# Patient Record
Sex: Female | Born: 1945 | Race: White | Hispanic: No | Smoking: Former smoker
Health system: Southern US, Community
[De-identification: ages and names within clinical notes are randomized; demographics above are authoritative.]

## PROBLEM LIST (undated history)

## (undated) DIAGNOSIS — I442 Atrioventricular block, complete: Secondary | ICD-10-CM

## (undated) DIAGNOSIS — K922 Gastrointestinal hemorrhage, unspecified: Secondary | ICD-10-CM

## (undated) DIAGNOSIS — C642 Malignant neoplasm of left kidney, except renal pelvis: Secondary | ICD-10-CM

## (undated) DIAGNOSIS — I213 ST elevation (STEMI) myocardial infarction of unspecified site: Secondary | ICD-10-CM

## (undated) DIAGNOSIS — K921 Melena: Secondary | ICD-10-CM

## (undated) DIAGNOSIS — I255 Ischemic cardiomyopathy: Secondary | ICD-10-CM

## (undated) DIAGNOSIS — M1990 Unspecified osteoarthritis, unspecified site: Secondary | ICD-10-CM

## (undated) DIAGNOSIS — Z950 Presence of cardiac pacemaker: Secondary | ICD-10-CM

## (undated) HISTORY — DX: Melena: K92.1

## (undated) HISTORY — PX: CHOLECYSTECTOMY: SHX55

## (undated) HISTORY — DX: ST elevation (STEMI) myocardial infarction of unspecified site: I21.3

## (undated) HISTORY — DX: Presence of cardiac pacemaker: Z95.0

## (undated) HISTORY — PX: CORONARY ARTERY BYPASS GRAFT: SHX141

## (undated) HISTORY — PX: PACEMAKER INSERTION: SHX728

## (undated) HISTORY — DX: Malignant neoplasm of left kidney, except renal pelvis: C64.2

## (undated) HISTORY — DX: Atrioventricular block, complete: I44.2

## (undated) HISTORY — DX: Unspecified osteoarthritis, unspecified site: M19.90

## (undated) HISTORY — DX: Gastrointestinal hemorrhage, unspecified: K92.2

## (undated) HISTORY — DX: Ischemic cardiomyopathy: I25.5

---

## 2014-02-25 DIAGNOSIS — N2889 Other specified disorders of kidney and ureter: Secondary | ICD-10-CM | POA: Insufficient documentation

## 2014-02-25 DIAGNOSIS — I10 Essential (primary) hypertension: Secondary | ICD-10-CM | POA: Insufficient documentation

## 2015-03-26 HISTORY — PX: TONSILLECTOMY AND ADENOIDECTOMY: SHX28

## 2018-02-11 DIAGNOSIS — E1169 Type 2 diabetes mellitus with other specified complication: Secondary | ICD-10-CM | POA: Insufficient documentation

## 2019-02-15 DIAGNOSIS — I824Y3 Acute embolism and thrombosis of unspecified deep veins of proximal lower extremity, bilateral: Secondary | ICD-10-CM

## 2019-02-15 DIAGNOSIS — I2510 Atherosclerotic heart disease of native coronary artery without angina pectoris: Secondary | ICD-10-CM | POA: Insufficient documentation

## 2019-02-15 DIAGNOSIS — E782 Mixed hyperlipidemia: Secondary | ICD-10-CM | POA: Insufficient documentation

## 2019-02-15 DIAGNOSIS — I82409 Acute embolism and thrombosis of unspecified deep veins of unspecified lower extremity: Secondary | ICD-10-CM

## 2019-02-15 DIAGNOSIS — I10 Essential (primary) hypertension: Secondary | ICD-10-CM

## 2019-02-15 DIAGNOSIS — E785 Hyperlipidemia, unspecified: Secondary | ICD-10-CM

## 2019-02-15 DIAGNOSIS — I2699 Other pulmonary embolism without acute cor pulmonale: Secondary | ICD-10-CM | POA: Insufficient documentation

## 2019-02-15 DIAGNOSIS — Z8673 Personal history of transient ischemic attack (TIA), and cerebral infarction without residual deficits: Secondary | ICD-10-CM

## 2019-02-15 HISTORY — DX: Acute embolism and thrombosis of unspecified deep veins of unspecified lower extremity: I82.409

## 2019-02-15 HISTORY — DX: Hyperlipidemia, unspecified: E78.5

## 2019-02-15 HISTORY — DX: Personal history of transient ischemic attack (TIA), and cerebral infarction without residual deficits: Z86.73

## 2019-02-15 HISTORY — DX: Essential (primary) hypertension: I10

## 2019-02-15 HISTORY — DX: Atherosclerotic heart disease of native coronary artery without angina pectoris: I25.10

## 2019-02-15 HISTORY — DX: Other pulmonary embolism without acute cor pulmonale: I26.99

## 2019-02-16 ENCOUNTER — Encounter: Payer: Self-pay | Admitting: Internal Medicine

## 2019-02-16 ENCOUNTER — Encounter (INDEPENDENT_AMBULATORY_CARE_PROVIDER_SITE_OTHER): Payer: Self-pay

## 2019-02-16 ENCOUNTER — Other Ambulatory Visit: Payer: Self-pay

## 2019-02-16 ENCOUNTER — Other Ambulatory Visit: Payer: Self-pay | Admitting: Internal Medicine

## 2019-02-16 ENCOUNTER — Ambulatory Visit (INDEPENDENT_AMBULATORY_CARE_PROVIDER_SITE_OTHER): Payer: Medicare Other | Admitting: Internal Medicine

## 2019-02-16 DIAGNOSIS — Z950 Presence of cardiac pacemaker: Secondary | ICD-10-CM | POA: Diagnosis not present

## 2019-02-16 DIAGNOSIS — I442 Atrioventricular block, complete: Secondary | ICD-10-CM | POA: Insufficient documentation

## 2019-02-16 MED ORDER — METOPROLOL SUCCINATE ER 50 MG PO TB24: 50.0000 mg | ORAL_TABLET | Freq: Every day | ORAL | 11 refills | Status: DC

## 2019-02-16 MED ORDER — ROSUVASTATIN CALCIUM 40 MG PO TABS: 40.0000 mg | ORAL_TABLET | Freq: Every day | ORAL | 11 refills | Status: DC

## 2019-02-16 MED ORDER — CLOPIDOGREL BISULFATE 75 MG PO TABS: 75.0000 mg | ORAL_TABLET | Freq: Every day | ORAL | 11 refills | Status: DC

## 2019-02-16 MED ORDER — EZETIMIBE 10 MG PO TABS: 10.0000 mg | ORAL_TABLET | Freq: Every day | ORAL | 11 refills | Status: DC

## 2019-02-16 NOTE — Telephone Encounter (Signed)
Pt's medications were sent to pt's pharmacy as requested. Confirmation received.  

## 2019-02-16 NOTE — Patient Instructions (Signed)
Medication Instructions:  Your physician recommends that you continue on your current medications as directed. Please refer to the Current Medication list given to you today.  Labwork: None ordered.  Testing/Procedures: None ordered.  Follow-Up: Your physician wants you to follow-up in: one year with Dr. Lovena Le.   You will receive a reminder letter in the mail two months in advance. If you don't receive a letter, please call our office to schedule the follow-up appointment.  Remote monitoring is used to monitor your Pacemaker from home. This monitoring reduces the number of office visits required to check your device to one time per year. It allows [place] to keep an eye on the functioning of your device to ensure it is working properly. You are scheduled for a device check from home on 05/18/2019. You may send your transmission at any time that day. If you have a wireless device, the transmission will be sent automatically. After your physician reviews your transmission, you will receive a postcard with your next transmission date.  Any Other Special Instructions Will Be Listed Below (If Applicable).  If you need a refill on your cardiac medications before your next appointment, please call your pharmacy.

## 2019-02-16 NOTE — Progress Notes (Signed)
HPI April Lowery is referred today for ongoing PM followup. She has CAD, s/p MI, with LV dysfunction. She has moved from [place] to [place] to "get out of [place]." Her husband died in Jun 01, 2022 and her son in June. She lives with her son and daughter in law in [place]. She denies chest pain or sob. No edema. No syncope.  Allergies  Allergen Reactions  . Penicillins      Current Outpatient Medications  Medication Sig Dispense Refill  . acetaminophen (TYLENOL) 650 MG CR tablet Take 650 mg by mouth every 8 (eight) hours as needed for pain.    . Ascorbic Acid (VITAMIN C PO) Take 1 Dose by mouth daily.    . cholecalciferol (VITAMIN D3) 25 MCG (1000 UT) tablet Take 1,000 Units by mouth daily.    . clopidogrel (PLAVIX) 75 MG tablet Take 75 mg by mouth daily.    . Cyanocobalamin (B-12) 2500 MCG TABS Take 1 tablet by mouth daily.    Marland Kitchen ezetimibe (ZETIA) 10 MG tablet Take 10 mg by mouth daily.    Marland Kitchen lisinopril (ZESTRIL) 5 MG tablet Take 5 mg by mouth daily.    . metoprolol succinate (TOPROL-XL) 50 MG 24 hr tablet Take 50 mg by mouth daily. Take with or immediately following a meal.    . omeprazole (PRILOSEC) 20 MG capsule Take 20 mg by mouth daily.    . rosuvastatin (CRESTOR) 40 MG tablet Take 40 mg by mouth daily.    . sertraline (ZOLOFT) 50 MG tablet Take 50 mg by mouth daily.     No current facility-administered medications for this visit.      Past Medical History:  Diagnosis Date  . Acute ST segment elevation myocardial infarction ([place])   . CAD (coronary artery disease) 02/15/2019  . Cancer of left kidney ([place])   . Complete atrioventricular block (H[facility])   . DVT (deep venous thrombosis) ([place]) 02/15/2019  . GI bleeding   . 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 HLD (hyperlipidemia) 02/15/2019  . HTN (hypertension) 02/15/2019  . Ischemic cardiomyopathy   . Melena   . Pacemaker   . Pulmonary thromboembolism ([facility] [facility]) 02/15/2019    ROS:   All systems reviewed and negative  except as noted in the HPI.   Past Surgical History:  Procedure Laterality Date  . CHOLECYSTECTOMY    . CORONARY ARTERY BYPASS GRAFT     STENTS  . PACEMAKER INSERTION       Family History  Problem Relation Age of Onset  . Heart disease Mother   . Heart attack Mother   . Heart disease Father   . Heart attack Father   . Heart disease Brother      Social History   Socioeconomic History  . Marital status: Widowed    Spouse name: Not on file  . Number of children: Not on file  . Years of education: Not on file  . Highest education level: Not on file  Occupational History  . Not on file  Social Needs  . Financial resource strain: Not on file  . Food insecurity    Worry: Not on file    Inability: Not on file  . Transportation needs    Medical: Not on file    Non-medical: Not on file  Tobacco Use  . Smoking status: Never Smoker  . Smokeless tobacco: Never Used  Substance and Sexual Activity  . Alcohol use: Never    Frequency: Never  . Drug  use: Never  . Sexual activity: Not on file    Comment: MARRIED  Lifestyle  . Physical activity    Days per week: Not on file    Minutes per session: Not on file  . Stress: Not on file  Relationships  . Social Herbalist on phone: Not on file    Gets together: Not on file    Attends religious service: Not on file    Active member of club or organization: Not on file    Attends meetings of clubs or organizations: Not on file    Relationship status: Not on file  . Intimate partner violence    Fear of current or ex partner: Not on file    Emotionally abused: Not on file    Physically abused: Not on file    Forced sexual activity: Not on file  Other Topics Concern  . Not on file  Social History Narrative  . Not on file     BP 136/64   Pulse 67   Ht 5\' 5"  (1.651 m)   Wt 170 lb (77.1 kg)   SpO2 92%   BMI 28.29 kg/m   Physical Exam:  Well appearing NAD HEENT: Unremarkable Neck:  No JVD, no thyromegally  Lymphatics:  No adenopathy Back:  No CVA tenderness Lungs:  Clear HEART:  Regular rate rhythm, no murmurs, no rubs, no clicks Abd:  soft, positive bowel sounds, no organomegally, no rebound, no guarding Ext:  2 plus pulses, no edema, no cyanosis, no clubbing Skin:  No rashes no nodules Neuro:  CN II through XII intact, motor grossly intact  EKG - nsr with inferior MI  DEVICE  Normal device function.  See PaceArt for details.   Assess/Plan: 1. Sinus node dysfunction/CHB - she is conducting normally and pacing minimally. She is asymptomatic. 2. 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 We will recheck in several months. 3. CAD - she is s/p MI. She has moderate LV dysfunction. She has no CHF symptoms. She is encouraged to remain active. She will call if she has more symptoms. 4. HTN - Her SBPis reasonably well controlled. We will follow. I discussed the importance of a low sodium diet.   Mikle Bosworth.D.

## 2019-02-23 ENCOUNTER — Other Ambulatory Visit: Payer: Self-pay

## 2019-02-24 ENCOUNTER — Encounter: Payer: Self-pay | Admitting: Family Medicine

## 2019-02-24 ENCOUNTER — Ambulatory Visit (INDEPENDENT_AMBULATORY_CARE_PROVIDER_SITE_OTHER): Payer: Medicare Other | Admitting: Family Medicine

## 2019-02-24 VITALS — BP 110/60 | HR 60 | Temp 97.8°F | Resp 16 | Ht 65.0 in | Wt 174.1 lb

## 2019-02-24 DIAGNOSIS — M25512 Pain in left shoulder: Secondary | ICD-10-CM

## 2019-02-24 DIAGNOSIS — I2510 Atherosclerotic heart disease of native coronary artery without angina pectoris: Secondary | ICD-10-CM

## 2019-02-24 DIAGNOSIS — E785 Hyperlipidemia, unspecified: Secondary | ICD-10-CM

## 2019-02-24 DIAGNOSIS — F419 Anxiety disorder, unspecified: Secondary | ICD-10-CM

## 2019-02-24 DIAGNOSIS — I10 Essential (primary) hypertension: Secondary | ICD-10-CM | POA: Diagnosis not present

## 2019-02-24 NOTE — Progress Notes (Signed)
HPI:   [name] is a 73 y.o. female, who is here today to establish care.  Former PCP: Recently moved from [place]. Last preventive routine visit: Around 6 months ago.  Chronic medical problems: Complete heart block s/p pacemaker placement.  He follows with cardiology, Dr. Lovena Le. Vit D and B12 def, left renal cancer, s/p nephrectomy. PE x 1 in 2018 after surgery.  Upper GI bleed, she is on Omeprazole 20 mg daily.  Anxiety: She has been on Sertraline 50 mg daily for about 2 years. Denies depressed mood or suicidal thoughts. Husband died 2 years ago and one of ehr sons in 04/2018. She lives with her son.  B12 deficiency,she is on B12 supplementations 2500 mcg daily. Vit D deficiency on Vit D 1000 U daily.   HTN: Dx'ed 30+ years ago. Denies severe/frequent headache, visual changes, chest pain, dyspnea, palpitation, claudication, focal weakness, or edema. + CAD, she is on Plavix 75 mg daily.  She is on Lisinopril 5 mg and Metoprolol Succinate 50 mg daily.  HLD: Last FLP about 2 months ago. She is on Zetia 10 mg daily and Crestor 40 mg daily. Tolerating medication well.  For about 2-3 months she has had soreness of left sterno clavicular joint. No hx of tx.  Pain is intermittent. Sometimes exacerbated by palpation. No edema or erythema. Pain is mild. She doe snot take OTC medications.  Review of Systems  Constitutional: Negative for activity change, appetite change and fatigue.  HENT: Negative for mouth sores, nosebleeds and sore throat.   Eyes: Negative for redness and visual disturbance.  Respiratory: Negative for cough and wheezing.   Gastrointestinal: Negative for abdominal pain, nausea and vomiting.       Negative for changes in bowel habits.  Genitourinary: Negative for decreased urine volume and hematuria.  Musculoskeletal: Negative for gait problem and myalgias.  Skin: Negative for rash and wound.  Neurological: Negative for syncope and facial  asymmetry.  Psychiatric/Behavioral: Negative for confusion and hallucinations.  Rest see pertinent positives and negatives per HPI.   Current Outpatient Medications on File Prior to Visit  Medication Sig Dispense Refill  . acetaminophen (TYLENOL) 650 MG CR tablet Take 650 mg by mouth every 8 (eight) hours as needed for pain.    . Ascorbic Acid (VITAMIN C PO) Take 1 Dose by mouth daily.    . cholecalciferol (VITAMIN D3) 25 MCG (1000 UT) tablet Take 1,000 Units by mouth daily.    . clopidogrel (PLAVIX) 75 MG tablet Take 1 tablet (75 mg total) by mouth daily. 30 tablet 11  . Cyanocobalamin (B-12) 2500 MCG TABS Take 1 tablet by mouth daily.    Marland Kitchen ezetimibe (ZETIA) 10 MG tablet Take 1 tablet (10 mg total) by mouth daily. 30 tablet 11  . lisinopril (ZESTRIL) 5 MG tablet Take 5 mg by mouth daily.    . metoprolol succinate (TOPROL-XL) 50 MG 24 hr tablet Take 1 tablet (50 mg total) by mouth daily. Take with or immediately following a meal. 30 tablet 11  . omeprazole (PRILOSEC) 20 MG capsule Take 20 mg by mouth daily.    . rosuvastatin (CRESTOR) 40 MG tablet Take 1 tablet (40 mg total) by mouth daily. 30 tablet 11  . sertraline (ZOLOFT) 50 MG tablet Take 50 mg by mouth daily.     No current facility-administered medications on file prior to visit.    Past Medical History:  Diagnosis Date  . Acute ST segment elevation myocardial infarction ([place] [facility])   .  Arthritis   . CAD (coronary artery disease) 02/15/2019  . Cancer of left kidney ([facility])   . Complete atrioventricular block (H[facility])   . DVT (deep venous thrombosis) ([facility]) 02/15/2019  . GI bleeding   . 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 HLD (hyperlipidemia) 02/15/2019  . HTN (hypertension) 02/15/2019  . Ischemic cardiomyopathy   . Melena   . Pacemaker   . Pulmonary thromboembolism ([facility]) 02/15/2019   Allergies  Allergen Reactions  . Penicillins     Family History  Problem Relation Age of Onset  . Heart disease Mother   . Heart attack  Mother   . Heart disease Father   . Heart attack Father   . Heart disease Brother   . Heart attack Brother   . Heart attack Maternal Grandmother   . Heart attack Maternal Grandfather   . Heart attack Paternal Grandmother   . Heart attack Paternal Grandfather     Social History   Socioeconomic History  . Marital status: Widowed    Spouse name: Not on file  . Number of children: 4  . Years of education: Not on file  . Highest education level: Not on file  Occupational History  . Not on file  Social Needs  . Financial resource strain: Not on file  . Food insecurity    Worry: Not on file    Inability: Not on file  . Transportation needs    Medical: Not on file    Non-medical: Not on file  Tobacco Use  . Smoking status: Former Research scientist (life sciences)  . Smokeless tobacco: Never Used  Substance and Sexual Activity  . Alcohol use: Never    Frequency: Never  . Drug use: Never  . Sexual activity: Not on file    Comment: MARRIED  Lifestyle  . Physical activity    Days per week: Not on file    Minutes per session: Not on file  . Stress: Not on file  Relationships  . Social Herbalist on phone: Not on file    Gets together: Not on file    Attends religious service: Not on file    Active member of club or organization: Not on file    Attends meetings of clubs or organizations: Not on file    Relationship status: Not on file  Other Topics Concern  . Not on file  Social History Narrative  . Not on file    Vitals:   02/24/19 0929  BP: 110/60  Pulse: 60  Resp: 16  Temp: 97.8 F (36.6 C)  SpO2: 97%    Body mass index is 28.98 kg/m.  Physical Exam  Nursing note and vitals reviewed. Constitutional: She is oriented to person, place, and time. She appears well-developed. No distress.  HENT:  Head: Normocephalic and atraumatic.  Mouth/Throat: Oropharynx is clear and moist and mucous membranes are normal.  Eyes: Pupils are equal, round, and reactive to light. Conjunctivae  are normal.  Cardiovascular: Normal rate and regular rhythm.  No murmur heard. Pulses:      Dorsalis pedis pulses are 2+ on the right side and 2+ on the left side.  Respiratory: Effort normal and breath sounds normal. No respiratory distress.  GI: Soft. She exhibits no mass. There is no hepatomegaly. There is no abdominal tenderness.  Musculoskeletal:        General: No edema.  Lymphadenopathy:    She has no cervical adenopathy.  Neurological: She is alert and oriented to person, place, and  time. She has normal strength. No cranial nerve deficit. Gait normal.  Skin: Skin is warm. No rash noted. No erythema.  Psychiatric: She has a normal mood and affect.  Well groomed, good eye contact.    ASSESSMENT AND PLAN:  Ms.Serria was seen today for establish care.  Diagnoses and all orders for this visit:  Sternoclavicular joint pain, left Hx and examination today do not suggest a serious process. ?  OA. She prefers to hold on imaging for now. Continue monitoring for changes.  Essential hypertension BP adequately controlled. No changes in current management. Continue low sodium diet. Monitor BP regularly.  Hyperlipidemia, unspecified hyperlipidemia type LDL goal < 70. Continue Crestor 40 mg daily and low fat diet.  Coronary artery disease involving native coronary artery of native heart, angina presence unspecified Asymptomatic. Continue Plavix 75 mg daily ,Crestor 40 mg,and Metoprolol Succinate 50 mg daily. Instructed about warning signs.  Continue following with Dr Malachi Carl appt 04/2019.  Anxiety disorder, unspecified type Problem is well controlled. Continue Zoloft 50 mg daily.   Return in about 6 months (around 08/25/2019) for HTN,HLD. Relise form to be signed..     Linsie Lupo G. Martinique, MD  [place]. [place] office.

## 2019-02-24 NOTE — Patient Instructions (Signed)
A few things to remember from today's visit:   Essential hypertension  Hyperlipidemia, unspecified hyperlipidemia type  Coronary artery disease involving native coronary artery of native heart, angina presence unspecified  Anxiety disorder, unspecified type  No changes in your medications today. Please sign a release form before you leave so we can get your records. I will see her back in 6 months. It was nice to meet you!   Please be sure medication list is accurate. If a new problem present, please set up appointment sooner than planned today.

## 2019-03-09 ENCOUNTER — Ambulatory Visit (INDEPENDENT_AMBULATORY_CARE_PROVIDER_SITE_OTHER): Payer: Medicare Other | Admitting: *Deleted

## 2019-03-09 DIAGNOSIS — Z950 Presence of cardiac pacemaker: Secondary | ICD-10-CM

## 2019-03-09 LAB — CUP PACEART REMOTE DEVICE CHECK
Battery Remaining Longevity: 166 mo
Battery Voltage: 3.07 V
Brady Statistic AP VP Percent: 5.12 %
Brady Statistic AP VS Percent: 20.88 %
Brady Statistic AS VP Percent: 0.04 %
Brady Statistic AS VS Percent: 73.96 %
Brady Statistic RA Percent Paced: 26.06 %
Brady Statistic RV Percent Paced: 5.16 %
Date Time Interrogation Session: 20201214223401
Implantable Lead Implant Date: 20191121
Implantable Lead Implant Date: 20191121
Implantable Lead Location: 753859
Implantable Lead Location: 753860
Implantable Lead Model: 4076
Implantable Lead Model: 4076
Implantable Pulse Generator Implant Date: 20191121
Lead Channel Impedance Value: 304 Ohm
Lead Channel Impedance Value: 399 Ohm
Lead Channel Impedance Value: 456 Ohm
Lead Channel Impedance Value: 513 Ohm
Lead Channel Pacing Threshold Amplitude: 0.75 V
Lead Channel Pacing Threshold Amplitude: 0.75 V
Lead Channel Pacing Threshold Pulse Width: 0.4 ms
Lead Channel Pacing Threshold Pulse Width: 0.4 ms
Lead Channel Sensing Intrinsic Amplitude: 2 mV
Lead Channel Sensing Intrinsic Amplitude: 2 mV
Lead Channel Sensing Intrinsic Amplitude: 20.875 mV
Lead Channel Sensing Intrinsic Amplitude: 20.875 mV
Lead Channel Setting Pacing Amplitude: 1.5 V
Lead Channel Setting Pacing Amplitude: 2 V
Lead Channel Setting Pacing Pulse Width: 0.4 ms
Lead Channel Setting Sensing Sensitivity: 1.2 mV

## 2019-03-17 ENCOUNTER — Encounter: Payer: Self-pay | Admitting: Family Medicine

## 2019-03-17 DIAGNOSIS — Z85528 Personal history of other malignant neoplasm of kidney: Secondary | ICD-10-CM | POA: Insufficient documentation

## 2019-04-01 LAB — CUP PACEART INCLINIC DEVICE CHECK
Date Time Interrogation Session: 20201124143214
Implantable Lead Implant Date: 20191121
Implantable Lead Implant Date: 20191121
Implantable Lead Location: 753859
Implantable Lead Location: 753860
Implantable Lead Model: 4076
Implantable Lead Model: 4076
Implantable Pulse Generator Implant Date: 20191121

## 2019-04-10 NOTE — Progress Notes (Signed)
PPM remote 

## 2019-06-08 ENCOUNTER — Ambulatory Visit (INDEPENDENT_AMBULATORY_CARE_PROVIDER_SITE_OTHER): Payer: Medicare Other | Admitting: *Deleted

## 2019-06-08 DIAGNOSIS — Z950 Presence of cardiac pacemaker: Secondary | ICD-10-CM | POA: Diagnosis not present

## 2019-06-08 LAB — CUP PACEART REMOTE DEVICE CHECK
Battery Remaining Longevity: 160 mo
Battery Voltage: 3.05 V
Brady Statistic AP VP Percent: 4.24 %
Brady Statistic AP VS Percent: 16.04 %
Brady Statistic AS VP Percent: 0.16 %
Brady Statistic AS VS Percent: 79.56 %
Brady Statistic RA Percent Paced: 20.32 %
Brady Statistic RV Percent Paced: 4.39 %
Date Time Interrogation Session: 20210316064119
Implantable Lead Implant Date: 20191121
Implantable Lead Implant Date: 20191121
Implantable Lead Location: 753859
Implantable Lead Location: 753860
Implantable Lead Model: 4076
Implantable Lead Model: 4076
Implantable Pulse Generator Implant Date: 20191121
Lead Channel Impedance Value: 323 Ohm
Lead Channel Impedance Value: 399 Ohm
Lead Channel Impedance Value: 418 Ohm
Lead Channel Impedance Value: 456 Ohm
Lead Channel Pacing Threshold Amplitude: 0.625 V
Lead Channel Pacing Threshold Amplitude: 0.75 V
Lead Channel Pacing Threshold Pulse Width: 0.4 ms
Lead Channel Pacing Threshold Pulse Width: 0.4 ms
Lead Channel Sensing Intrinsic Amplitude: 1.875 mV
Lead Channel Sensing Intrinsic Amplitude: 1.875 mV
Lead Channel Sensing Intrinsic Amplitude: 17.125 mV
Lead Channel Sensing Intrinsic Amplitude: 17.125 mV
Lead Channel Setting Pacing Amplitude: 2 V
Lead Channel Setting Pacing Amplitude: 2 V
Lead Channel Setting Pacing Pulse Width: 0.4 ms
Lead Channel Setting Sensing Sensitivity: 1.2 mV

## 2019-06-09 NOTE — Progress Notes (Signed)
PPM Remote  

## 2019-06-10 ENCOUNTER — Encounter: Payer: Self-pay | Admitting: Family Medicine

## 2019-07-07 ENCOUNTER — Encounter: Payer: Self-pay | Admitting: Family Medicine

## 2019-09-07 ENCOUNTER — Ambulatory Visit (INDEPENDENT_AMBULATORY_CARE_PROVIDER_SITE_OTHER): Payer: Medicare Other | Admitting: *Deleted

## 2019-09-07 DIAGNOSIS — I442 Atrioventricular block, complete: Secondary | ICD-10-CM | POA: Diagnosis not present

## 2019-09-08 LAB — CUP PACEART REMOTE DEVICE CHECK
Battery Remaining Longevity: 158 mo
Battery Voltage: 3.04 V
Brady Statistic AP VP Percent: 7.39 %
Brady Statistic AP VS Percent: 18.07 %
Brady Statistic AS VP Percent: 4.41 %
Brady Statistic AS VS Percent: 70.13 %
Brady Statistic RA Percent Paced: 25.48 %
Brady Statistic RV Percent Paced: 11.8 %
Date Time Interrogation Session: 20210615041901
Implantable Lead Implant Date: 20191121
Implantable Lead Implant Date: 20191121
Implantable Lead Location: 753859
Implantable Lead Location: 753860
Implantable Lead Model: 4076
Implantable Lead Model: 4076
Implantable Pulse Generator Implant Date: 20191121
Lead Channel Impedance Value: 304 Ohm
Lead Channel Impedance Value: 380 Ohm
Lead Channel Impedance Value: 456 Ohm
Lead Channel Impedance Value: 551 Ohm
Lead Channel Pacing Threshold Amplitude: 0.75 V
Lead Channel Pacing Threshold Amplitude: 1 V
Lead Channel Pacing Threshold Pulse Width: 0.4 ms
Lead Channel Pacing Threshold Pulse Width: 0.4 ms
Lead Channel Sensing Intrinsic Amplitude: 18.25 mV
Lead Channel Sensing Intrinsic Amplitude: 18.25 mV
Lead Channel Sensing Intrinsic Amplitude: 2 mV
Lead Channel Sensing Intrinsic Amplitude: 2 mV
Lead Channel Setting Pacing Amplitude: 2 V
Lead Channel Setting Pacing Amplitude: 2 V
Lead Channel Setting Pacing Pulse Width: 0.4 ms
Lead Channel Setting Sensing Sensitivity: 1.2 mV

## 2019-09-08 NOTE — Progress Notes (Signed)
Remote pacemaker transmission.   

## 2019-09-09 ENCOUNTER — Other Ambulatory Visit: Payer: Self-pay | Admitting: Family Medicine

## 2019-09-10 NOTE — Telephone Encounter (Signed)
Pt needs to keep the appt. 09/29/2019 for further refills.

## 2019-09-27 NOTE — Progress Notes (Signed)
Cardiology Office Note Date:  09/28/2019  Patient ID:  April Lowery, April Lowery [birth year], MRN [id number] PCP:  Martinique, Betty G, MD  Electrophysiologist:  Dr. Lovena Le    Chief Complaint:  wants check in prior to travel to [place]  History of Present Illness: April Lowery is a 74 y.o. female with history of CAD (multiple prior stents > CABG at 74y/o> last PCI/MI was Nov 2019), ICM, CHB w/PPM, renal cancer (treated surgically with resection of tumor only), DVT/PE.  She comes in today to be seen for Dr. Lovena Le, last seen by him (and her 1st visit) after [place] from [place] to live with her son after her husband died.  She was doing well, noted to have mod LV dysfunction, no symptoms, no changes were made.  She feels great.  She has some degree of DOE at her baseline though walks regularly for exercise, mostly on trails 3-38miles at least once/week.  She just returned from [place], did a lot of outdoor activities, including trails and had no exertional intolerances.  She has no difficulties with her ADLs No CP, palpitations or cardiac awareness.  She will occassionally feel a fleeting pulling at her pacemaker with some movements.  No near syncope or syncope. No rest SOB   Device information MDT dual chamber PPM implanted11/21/2019   Past Medical History:  Diagnosis Date  . Acute ST segment elevation myocardial infarction ([facility])   . Arthritis   . CAD (coronary artery disease) 02/15/2019  . Cancer of left kidney ([facility])   . Complete atrioventricular block ([facility])   . DVT (deep venous thrombosis) ([facility]) 02/15/2019  . GI bleeding   . 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 HLD (hyperlipidemia) 02/15/2019  . HTN (hypertension) 02/15/2019  . Ischemic cardiomyopathy   . Melena   . Pacemaker   . Pulmonary thromboembolism ([place]) 02/15/2019    Past Surgical History:  Procedure Laterality Date  . CHOLECYSTECTOMY    . CORONARY ARTERY BYPASS GRAFT     STENTS  . PACEMAKER INSERTION    .  TONSILLECTOMY AND ADENOIDECTOMY  2017    Current Outpatient Medications  Medication Sig Dispense Refill  . acetaminophen (TYLENOL) 650 MG CR tablet Take 650 mg by mouth every 8 (eight) hours as needed for pain.    . Ascorbic Acid (VITAMIN C PO) Take 1 Dose by mouth daily.    . cholecalciferol (VITAMIN D3) 25 MCG (1000 UT) tablet Take 1,000 Units by mouth daily.    . clopidogrel (PLAVIX) 75 MG tablet Take 1 tablet (75 mg total) by mouth daily. 30 tablet 11  . Cyanocobalamin (B-12) 2500 MCG TABS Take 1 tablet by mouth daily.    Marland Kitchen ezetimibe (ZETIA) 10 MG tablet Take 1 tablet (10 mg total) by mouth daily. 30 tablet 11  . lisinopril (ZESTRIL) 5 MG tablet Take 5 mg by mouth daily.    . metoprolol succinate (TOPROL-XL) 50 MG 24 hr tablet Take 1 tablet (50 mg total) by mouth daily. Take with or immediately following a meal. 30 tablet 11  . omeprazole (PRILOSEC) 20 MG capsule Take 20 mg by mouth daily.    . rosuvastatin (CRESTOR) 40 MG tablet Take 1 tablet (40 mg total) by mouth daily. 30 tablet 11  . sertraline (ZOLOFT) 50 MG tablet TAKE 1 TABLET BY MOUTH EVERY DAY 30 tablet 0   No current facility-administered medications for this visit.    Allergies:   Penicillins   Social History:  The patient  reports that  she has quit smoking. She has never used smokeless tobacco. She reports that she does not drink alcohol and does not use drugs.   Family History:  The patient's family history includes Heart attack in her brother, father, maternal grandfather, maternal grandmother, mother, paternal grandfather, and paternal grandmother; Heart disease in her brother, father, and mother.  ROS:  Please see the history of present illness.  All other systems are reviewed and otherwise negative.   PHYSICAL EXAM:  VS:  BP (!) 128/56   Pulse 60   Ht 5\' 5"  (1.651 m)   Wt 181 lb (82.1 kg)   SpO2 96%   BMI 30.12 kg/m  BMI: Body mass index is 30.12 kg/m. Well nourished, well developed, in no acute distress    HEENT: normocephalic, atraumatic  Neck: no JVD, carotid bruits or masses Cardiac: RRR; no significant murmurs, no rubs, or gallops Lungs:  CTA b/l, no wheezing, rhonchi or rales  Abd: soft, nontender MS: no deformity or atrophy Ext: no edema  Skin: warm and dry, no rash Neuro:  No gross deficits appreciated Psych: euthymic mood, full affect  PPM site is stable, no tethering or discomfort   EKG:  Not done today  PPM interrogation done today and reviewed by myself:  Battery and lead measurements are good No arrhythmias AP 23.6% VP 8.2% No programming changes were made   02/10/2018: TTE LVEF 25-30%, + WMA trace MR   Recent Labs: No results found for requested labs within last 8760 hours.  No results found for requested labs within last 8760 hours.   CrCl cannot be calculated (No successful lab value found.).   Wt Readings from Last 3 Encounters:  09/28/19 181 lb (82.1 kg)  02/24/19 174 lb 2 oz (79 kg)  02/16/19 170 lb (77.1 kg)     Other studies reviewed: Additional studies/records reviewed today include: summarized above  ASSESSMENT AND PLAN:  1. PPM     Intact function, no programming changes made  2. CAD     No symptoms of her angina (she mentions never has had CP, always has been an  oprresive SOB associated with her MI's)      She sees her PMD tomorrow planned for labs     On plavix, BB, statin  3. ICM     No symptoms or exam findings to suggest volume OL     On BB/ACE     Her echo in 2019 was at the time of an MI     Update echo  4. HTN     Looks good, no changes  5. HLD     Labs tomorrow with PMD     pn statin and zetia as well   Discussed cardiac health, exercise, healthy eating, regular labs/cholesterol checks.    Disposition: F/u with remotes Q 28mo, and in clinic in 1 year, sooner if needed  Current medicines are reviewed at length with the patient today.  The patient did not have any concerns regarding medicines.  Venetia Night, PA-C 09/28/2019 10:49 AM     [facility] [address] [place] [address] [phone] (office)  [phone] (fax)

## 2019-09-28 ENCOUNTER — Ambulatory Visit (INDEPENDENT_AMBULATORY_CARE_PROVIDER_SITE_OTHER): Payer: Medicare Other | Admitting: Physician Assistant

## 2019-09-28 ENCOUNTER — Other Ambulatory Visit: Payer: Self-pay

## 2019-09-28 VITALS — BP 128/56 | HR 60 | Ht 65.0 in | Wt 181.0 lb

## 2019-09-28 DIAGNOSIS — E785 Hyperlipidemia, unspecified: Secondary | ICD-10-CM

## 2019-09-28 DIAGNOSIS — I2510 Atherosclerotic heart disease of native coronary artery without angina pectoris: Secondary | ICD-10-CM

## 2019-09-28 DIAGNOSIS — I10 Essential (primary) hypertension: Secondary | ICD-10-CM | POA: Diagnosis not present

## 2019-09-28 DIAGNOSIS — I442 Atrioventricular block, complete: Secondary | ICD-10-CM

## 2019-09-28 DIAGNOSIS — I255 Ischemic cardiomyopathy: Secondary | ICD-10-CM | POA: Diagnosis not present

## 2019-09-28 DIAGNOSIS — Z950 Presence of cardiac pacemaker: Secondary | ICD-10-CM | POA: Diagnosis not present

## 2019-09-28 NOTE — Patient Instructions (Signed)
Medication Instructions:   Your physician recommends that you continue on your current medications as directed. Please refer to the Current Medication list given to you today.   *If you need a refill on your cardiac medications before your next appointment, please call your pharmacy*   Lab Work: [place]    If you have labs (blood work) drawn today and your tests are completely normal, you will receive your results only by: Marland Kitchen MyChart Message (if you have MyChart) OR . A paper copy in the mail If you have any lab test that is abnormal or we need to change your treatment, we will call you to review the results.   Testing/Procedures: Your physician has requested that you have an echocardiogram. Echocardiography is a painless test that uses sound waves to create images of your heart. It provides your doctor with information about the size and shape of your heart and how well your heart's chambers and valves are working. This procedure takes approximately one hour. There are no restrictions for this procedure.   Follow-Up: At [facility], you and your health needs are our priority.  As part of our continuing mission to provide you with exceptional heart care, we have created designated Provider Care Teams.  These Care Teams include your primary Cardiologist (physician) and Advanced Practice Providers (APPs -  Physician Assistants and Nurse Practitioners) who all work together to provide you with the care you need, when you need it.  We recommend signing up for the patient portal called "MyChart".  Sign up information is provided on this After Visit Summary.  MyChart is used to connect with patients for Virtual Visits (Telemedicine).  Patients are able to view lab/test results, encounter notes, upcoming appointments, etc.  Non-urgent messages can be sent to your provider as well.   To learn more about what you can do with MyChart, go to [email or web address].    Your next  appointment:   1 year(s)  The format for your next appointment:   In Person  Provider:   You may see Dr. Lovena Le  or one of the following Advanced Practice Providers on your designated Care Team:    Chanetta Marshall, NP  Tommye Standard, PA-C  [name] "Oda [place]ts, [place]    Other Instructions

## 2019-09-29 ENCOUNTER — Ambulatory Visit (INDEPENDENT_AMBULATORY_CARE_PROVIDER_SITE_OTHER): Payer: Medicare Other | Admitting: Family Medicine

## 2019-09-29 ENCOUNTER — Encounter: Payer: Self-pay | Admitting: Family Medicine

## 2019-09-29 VITALS — BP 120/62 | HR 74 | Temp 97.4°F | Resp 12 | Ht 65.0 in | Wt 181.0 lb

## 2019-09-29 DIAGNOSIS — I255 Ischemic cardiomyopathy: Secondary | ICD-10-CM | POA: Diagnosis not present

## 2019-09-29 DIAGNOSIS — E1169 Type 2 diabetes mellitus with other specified complication: Secondary | ICD-10-CM

## 2019-09-29 DIAGNOSIS — I442 Atrioventricular block, complete: Secondary | ICD-10-CM

## 2019-09-29 DIAGNOSIS — E538 Deficiency of other specified B group vitamins: Secondary | ICD-10-CM

## 2019-09-29 DIAGNOSIS — Z0000 Encounter for general adult medical examination without abnormal findings: Secondary | ICD-10-CM

## 2019-09-29 DIAGNOSIS — Z1239 Encounter for other screening for malignant neoplasm of breast: Secondary | ICD-10-CM

## 2019-09-29 DIAGNOSIS — R251 Tremor, unspecified: Secondary | ICD-10-CM

## 2019-09-29 DIAGNOSIS — F419 Anxiety disorder, unspecified: Secondary | ICD-10-CM

## 2019-09-29 DIAGNOSIS — E785 Hyperlipidemia, unspecified: Secondary | ICD-10-CM

## 2019-09-29 DIAGNOSIS — I10 Essential (primary) hypertension: Secondary | ICD-10-CM

## 2019-09-29 DIAGNOSIS — R739 Hyperglycemia, unspecified: Secondary | ICD-10-CM

## 2019-09-29 NOTE — Progress Notes (Signed)
HPI: April Lowery is a 74 y.o. female, who is here today for AWV and follow up.   She lives with her younger son. Independent ADL's and IADL's. No falls in the past year and denies depression symptoms.  Functional Status Survey: Is the patient deaf or have difficulty hearing?: No Does the patient have difficulty seeing, even when wearing glasses/contacts?: No Does the patient have difficulty concentrating, remembering, or making decisions?: No Does the patient have difficulty walking or climbing stairs?: No Does the patient have difficulty dressing or bathing?: No Does the patient have difficulty doing errands alone such as visiting a doctor's office or shopping?: No  Fall Risk  09/29/2019 02/24/2019  Falls in the past year? 0 0  Number falls in past yr: 0 1  Injury with Fall? 0 0  Follow up Education provided Education provided   Providers she sees regularly: Cardiologist: Dr Lovena Le Eye care provider: She does not have one.  Depression screen [facility] 2/9 09/29/2019  Decreased Interest 0  Down, Depressed, Hopeless 0  PHQ - 2 Score 0    Mini-Cog - 09/30/19 2007    Normal clock drawing test? yes    How many words correct? 3           Hearing Screening   125Hz  250Hz  500Hz  1000Hz  2000Hz  3000Hz  4000Hz  6000Hz  8000Hz   Right ear:           Left ear:             Visual Acuity Screening   Right eye Left eye Both eyes  Without correction:     With correction: 20/25 20/25 20/25     She was last seen on 02/24/19.  HLD: She is on Crestor 40 mg and Zetia 10 mg daily. + CAD s/p PCI/stent placements. She is on Plavix 75 mg daily + CVA. Intermittent complete heart block, s/p pacemaker placement. HTN: She is on Lisinopril 5 mg daily and Metoprolol succinate 50 mg daily. Negative for  severe/frequent headache, visual changes, chest pain, dyspnea, palpitation, claudication, focal weakness, or edema. Negative for orthopnea or PND.  Elevated glucose at 160 in 02/2019. Reviewing  records there is a HgA1C 7.8 in 01/2018.  Negative for polydipsia,polyuria, or polyphagia.  Anxiety: She is on Sertraline 50 mg daily. She has taken medication for 2-3 years.  Negative for depressed mood or suicidal thoughts.  Planning on traveling to [place] in 12/2019 with her sisters.  B12 deficiency: She is on B12 2500 mcg daily.  She is concerned about hands tremor. It is exacerbated by hand activities, mainly writing.  Review of Systems  Constitutional: Negative for activity change, appetite change, fatigue and fever.  HENT: Negative for mouth sores, nosebleeds and trouble swallowing.   Eyes: Negative for redness and visual disturbance.  Respiratory: Negative for cough and wheezing.   Gastrointestinal: Negative for abdominal pain, nausea and vomiting.       Negative for changes in bowel habits.  Genitourinary: Negative for decreased urine volume, dysuria and hematuria.  Neurological: Negative for syncope, weakness and headaches.  Rest of ROS, see pertinent positives sand negatives in HPI  Current Outpatient Medications on File Prior to Visit  Medication Sig Dispense Refill  . acetaminophen (TYLENOL) 650 MG CR tablet Take 650 mg by mouth every 8 (eight) hours as needed for pain.    . Ascorbic Acid (VITAMIN C PO) Take 1 Dose by mouth daily.    . cholecalciferol (VITAMIN D3) 25 MCG (1000 UT) tablet Take 1,000  Units by mouth daily.    . clopidogrel (PLAVIX) 75 MG tablet Take 1 tablet (75 mg total) by mouth daily. 30 tablet 11  . Cyanocobalamin (B-12) 2500 MCG TABS Take 1 tablet by mouth daily.    Marland Kitchen ezetimibe (ZETIA) 10 MG tablet Take 1 tablet (10 mg total) by mouth daily. 30 tablet 11  . lisinopril (ZESTRIL) 5 MG tablet Take 5 mg by mouth daily.    . metoprolol succinate (TOPROL-XL) 50 MG 24 hr tablet Take 1 tablet (50 mg total) by mouth daily. Take with or immediately following a meal. 30 tablet 11  . omeprazole (PRILOSEC) 20 MG capsule Take 20 mg by mouth daily.    .  rosuvastatin (CRESTOR) 40 MG tablet Take 1 tablet (40 mg total) by mouth daily. 30 tablet 11  . sertraline (ZOLOFT) 50 MG tablet TAKE 1 TABLET BY MOUTH EVERY DAY 30 tablet 0   No current facility-administered medications on file prior to visit.    Past Medical History:  Diagnosis Date  . Acute ST segment elevation myocardial infarction ([place])   . Arthritis   . CAD (coronary artery disease) 02/15/2019  . Cancer of left kidney ([place])   . Complete atrioventricular block (HCC)   . DVT (deep venous thrombosis) ([place]) 02/15/2019  . GI bleeding   . 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 HLD (hyperlipidemia) 02/15/2019  . HTN (hypertension) 02/15/2019  . Ischemic cardiomyopathy   . Melena   . Pacemaker   . Pulmonary thromboembolism ([place]) 02/15/2019   Allergies  Allergen Reactions  . Penicillins     Social History   Socioeconomic History  . Marital status: Widowed    Spouse name: Not on file  . Number of children: 4  . Years of education: Not on file  . Highest education level: Not on file  Occupational History  . Not on file  Tobacco Use  . Smoking status: Former Research scientist (life sciences)  . Smokeless tobacco: Never Used  Vaping Use  . Vaping Use: Never used  Substance and Sexual Activity  . Alcohol use: Never  . Drug use: Never  . Sexual activity: Not on file    Comment: MARRIED  Other Topics Concern  . Not on file  Social History Narrative  . Not on file   Social Determinants of Health   Financial Resource Strain:   . Difficulty of Paying Living Expenses:   Food Insecurity:   . Worried About Charity fundraiser in the Last Year:   . Arboriculturist in the Last Year:   Transportation Needs:   . Film/video editor (Medical):   Marland Kitchen Lack of Transportation (Non-Medical):   Physical Activity:   . Days of Exercise per Week:   . Minutes of Exercise per Session:   Stress:   . Feeling of Stress :   Social Connections:   . Frequency of Communication with Friends and Family:   .  Frequency of Social Gatherings with Friends and Family:   . Attends Religious Services:   . Active Member of Clubs or Organizations:   . Attends Archivist Meetings:   Marland Kitchen Marital Status:     Vitals:   09/29/19 1011  BP: 120/62  Pulse: 74  Resp: 12  Temp: (!) 97.4 F (36.3 C)  SpO2: 93%   Body mass index is 30.12 kg/m.  Physical Exam Vitals and nursing note reviewed.  Constitutional:      General: She is not in acute distress.  Appearance: She is well-developed.  HENT:     Head: Normocephalic and atraumatic.     Mouth/Throat:     Mouth: Mucous membranes are moist.     Pharynx: Oropharynx is clear.  Eyes:     Conjunctiva/sclera: Conjunctivae normal.     Pupils: Pupils are equal, round, and reactive to light.  Cardiovascular:     Rate and Rhythm: Normal rate and regular rhythm.     Heart sounds: No murmur heard.      Comments: DP pulses present. Pulmonary:     Effort: Pulmonary effort is normal. No respiratory distress.     Breath sounds: Normal breath sounds.  Abdominal:     Palpations: Abdomen is soft. There is no hepatomegaly or mass.     Tenderness: There is no abdominal tenderness.  Lymphadenopathy:     Cervical: No cervical adenopathy.  Skin:    General: Skin is warm.     Findings: No erythema or rash.  Neurological:     General: No focal deficit present.     Mental Status: She is alert and oriented to person, place, and time.     Cranial Nerves: Cranial nerves are intact. No cranial nerve deficit.     Motor: No tremor.     Gait: Gait normal.  Psychiatric:     Comments: Well groomed, good eye contact.    ASSESSMENT AND PLAN:  April Lowery was seen today for AWV and follow-up.  Orders Placed This Encounter  Procedures  . MM 3D SCREEN BREAST BILATERAL  . Comprehensive metabolic panel  . Lipid panel  . TSH  . Vitamin B12  . Hemoglobin A1c    Medicare annual wellness visit, subsequent We discussed the importance of staying  active, physically and mentally, as well as the benefits of a healthy/balance diet. Low impact exercise that involve stretching and strengthing are ideal. Vaccines up to date. We discussed preventive screening for the next 5-10 years, summery of recommendations given in AVS. Glaucoma screening every 1-2 years.. Fall prevention. DEX Mammogram 2015. Colonoscopy on 09/25/14, she will sign a release form,so we can obtain copy of report. A 2 years ago, reported as normal.She prefers to hold on DEXA for now. Advance directives and end of life discussed, she has POA but needs to update forms because husband and son have died. Advance healthcare directives package given.   Tremor of both hands We discussed possible etiologies. On examination today there is no tremor present. ? Essential tremor. Monitor for new symptoms.  Essential hypertension BP adequately controlled. Continue Lisinopril 5 mg daily and Metoprolol succinate 25 mg daily.  Hyperlipidemia, unspecified hyperlipidemia type Continue Crestor 40 mg daily and Zetia 10 mg daily. Fasting lab appt will be arranged. LDL goal < 70.  Anxiety disorder, unspecified type Stable. For now she refers to continue Sertraline 50 mg.  B12 deficiency Continue B12 supplementation,dose will be adjusted if needed according to B12 result.  Encounter for screening for malignant neoplasm of breast, unspecified screening modality -     MM 3D SCREEN BREAST BILATERAL; Future  Hyperglycemia DM II based on HgA1C in 01/2018. I will add Dx to problem list. Further recommendations according to HgA1C.  Intermittent complete heart block (HCC) S/P pacemaker placement. Follows with cardiologist.  Return in about 6 months (around 03/31/2020) for HTN.   Nethra Mehlberg G. [place], MD  [facility]. [place] office.   Ms. Styles , Thank you for taking time to come for your Medicare Wellness Visit. I  appreciate your ongoing commitment to your health goals.  Please review the following plan we discussed and let me know if I can assist you in the future.   These are the goals we discussed: Goals    . Exercise 3x per week (30 min per time)     Low impact exercise 3-4 times per week. Ideal for you aquatic exercises.  Continue a low fat and low salt diet.       This is a list of the screening recommended for you and due dates:  Health Maintenance  Topic Date Due  . Tetanus Vaccine  Never done  . Mammogram  Never done  . DEXA scan (bone density measurement)  Never done  .  Hepatitis C: One time screening is recommended by Center for Disease Control  (CDC) for  adults born from [birth year] through 1963.   03/16/2020*  . Flu Shot  10/24/2019  . Colon Cancer Screening  09/24/2024  . COVID-19 Vaccine  Completed  . Pneumonia vaccines  Discontinued  *Topic was postponed. The date shown is not the original due date.   Mammogram will be arranged. Bone density next year. Tdap and shingrix vaccine at your pharmacy.  A few tips:  -As we age balance is not as good as it was, so there is a higher risks for falls. Please remove small rugs and furniture that is "in your way" and could increase the risk of falls. Stretching exercises may help with fall prevention: Yoga and Tai Chi are some examples. Low impact exercise is better, so you are not very achy the next day.  -Sun screen and avoidance of direct sun light recommended. Caution with dehydration, if working outdoors be sure to drink enough fluids.  - Some medications are not safe as we age, increases the risk of side effects and can potentially interact with other medication you are also taken;  including some of over the counter medications. Be sure to let me know when you start a new medication even if it is a dietary/vitamin supplement.   -Healthy diet low in red meet/animal fat and sugar + regular physical activity is recommended.

## 2019-09-29 NOTE — Patient Instructions (Signed)
  Ms. Donaghey , Thank you for taking time to come for your Medicare Wellness Visit. I appreciate your ongoing commitment to your health goals. Please review the following plan we discussed and let me know if I can assist you in the future.   These are the goals we discussed: Goals    . Exercise 3x per week (30 min per time)     Low impact exercise 3-4 times per week. Ideal for you aquatic exercises.  Continue a low fat and low salt diet.       This is a list of the screening recommended for you and due dates:  Health Maintenance  Topic Date Due  . Tetanus Vaccine  Never done  . Mammogram  Never done  . DEXA scan (bone density measurement)  Never done  .  Hepatitis C: One time screening is recommended by Center for Disease Control  (CDC) for  adults born from [birth year] through 1963.   03/16/2020*  . Flu Shot  10/24/2019  . Colon Cancer Screening  09/24/2024  . COVID-19 Vaccine  Completed  . Pneumonia vaccines  Discontinued  *Topic was postponed. The date shown is not the original due date.   Mammogram will be arranged. Bone density next year. Tdap and shingrix vaccine at your pharmacy.  A few tips:  -As we age balance is not as good as it was, so there is a higher risks for falls. Please remove small rugs and furniture that is "in your way" and could increase the risk of falls. Stretching exercises may help with fall prevention: Yoga and Tai Chi are some examples. Low impact exercise is better, so you are not very achy the next day.  -Sun screen and avoidance of direct sun light recommended. Caution with dehydration, if working outdoors be sure to drink enough fluids.  - Some medications are not safe as we age, increases the risk of side effects and can potentially interact with other medication you are also taken;  including some of over the counter medications. Be sure to let me know when you start a new medication even if it is a dietary/vitamin supplement.   -Healthy diet low in  red meet/animal fat and sugar + regular physical activity is recommended.

## 2019-10-02 ENCOUNTER — Encounter: Payer: Self-pay | Admitting: Family Medicine

## 2019-10-04 ENCOUNTER — Emergency Department (HOSPITAL_COMMUNITY)
Admission: EM | Admit: 2019-10-04 | Discharge: 2019-10-04 | Disposition: A | Discharge status: Home / Self Care | Payer: Medicare Other | Source: Home / Self Care | Attending: Emergency Medicine | Admitting: Emergency Medicine

## 2019-10-04 ENCOUNTER — Other Ambulatory Visit: Payer: Self-pay

## 2019-10-04 ENCOUNTER — Emergency Department (HOSPITAL_COMMUNITY): Payer: Medicare Other

## 2019-10-04 ENCOUNTER — Encounter (HOSPITAL_COMMUNITY): Payer: Self-pay | Admitting: Emergency Medicine

## 2019-10-04 ENCOUNTER — Encounter: Payer: Self-pay | Admitting: Family Medicine

## 2019-10-04 DIAGNOSIS — Z79899 Other long term (current) drug therapy: Secondary | ICD-10-CM | POA: Insufficient documentation

## 2019-10-04 DIAGNOSIS — Z87891 Personal history of nicotine dependence: Secondary | ICD-10-CM | POA: Insufficient documentation

## 2019-10-04 DIAGNOSIS — Z950 Presence of cardiac pacemaker: Secondary | ICD-10-CM

## 2019-10-04 DIAGNOSIS — Z951 Presence of aortocoronary bypass graft: Secondary | ICD-10-CM | POA: Insufficient documentation

## 2019-10-04 DIAGNOSIS — R200 Anesthesia of skin: Secondary | ICD-10-CM | POA: Diagnosis not present

## 2019-10-04 DIAGNOSIS — R42 Dizziness and giddiness: Secondary | ICD-10-CM

## 2019-10-04 DIAGNOSIS — I639 Cerebral infarction, unspecified: Secondary | ICD-10-CM | POA: Diagnosis not present

## 2019-10-04 DIAGNOSIS — I10 Essential (primary) hypertension: Secondary | ICD-10-CM | POA: Insufficient documentation

## 2019-10-04 DIAGNOSIS — I2510 Atherosclerotic heart disease of native coronary artery without angina pectoris: Secondary | ICD-10-CM | POA: Insufficient documentation

## 2019-10-04 DIAGNOSIS — R531 Weakness: Secondary | ICD-10-CM | POA: Insufficient documentation

## 2019-10-04 DIAGNOSIS — H538 Other visual disturbances: Secondary | ICD-10-CM | POA: Insufficient documentation

## 2019-10-04 LAB — COMPREHENSIVE METABOLIC PANEL
ALT: 28 U/L (ref 0–44)
AST: 27 U/L (ref 15–41)
Albumin: 3.7 g/dL (ref 3.5–5.0)
Alkaline Phosphatase: 80 U/L (ref 38–126)
Anion gap: 8 (ref 5–15)
BUN: 8 mg/dL (ref 8–23)
CO2: 24 mmol/L (ref 22–32)
Calcium: 9 mg/dL (ref 8.9–10.3)
Chloride: 107 mmol/L (ref 98–111)
Creatinine, Ser: 0.82 mg/dL (ref 0.44–1.00)
GFR calc Af Amer: >60 mL/min (ref >60)
GFR calc non Af Amer: >60 mL/min (ref >60)
Glucose, Bld: 142 mg/dL — ABNORMAL HIGH (ref 70–99)
Potassium: 4 mmol/L (ref 3.5–5.1)
Sodium: 139 mmol/L (ref 135–145)
Total Bilirubin: 0.9 mg/dL (ref 0.3–1.2)
Total Protein: 7 g/dL (ref 6.5–8.1)

## 2019-10-04 LAB — CBC
HCT: 41.3 % (ref 36.0–46.0)
Hemoglobin: 13.3 g/dL (ref 12.0–15.0)
MCH: 28.5 pg (ref 26.0–34.0)
MCHC: 32.2 g/dL (ref 30.0–36.0)
MCV: 88.6 fL (ref 80.0–100.0)
Platelets: 185 10*3/uL (ref 150–400)
RBC: 4.66 MIL/uL (ref 3.87–5.11)
RDW: 13.2 % (ref 11.5–15.5)
WBC: 6.1 10*3/uL (ref 4.0–10.5)
nRBC: 0 % (ref 0.0–0.2)

## 2019-10-04 LAB — I-STAT BETA HCG BLOOD, ED (MC, WL, AP ONLY): I-stat hCG, quantitative: 5.3 m[IU]/mL — ABNORMAL HIGH (ref <5)

## 2019-10-04 LAB — ETHANOL: Alcohol, Ethyl (B): <10 mg/dL (ref <10)

## 2019-10-04 LAB — PROTIME-INR
INR: 1 (ref 0.8–1.2)
Prothrombin Time: 13.1 seconds (ref 11.4–15.2)

## 2019-10-04 LAB — DIFFERENTIAL
Abs Immature Granulocytes: 0.02 10*3/uL (ref 0.00–0.07)
Basophils Absolute: 0.1 10*3/uL (ref 0.0–0.1)
Basophils Relative: 1 %
Eosinophils Absolute: 0.3 10*3/uL (ref 0.0–0.5)
Eosinophils Relative: 4 %
Immature Granulocytes: 0 %
Lymphocytes Relative: 20 %
Lymphs Abs: 1.2 10*3/uL (ref 0.7–4.0)
Monocytes Absolute: 0.5 10*3/uL (ref 0.1–1.0)
Monocytes Relative: 7 %
Neutro Abs: 4.1 10*3/uL (ref 1.7–7.7)
Neutrophils Relative %: 68 %

## 2019-10-04 LAB — APTT: aPTT: 24 seconds (ref 24–36)

## 2019-10-04 IMAGING — MR MR HEAD W/O CM
9 of 10 series · 39 of 48 positions shown · non-contrast
Comparison: None.

CLINICAL DATA: Dizziness

EXAM:
MRI HEAD WITHOUT CONTRAST
TECHNIQUE: Multiplanar, multiecho pulse sequences of the brain and surrounding
structures were obtained without intravenous contrast.

[Series 3: DWI · axial · 3.0mm · 1.09mm/px · z∈[-54,+99]mm · 11 of 104 slices shown (1 of 4)]
[im 1/104]
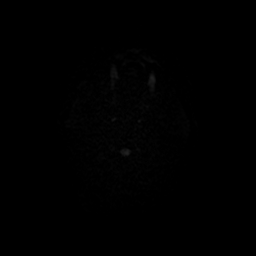
[im 11/104]
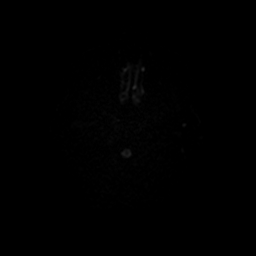
[im 21/104]
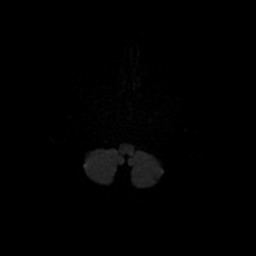
[im 31/104]
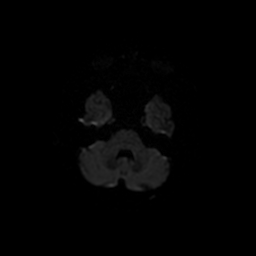
[im 42/104]
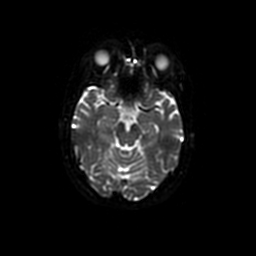
[im 52/104]
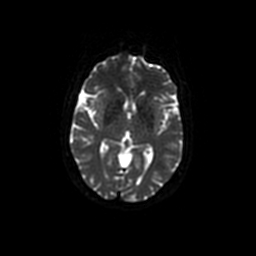
[im 62/104]
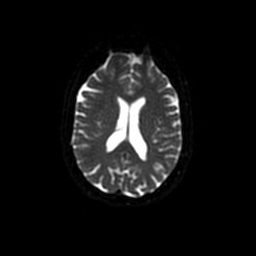
[im 73/104]
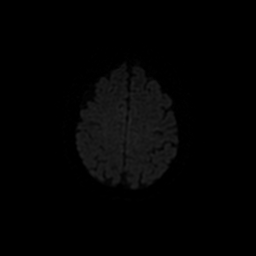
[im 83/104]
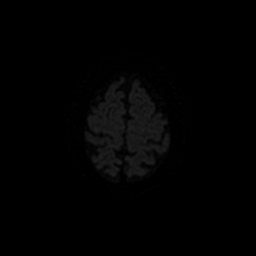
[im 93/104]
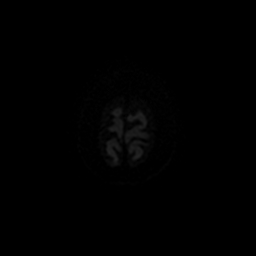
[im 104/104]
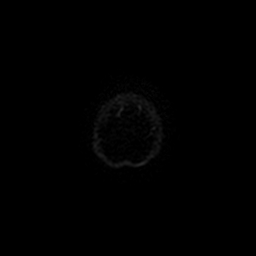

[Series 4: DWI · coronal · 5.0mm · 1.09mm/px · 8 of 72 slices shown (2 of 4)]
[im 1/72]
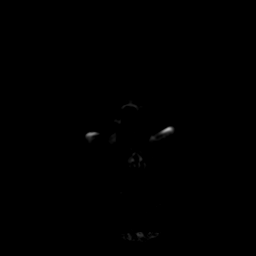
[im 11/72]
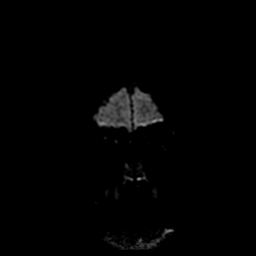
[im 21/72]
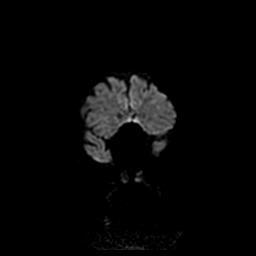
[im 31/72]
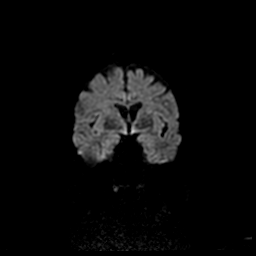
[im 41/72]
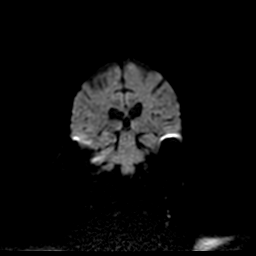
[im 51/72]
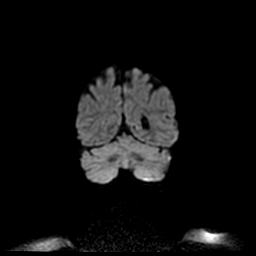
[im 61/72]
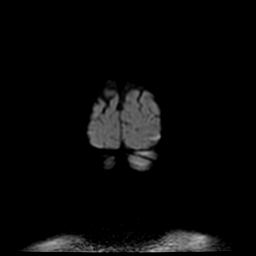
[im 72/72]
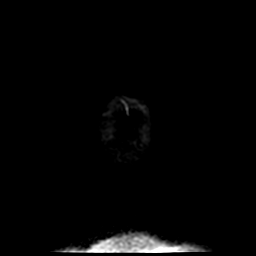

[Series 5: T1 · sagittal · 5.0mm · 0.47mm/px · 2 of 20 slices shown (1 of 2)]
[im 1/20]
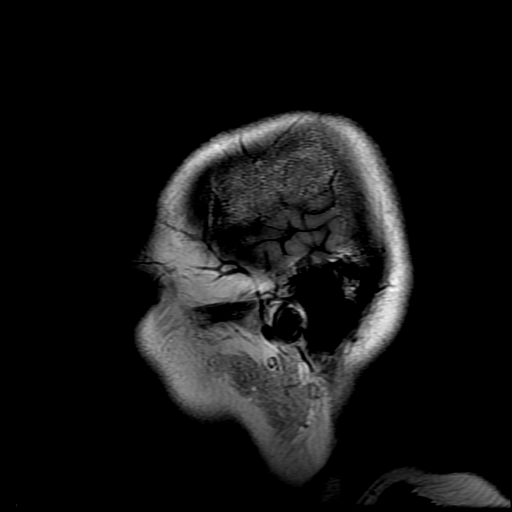
[im 20/20]
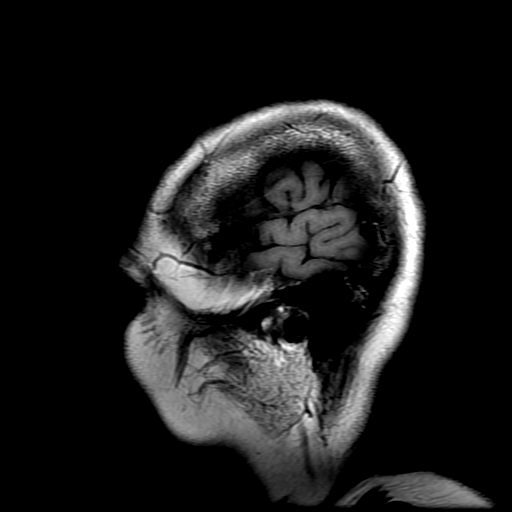

[Series 6: T2 · axial · 5.0mm · 0.43mm/px · z∈[-51,+93]mm · 2 of 25 slices shown (1 of 2)]
[im 1/25]
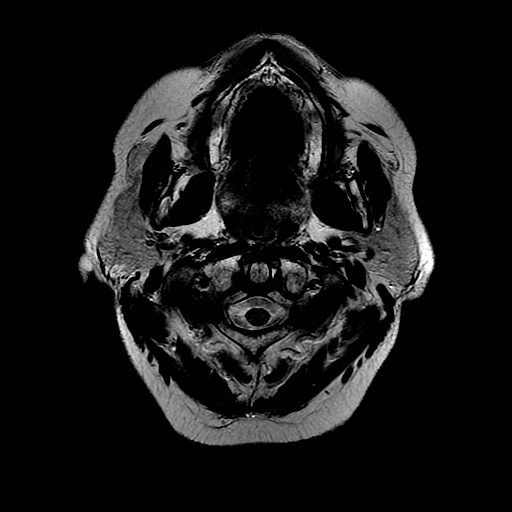
[im 25/25]
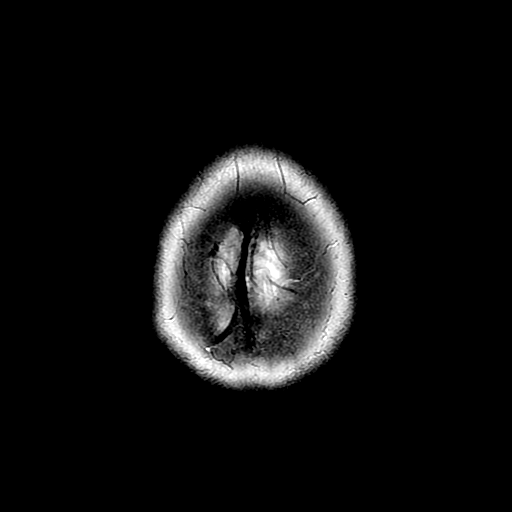

[Series 7: FLAIR · axial · 3.0mm · 0.43mm/px · z∈[-51,+93]mm · 2 of 25 slices shown]
[im 1/25]
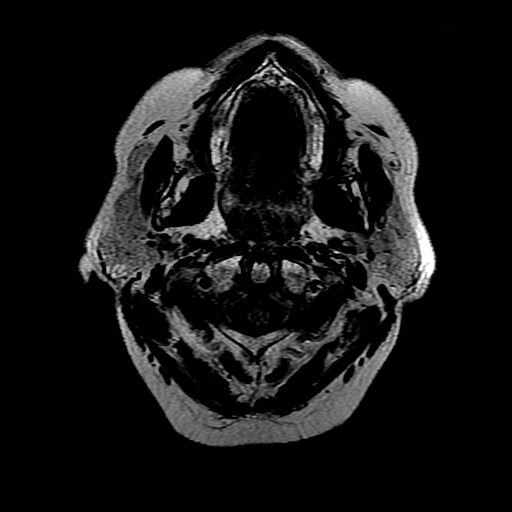
[im 25/25]
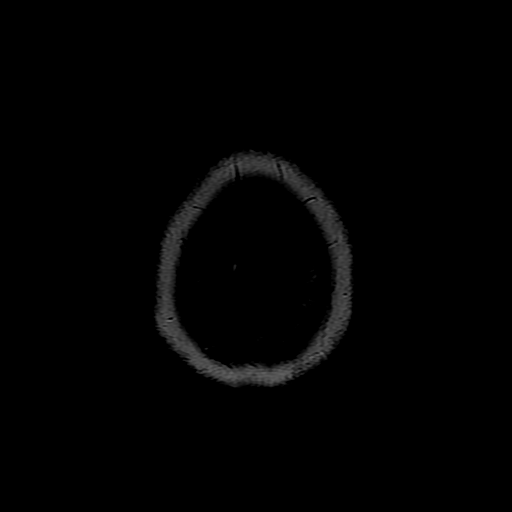

[Series 9: T1 · axial · 3.0mm · 0.43mm/px · z∈[-44,+6]mm · 3 of 100 slices shown (2 of 2)]
[im 1/100]
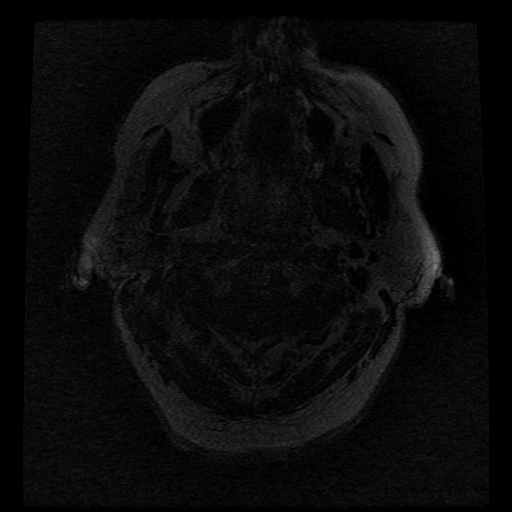
[im 12/100]
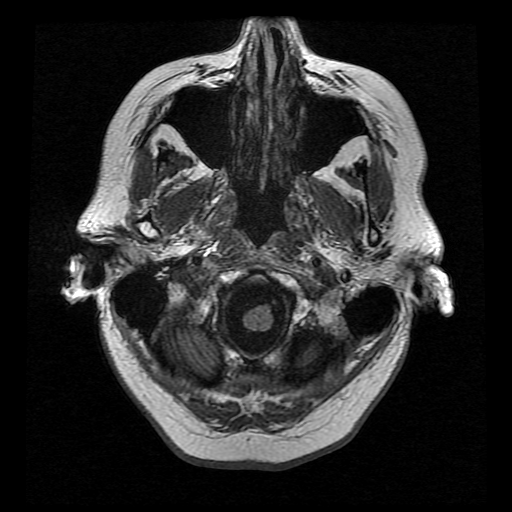
[im 34/100]
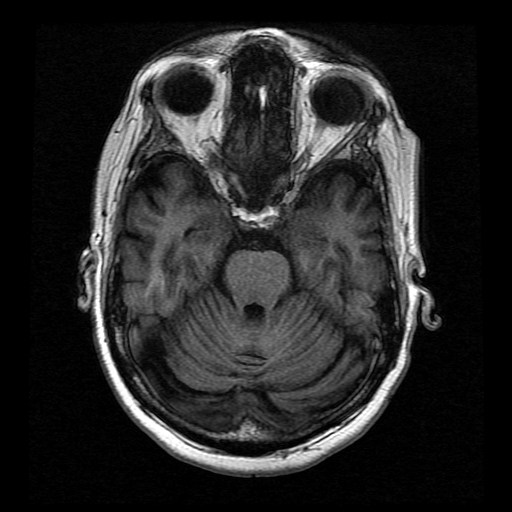

[Series 10: T2 · coronal · 5.0mm · 0.39mm/px · 2 of 25 slices shown (2 of 2)]
[im 1/25]
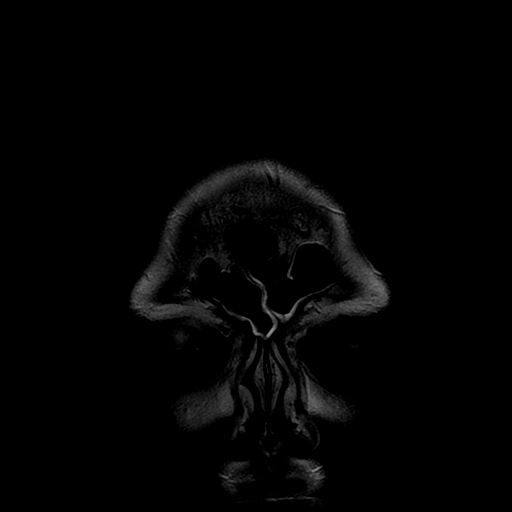
[im 25/25]
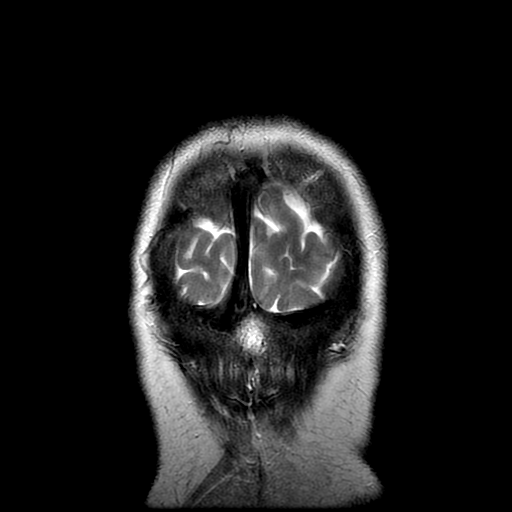

[Series 300: DWI · axial · 3.0mm · 1.09mm/px · z∈[-54,+99]mm · 5 of 52 slices shown (3 of 4)]
[im 1/52]
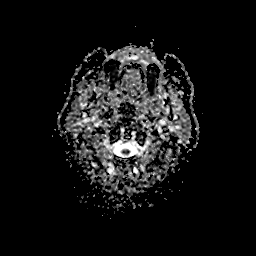
[im 13/52]
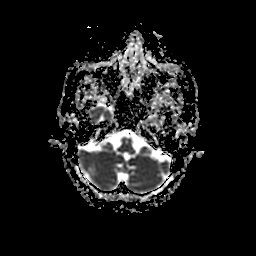
[im 26/52]
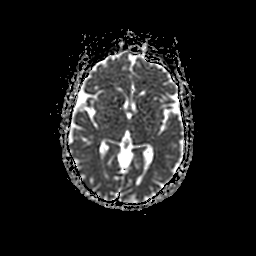
[im 39/52]
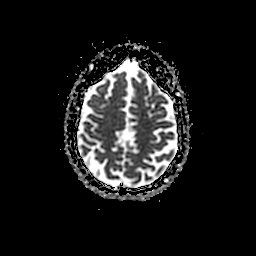
[im 52/52]
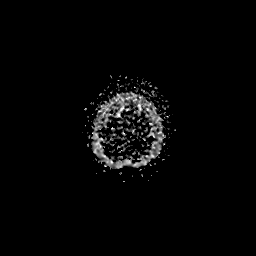

[Series 400: DWI · coronal · 5.0mm · 1.09mm/px · 4 of 36 slices shown (4 of 4)]
[im 1/36]
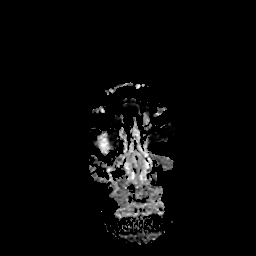
[im 12/36]
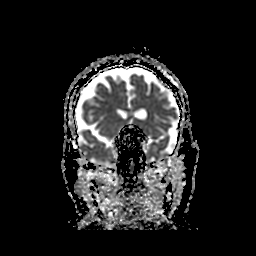
[im 24/36]
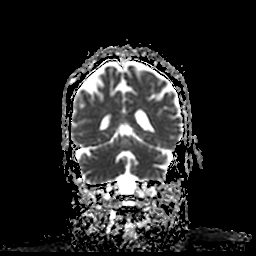
[im 36/36]
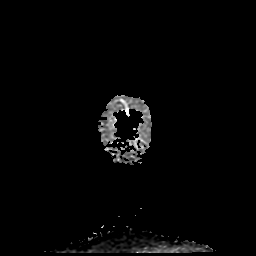

[39 of 48 positions shown; findings below may reference images not displayed]

FINDINGS: Brain: There is no acute infarction or intracranial hemorrhage.
There is no intracranial mass, mass effect, or edema. There is no
hydrocephalus or extra-axial fluid collection. Ventricles and sulci
are normal in size and configuration. Three small chronic left
cerebellar infarcts.

Vascular: Major vessel flow voids at the skull base are preserved.

Skull and upper cervical spine: Normal marrow signal is preserved.

Sinuses/Orbits: Mild mucosal thickening.  Orbits are unremarkable.

Other: Sella is unremarkable.  Mastoid air cells are clear.
IMPRESSION: No evidence of recent infarction, hemorrhage, or mass. Small chronic
left cerebellar infarcts.

## 2019-10-04 IMAGING — DX DG CHEST 1V PORT
1 series · 1 of 1 positions shown · non-contrast
Comparison: None.

CLINICAL DATA: Pacemaker

EXAM:
PORTABLE CHEST 1 VIEW

[chest]
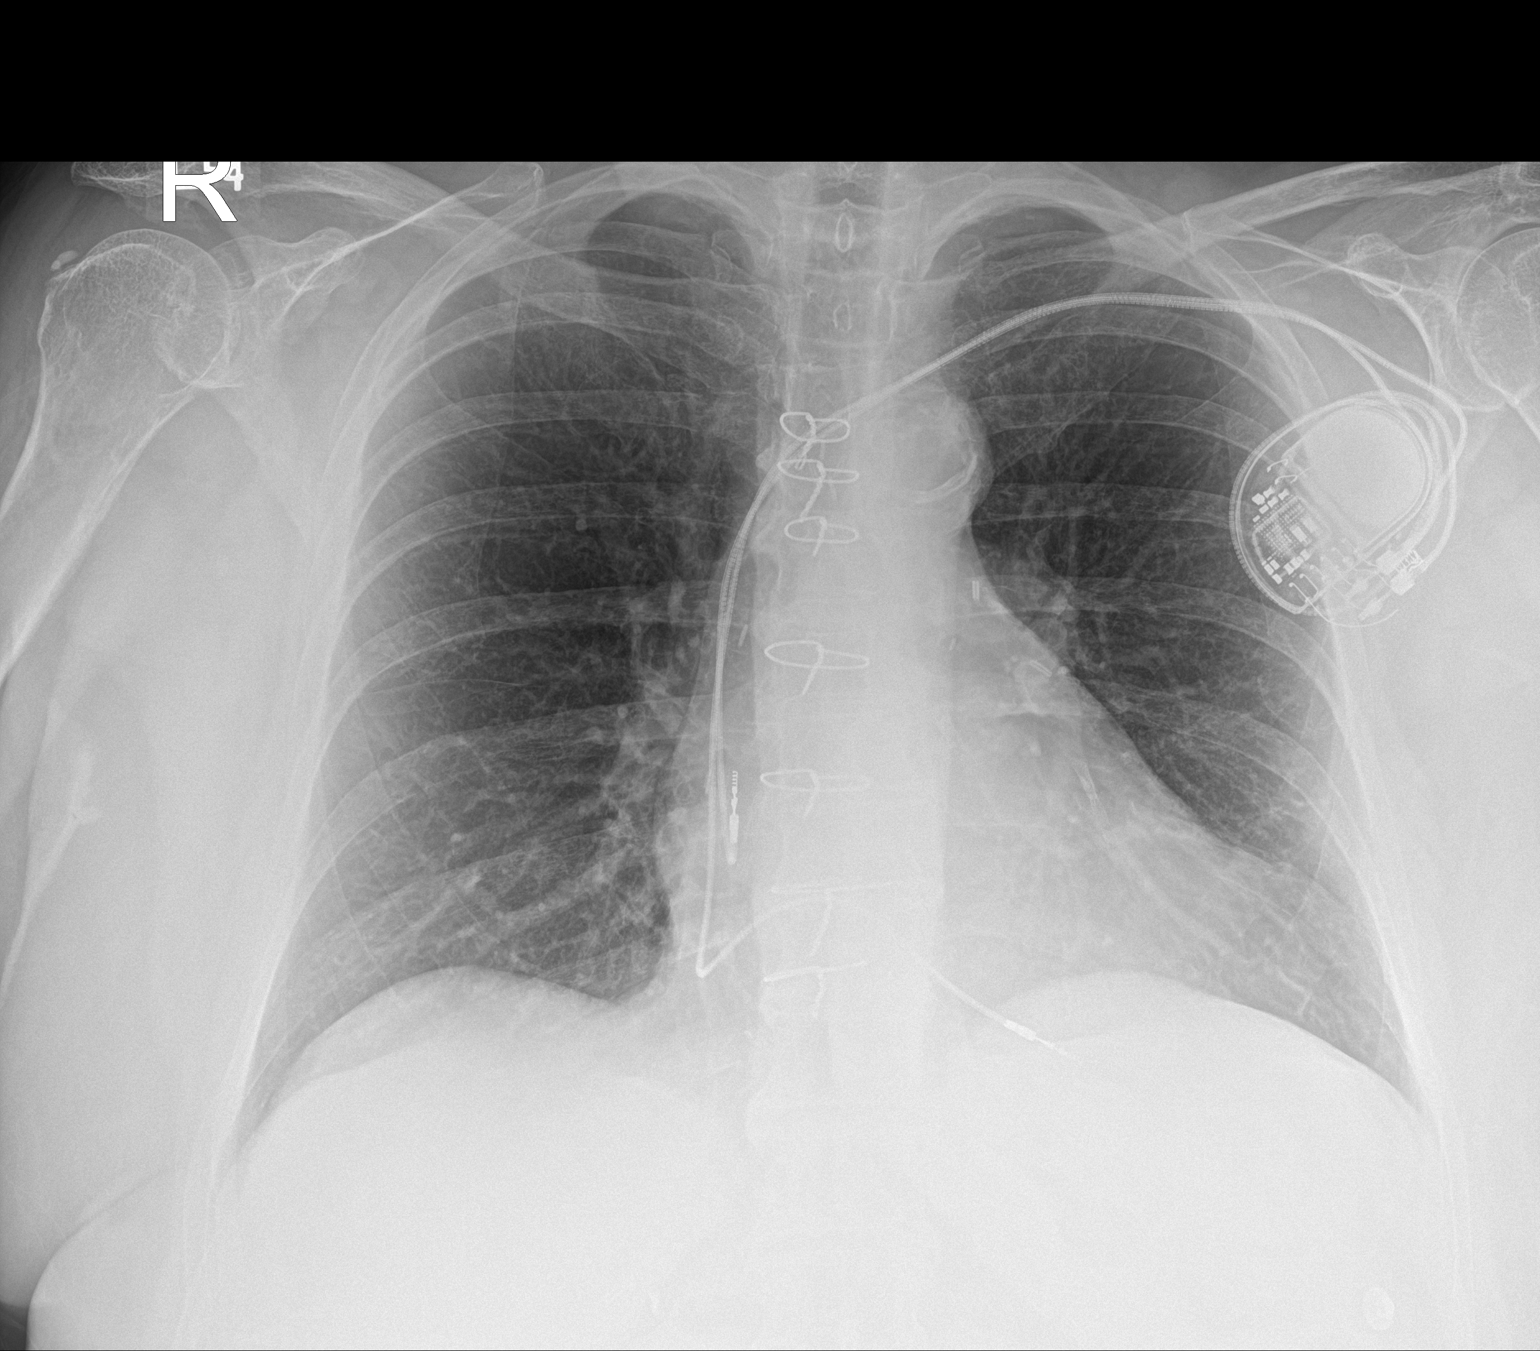

[1 of 1 positions shown; findings below may reference images not displayed]

FINDINGS: Left subclavian dual lead pacemaker in good position with leads in
the right atrium and right ventricle. Postop CABG.

Lungs are well aerated and clear. Negative for infiltrate effusion
or edema. Negative for heart failure. Atherosclerotic aortic arch.

Soft tissue calcification in the rotator cuff on the right
compatible with calcific tendinitis.
IMPRESSION: No active disease.

## 2019-10-04 MED ORDER — MECLIZINE HCL 12.5 MG PO TABS
12.5000 mg | ORAL_TABLET | Freq: Three times a day (TID) | ORAL | 0 refills | Status: DC | PRN
Start: 2019-10-04 — End: 2019-10-18

## 2019-10-04 MED ORDER — SERTRALINE HCL 50 MG PO TABS: 50.0000 mg | ORAL_TABLET | Freq: Every day | ORAL | 1 refills | Status: DC

## 2019-10-04 NOTE — ED Provider Notes (Signed)
[facility] EMERGENCY DEPARTMENT Provider Note   CSN: [id number] Arrival date & time: 10/04/19  0855     History Chief Complaint  Patient presents with  . Dizziness  . Weakness  . Blurred Vision    April Lowery is a 74 y.o. female with a past medical history of CAD, DVT/PE, hypertension, pacemaker in place, currently on Plavix, presenting to the ED with a chief complaint of dizziness.  States that she went to sleep last night around midnight and felt dizzy.  However when she woke up this morning her dizziness was worse.  She denies blurry vision but states that "I cannot get my eyes to focus."  She denies any loss of vision.  States that she is dizzy to the point where she is having trouble ambulating.  Also reports "weird feeling in my head, like it's gonna explode. But it doesn't hurt."  No history of similar symptoms in the past.  Reports nausea associated with her dizziness.  Denies any vomiting, injuries or falls, neck stiffness, chest pain, shortness of breath, fever.  She was told in the past that she had several "mini strokes but I never had any symptoms."  Reports compliance with her home medications.  No new changes to medications.  HPI     Past Medical History:  Diagnosis Date  . Acute ST segment elevation myocardial infarction ([place])   . Arthritis   . CAD (coronary artery disease) 02/15/2019  . Cancer of left kidney ([place])   . Complete atrioventricular block (HCC)   . DVT (deep venous thrombosis) ([place]) 02/15/2019  . GI bleeding   . 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 HLD (hyperlipidemia) 02/15/2019  . HTN (hypertension) 02/15/2019  . Ischemic cardiomyopathy   . Melena   . Pacemaker   . Pulmonary thromboembolism ([place]) 02/15/2019    Patient Active Problem List   Diagnosis Date Noted  . B12 deficiency 09/29/2019  . Morbid obesity ([place]) 09/29/2019  . H/O renal cell carcinoma 03/17/2019  . Anxiety disorder, unspecified 02/24/2019  .  Intermittent complete heart block ([place]) 02/16/2019  . Pacemaker 02/16/2019  . Mixed hyperlipidemia 02/15/2019  . CAD (coronary artery disease) 02/15/2019  . HTN (hypertension) 02/15/2019  . 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 Type 2 diabetes mellitus with other specified complication ([place]) 19/50/9326    Past Surgical History:  Procedure Laterality Date  . CHOLECYSTECTOMY    . CORONARY ARTERY BYPASS GRAFT     STENTS  . PACEMAKER INSERTION    . TONSILLECTOMY AND ADENOIDECTOMY  2017     OB History   No obstetric history on file.     Family History  Problem Relation Age of Onset  . Heart disease Mother   . Heart attack Mother   . Heart disease Father   . Heart attack Father   . Heart disease Brother   . Heart attack Brother   . Heart attack Maternal Grandmother   . Heart attack Maternal Grandfather   . Heart attack Paternal Grandmother   . Heart attack Paternal Grandfather     Social History   Tobacco Use  . Smoking status: Former Research scientist (life sciences)  . Smokeless tobacco: Never Used  Vaping Use  . Vaping Use: Never used  Substance Use Topics  . Alcohol use: Never  . Drug use: Never    Home Medications Prior to Admission medications   Medication Sig Start Date End Date Taking? Authorizing Provider  acetaminophen (TYLENOL) 650 MG CR tablet Take  650 mg by mouth every 8 (eight) hours as needed for pain.   Yes [provider]  cholecalciferol (VITAMIN D3) 25 MCG (1000 UT) tablet Take 1,000 Units by mouth daily.   Yes [provider]  clopidogrel (PLAVIX) 75 MG tablet Take 1 tablet (75 mg total) by mouth daily. 02/16/19  Yes Evans Lance, MD  Cyanocobalamin (B-12) 2500 MCG TABS Take 1 tablet by mouth daily.   Yes [provider]  ezetimibe (ZETIA) 10 MG tablet Take 1 tablet (10 mg total) by mouth daily. 02/16/19  Yes Evans Lance, MD  lisinopril (ZESTRIL) 5 MG tablet Take 5 mg by mouth daily.   Yes [provider]  metoprolol  succinate (TOPROL-XL) 50 MG 24 hr tablet Take 1 tablet (50 mg total) by mouth daily. Take with or immediately following a meal. 02/16/19  Yes Evans Lance, MD  omeprazole (PRILOSEC) 20 MG capsule Take 20 mg by mouth as needed (acid reflux).    Yes [provider]  rosuvastatin (CRESTOR) 40 MG tablet Take 1 tablet (40 mg total) by mouth daily. Patient taking differently: Take 40 mg by mouth at bedtime.  02/16/19  Yes Evans Lance, MD  sertraline (ZOLOFT) 50 MG tablet TAKE 1 TABLET BY MOUTH EVERY DAY Patient taking differently: Take 50 mg by mouth daily.  09/10/19  Yes [place], Betty G, MD  meclizine (ANTIVERT) 12.5 MG tablet Take 1 tablet (12.5 mg total) by mouth 3 (three) times daily as needed for dizziness. 10/04/19   Shawniece Oyola, PA-C    Allergies    Penicillins  Review of Systems   Review of Systems  Constitutional: Negative for appetite change, chills and fever.  HENT: Negative for ear pain, rhinorrhea, sneezing and sore throat.   Eyes: Positive for visual disturbance. Negative for photophobia.  Respiratory: Negative for cough, chest tightness, shortness of breath and wheezing.   Cardiovascular: Negative for chest pain and palpitations.  Gastrointestinal: Positive for nausea. Negative for abdominal pain, blood in stool, constipation, diarrhea and vomiting.  Genitourinary: Negative for dysuria, hematuria and urgency.  Musculoskeletal: Negative for myalgias.  Skin: Negative for rash.  Neurological: Positive for dizziness. Negative for weakness and light-headedness.    Physical Exam Updated Vital Signs BP (!) 125/57   Pulse 60   Temp 98.3 F (36.8 C) (Oral)   Resp 12   SpO2 94%   Physical Exam Vitals and nursing note reviewed.  Constitutional:      General: She is not in acute distress.    Appearance: She is well-developed.  HENT:     Head: Normocephalic and atraumatic.     Nose: Nose normal.  Eyes:     General: No scleral icterus.       Right eye: No  discharge.        Left eye: No discharge.     Conjunctiva/sclera: Conjunctivae normal.     Pupils: Pupils are equal, round, and reactive to light.  Cardiovascular:     Rate and Rhythm: Normal rate and regular rhythm.     Heart sounds: Normal heart sounds. No murmur heard.  No friction rub. No gallop.   Pulmonary:     Effort: Pulmonary effort is normal. No respiratory distress.     Breath sounds: Normal breath sounds.  Abdominal:     General: Bowel sounds are normal. There is no distension.     Palpations: Abdomen is soft.     Tenderness: There is no abdominal tenderness. There is no guarding.  Musculoskeletal:        General: Normal range of motion.     Cervical back: Normal range of motion and neck supple.  Skin:    General: Skin is warm and dry.     Findings: No rash.  Neurological:     General: No focal deficit present.     Mental Status: She is alert and oriented to person, place, and time.     Cranial Nerves: No cranial nerve deficit.     Sensory: No sensory deficit.     Motor: No weakness or abnormal muscle tone.     Coordination: Coordination normal.     Comments: Pupils reactive. No facial asymmetry noted. Cranial nerves appear grossly intact. Sensation intact to light touch on face, BUE and BLE. Strength 5/5 in BUE and BLE. Normal finger to nose coordination bilaterally (although appears to struggle with L hand).  Normal heel-to-shin bilaterally.     ED Results / Procedures / Treatments   Labs (all labs ordered are listed, but only abnormal results are displayed) Labs Reviewed  COMPREHENSIVE METABOLIC PANEL - Abnormal; Notable for the following components:      Result Value   Glucose, Bld 142 (*)    All other components within normal limits  I-STAT BETA HCG BLOOD, ED ([facility], [facility], [facility] ONLY) - Abnormal; Notable for the following components:   I-stat hCG, quantitative 5.3 (*)    All other components within normal limits  ETHANOL  PROTIME-INR  APTT  CBC  DIFFERENTIAL    RAPID URINE DRUG SCREEN, HOSP PERFORMED  URINALYSIS, ROUTINE W REFLEX MICROSCOPIC  I-STAT CHEM 8, ED    EKG EKG Interpretation  Date/Time:  Monday October 04 2019 08:56:41 EDT Ventricular Rate:  62 PR Interval:    QRS Duration: 129 QT Interval:  416 QTC Calculation: 423 R Axis:   49 Text Interpretation: Sinus rhythm Borderline prolonged PR interval Nonspecific intraventricular conduction delay Inferior infarct, old No prior ECG for comparison. No STEMI Confirmed by Antony Blackbird [phone]) on 10/04/2019 9:11:35 AM Also confirmed by Antony Blackbird [phone]), editor Hattie Perch ([id number])  on 10/04/2019 1:15:02 PM   Radiology MR BRAIN WO CONTRAST  Result Date: 10/04/2019 CLINICAL DATA:  Dizziness EXAM: MRI HEAD WITHOUT CONTRAST TECHNIQUE: Multiplanar, multiecho pulse sequences of the brain and surrounding structures were obtained without intravenous contrast. COMPARISON:  None. FINDINGS: Brain: There is no acute infarction or intracranial hemorrhage. There is no intracranial mass, mass effect, or edema. There is no hydrocephalus or extra-axial fluid collection. Ventricles and sulci are normal in size and configuration. Three small chronic left cerebellar infarcts. Vascular: Major vessel flow voids at the skull base are preserved. Skull and upper cervical spine: Normal marrow signal is preserved. Sinuses/Orbits: Mild mucosal thickening.  Orbits are unremarkable. Other: Sella is unremarkable.  Mastoid air cells are clear. IMPRESSION: No evidence of recent infarction, hemorrhage, or mass. Small chronic left cerebellar infarcts. Electronically Signed   By: Macy Mis M.D.   On: 10/04/2019 14:47   DG CHEST PORT 1 VIEW  Result Date: 10/04/2019 CLINICAL DATA:  Pacemaker EXAM: PORTABLE CHEST 1 VIEW COMPARISON:  None. FINDINGS: Left subclavian dual lead pacemaker in good position with leads in the right atrium and right ventricle. Postop CABG. Lungs are well aerated and clear. Negative for infiltrate  effusion or edema. Negative for heart failure. Atherosclerotic aortic arch. Soft tissue calcification in the rotator cuff on the right compatible with calcific tendinitis. IMPRESSION: No active disease. Electronically Signed   By: Franchot Gallo  M.D.   On: 10/04/2019 12:46    Procedures Procedures (including critical care time)  Medications Ordered in ED Medications - No data to display  ED Course  I have reviewed the triage vital signs and the nursing notes.  Pertinent labs & imaging results that were available during my care of the patient were reviewed by me and considered in my medical decision making (see chart for details).    MDM Rules/Calculators/A&P                          74 year old female with a past medical history of CAD, DVT/PE, hypertension, pacemaker in place currently on Plavix presenting to the ED with a chief complaint of dizziness.  Had dizziness when she went to bed last night around midnight but symptoms worsened when she woke up this morning.  She now reports associated difficulty getting her eyes to focus but denies any loss of vision.  Reports nausea as well.  Denies headache but reports a "weird feeling in my head."  Reports history of what appears to be TIAs in the past.  Reports compliance with her home medications.  Denies any injuries or falls.  On exam patient without any weakness or objective signs of numbness present.  No facial asymmetry noted.  Normal sensation of face, upper and lower extremities bilaterally.  Good distal pulses bilaterally.  Normal coordination.  EKG here shows sinus rhythm, no evidence of A. fib.  Chest x-ray is unremarkable.  Lab work including CBC, [place], [place] labs unremarkable.  MRI of the brain shows no evidence of acute infarct, hemorrhage or mass.  Patient remains in no acute distress at this time.  Question whether the symptoms could be caused by vertigo.  I will have her take meclizine as needed.  Informed that her workup has been  reassuring without evidence of acute life-threatening neurological or other cause.  Patient is comfortable with discharge home. Patient discussed with and seen by the attending, Dr. Sherry Ruffing.   Patient is hemodynamically stable, in NAD. Evaluation does not show pathology that would require ongoing emergent intervention or inpatient treatment. I explained the diagnosis to the patient. Pain has been managed and has no complaints prior to discharge. Patient is comfortable with above plan and is stable for discharge at this time. All questions were answered prior to disposition. Strict return precautions for returning to the ED were discussed. Encouraged follow up with PCP.   An After Visit Summary was printed and given to the patient.   Portions of this note were generated with Lobbyist. Dictation errors may occur despite best attempts at proofreading.   Final Clinical Impression(s) / ED Diagnoses Final diagnoses:  Dizziness  Vertigo    Rx / DC Orders ED Discharge Orders         Ordered    meclizine (ANTIVERT) 12.5 MG tablet  3 times daily PRN     Discontinue  Reprint     10/04/19 [place], Maurita Havener, PA-C 10/04/19 1504    Tegeler, Gwenyth Allegra, MD 10/05/19 [phone]

## 2019-10-04 NOTE — ED Notes (Signed)
Patient verbalizes understanding of discharge instructions. Opportunity for questioning and answers were provided. Armband removed by staff, pt discharged from ED.  

## 2019-10-04 NOTE — ED Notes (Signed)
PT in MRI.

## 2019-10-04 NOTE — ED Notes (Signed)
Patient is still in MRI 

## 2019-10-04 NOTE — ED Triage Notes (Signed)
Pt in via [facility] w/dizziness and blurred vision since waking this am. States she went to bed normal at midnight. Hx of triple bypass, stents, and pacemaker. Denies any cp or sob. BP did go from 170/80 to 140/70 sitting to standing. CBG 120, has not eaten or taken BP meds this am

## 2019-10-04 NOTE — ED Notes (Signed)
Got patient undress on the monitor did ekg shown to Dr Gustavus Messing patient is resting with call bell in reach

## 2019-10-04 NOTE — Progress Notes (Signed)
Changed device settings for MRI to  DOO at 70bpm  Will program device back to pre-MRI settings after completion of exam per order.

## 2019-10-04 NOTE — Discharge Instructions (Addendum)
Continue taking your home medications as previously prescribed. You can take the meclizine as needed to help with your symptoms. Follow up with your primary care provider. Return to the ED if you start to experience worsening dizziness, numbness, chest pain, shortness of breath, injuries or falls, leg swelling.

## 2019-10-05 ENCOUNTER — Encounter: Payer: Self-pay | Admitting: Family Medicine

## 2019-10-06 ENCOUNTER — Ambulatory Visit (INDEPENDENT_AMBULATORY_CARE_PROVIDER_SITE_OTHER): Payer: Medicare Other

## 2019-10-06 ENCOUNTER — Inpatient Hospital Stay (HOSPITAL_COMMUNITY)
Admission: EM | Admit: 2019-10-06 | Discharge: 2019-10-08 | DRG: 065 | Disposition: A | Discharge status: Home / Self Care | Payer: Medicare Other | Attending: Internal Medicine | Admitting: Internal Medicine

## 2019-10-06 ENCOUNTER — Emergency Department (HOSPITAL_COMMUNITY): Payer: Medicare Other

## 2019-10-06 DIAGNOSIS — R29703 NIHSS score 3: Secondary | ICD-10-CM | POA: Diagnosis present

## 2019-10-06 DIAGNOSIS — Z85528 Personal history of other malignant neoplasm of kidney: Secondary | ICD-10-CM

## 2019-10-06 DIAGNOSIS — Z880 Allergy status to penicillin: Secondary | ICD-10-CM

## 2019-10-06 DIAGNOSIS — I255 Ischemic cardiomyopathy: Secondary | ICD-10-CM | POA: Diagnosis present

## 2019-10-06 DIAGNOSIS — Z0000 Encounter for general adult medical examination without abnormal findings: Secondary | ICD-10-CM

## 2019-10-06 DIAGNOSIS — E119 Type 2 diabetes mellitus without complications: Secondary | ICD-10-CM | POA: Diagnosis present

## 2019-10-06 DIAGNOSIS — I252 Old myocardial infarction: Secondary | ICD-10-CM

## 2019-10-06 DIAGNOSIS — Z8249 Family history of ischemic heart disease and other diseases of the circulatory system: Secondary | ICD-10-CM

## 2019-10-06 DIAGNOSIS — R270 Ataxia, unspecified: Secondary | ICD-10-CM | POA: Diagnosis present

## 2019-10-06 DIAGNOSIS — I6381 Other cerebral infarction due to occlusion or stenosis of small artery: Secondary | ICD-10-CM

## 2019-10-06 DIAGNOSIS — Z86711 Personal history of pulmonary embolism: Secondary | ICD-10-CM

## 2019-10-06 DIAGNOSIS — Z20822 Contact with and (suspected) exposure to covid-19: Secondary | ICD-10-CM | POA: Diagnosis present

## 2019-10-06 DIAGNOSIS — I10 Essential (primary) hypertension: Secondary | ICD-10-CM | POA: Diagnosis present

## 2019-10-06 DIAGNOSIS — Z951 Presence of aortocoronary bypass graft: Secondary | ICD-10-CM

## 2019-10-06 DIAGNOSIS — Z955 Presence of coronary angioplasty implant and graft: Secondary | ICD-10-CM

## 2019-10-06 DIAGNOSIS — Z79899 Other long term (current) drug therapy: Secondary | ICD-10-CM

## 2019-10-06 DIAGNOSIS — E782 Mixed hyperlipidemia: Secondary | ICD-10-CM | POA: Diagnosis present

## 2019-10-06 DIAGNOSIS — Z86718 Personal history of other venous thrombosis and embolism: Secondary | ICD-10-CM

## 2019-10-06 DIAGNOSIS — M1990 Unspecified osteoarthritis, unspecified site: Secondary | ICD-10-CM | POA: Diagnosis present

## 2019-10-06 DIAGNOSIS — E785 Hyperlipidemia, unspecified: Secondary | ICD-10-CM | POA: Diagnosis present

## 2019-10-06 DIAGNOSIS — I639 Cerebral infarction, unspecified: Secondary | ICD-10-CM | POA: Diagnosis present

## 2019-10-06 DIAGNOSIS — I2510 Atherosclerotic heart disease of native coronary artery without angina pectoris: Secondary | ICD-10-CM | POA: Diagnosis present

## 2019-10-06 DIAGNOSIS — Z7902 Long term (current) use of antithrombotics/antiplatelets: Secondary | ICD-10-CM

## 2019-10-06 DIAGNOSIS — I442 Atrioventricular block, complete: Secondary | ICD-10-CM | POA: Diagnosis present

## 2019-10-06 DIAGNOSIS — Z8673 Personal history of transient ischemic attack (TIA), and cerebral infarction without residual deficits: Secondary | ICD-10-CM

## 2019-10-06 DIAGNOSIS — R200 Anesthesia of skin: Secondary | ICD-10-CM

## 2019-10-06 DIAGNOSIS — Z950 Presence of cardiac pacemaker: Secondary | ICD-10-CM

## 2019-10-06 DIAGNOSIS — Z87891 Personal history of nicotine dependence: Secondary | ICD-10-CM

## 2019-10-06 LAB — PROTIME-INR
INR: 1 (ref 0.8–1.2)
Prothrombin Time: 13 seconds (ref 11.4–15.2)

## 2019-10-06 LAB — COMPREHENSIVE METABOLIC PANEL
ALT: 26 U/L (ref 0–44)
AST: 26 U/L (ref 15–41)
Albumin: 4 g/dL (ref 3.5–5.0)
Alkaline Phosphatase: 88 U/L (ref 38–126)
Anion gap: 11 (ref 5–15)
BUN: 11 mg/dL (ref 8–23)
CO2: 23 mmol/L (ref 22–32)
Calcium: 9.2 mg/dL (ref 8.9–10.3)
Chloride: 104 mmol/L (ref 98–111)
Creatinine, Ser: 0.74 mg/dL (ref 0.44–1.00)
GFR calc Af Amer: >60 mL/min (ref >60)
GFR calc non Af Amer: >60 mL/min (ref >60)
Glucose, Bld: 132 mg/dL — ABNORMAL HIGH (ref 70–99)
Potassium: 3.9 mmol/L (ref 3.5–5.1)
Sodium: 138 mmol/L (ref 135–145)
Total Bilirubin: 0.4 mg/dL (ref 0.3–1.2)
Total Protein: 7.3 g/dL (ref 6.5–8.1)

## 2019-10-06 LAB — CBC
HCT: 42.4 % (ref 36.0–46.0)
Hemoglobin: 13.5 g/dL (ref 12.0–15.0)
MCH: 28.5 pg (ref 26.0–34.0)
MCHC: 31.8 g/dL (ref 30.0–36.0)
MCV: 89.6 fL (ref 80.0–100.0)
Platelets: 219 10*3/uL (ref 150–400)
RBC: 4.73 MIL/uL (ref 3.87–5.11)
RDW: 12.9 % (ref 11.5–15.5)
WBC: 7.4 10*3/uL (ref 4.0–10.5)
nRBC: 0 % (ref 0.0–0.2)

## 2019-10-06 LAB — RAPID URINE DRUG SCREEN, HOSP PERFORMED
Amphetamines: NOT DETECTED
Barbiturates: NOT DETECTED
Benzodiazepines: NOT DETECTED
Cocaine: NOT DETECTED
Opiates: NOT DETECTED
Tetrahydrocannabinol: NOT DETECTED

## 2019-10-06 LAB — DIFFERENTIAL
Abs Immature Granulocytes: 0.04 10*3/uL (ref 0.00–0.07)
Basophils Absolute: 0.1 10*3/uL (ref 0.0–0.1)
Basophils Relative: 2 %
Eosinophils Absolute: 0.3 10*3/uL (ref 0.0–0.5)
Eosinophils Relative: 4 %
Immature Granulocytes: 1 %
Lymphocytes Relative: 28 %
Lymphs Abs: 2.1 10*3/uL (ref 0.7–4.0)
Monocytes Absolute: 0.6 10*3/uL (ref 0.1–1.0)
Monocytes Relative: 8 %
Neutro Abs: 4.2 10*3/uL (ref 1.7–7.7)
Neutrophils Relative %: 57 %

## 2019-10-06 LAB — URINALYSIS, ROUTINE W REFLEX MICROSCOPIC
Bacteria, UA: NONE SEEN
Bilirubin Urine: NEGATIVE
Glucose, UA: NEGATIVE mg/dL
Hgb urine dipstick: NEGATIVE
Ketones, ur: NEGATIVE mg/dL
Nitrite: NEGATIVE
Protein, ur: NEGATIVE mg/dL
Specific Gravity, Urine: 1.009 (ref 1.005–1.030)
pH: 5 (ref 5.0–8.0)

## 2019-10-06 LAB — APTT: aPTT: 32 seconds (ref 24–36)

## 2019-10-06 LAB — ETHANOL: Alcohol, Ethyl (B): <10 mg/dL (ref <10)

## 2019-10-06 IMAGING — CT CT HEAD W/O CM
4 series · 17 of 47 positions shown, 19 images · non-contrast
Comparison: Brain MRI dated [DATE].

CLINICAL DATA: 74-year-old female with focal neurologic deficit.

EXAM:
CT HEAD WITHOUT CONTRAST
TECHNIQUE: Contiguous axial images were obtained from the base of the skull
through the vertex without intravenous contrast.

[Series 3: head wo · axial · 0.43mm/px · z∈[+966,+1086]mm · 7 of 33 slices shown, 9 images]
[im 5/33  brain]
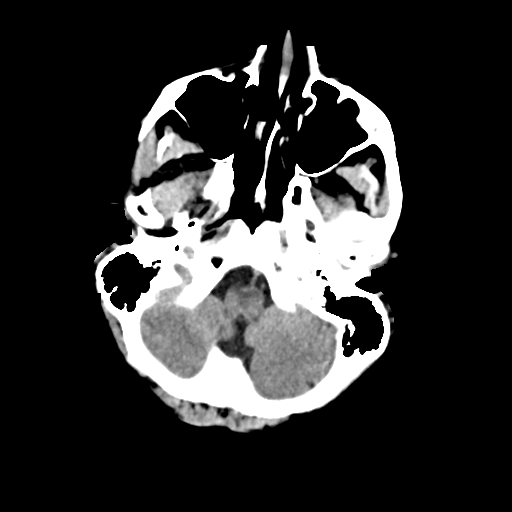
[im 5/33  bone]
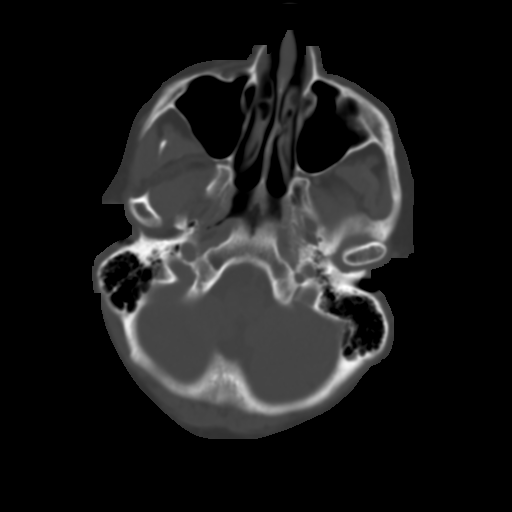
[im 9/33  brain]
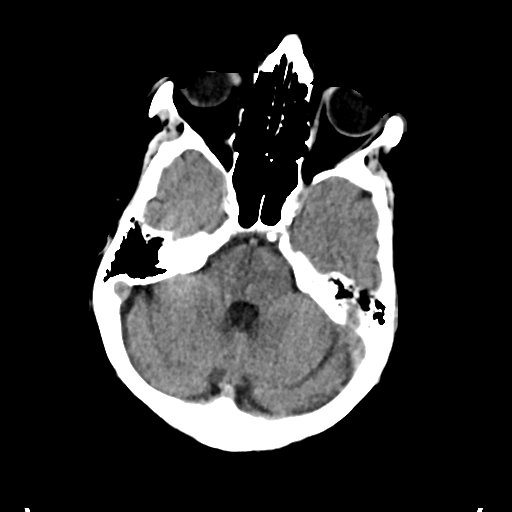
[im 13/33  brain]
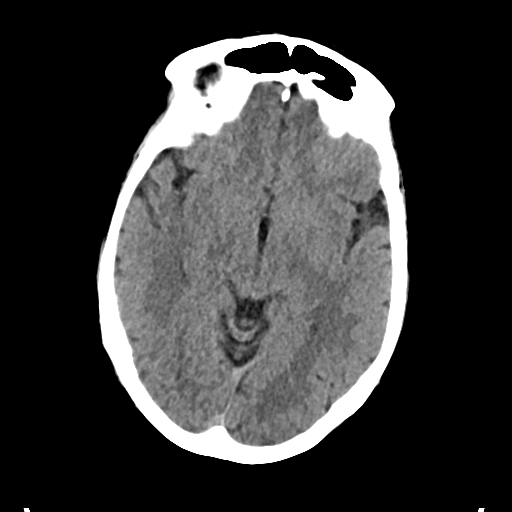
[im 17/33  brain]
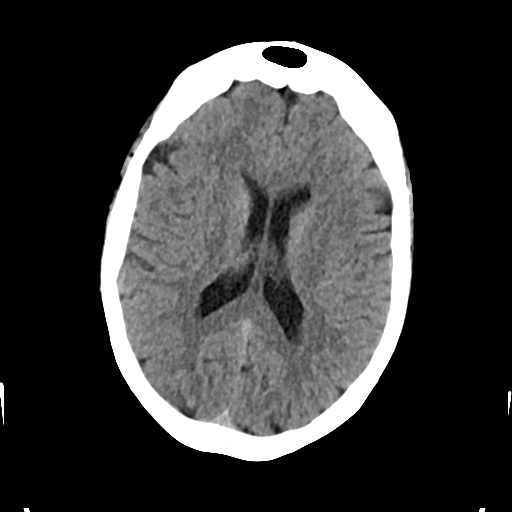
[im 21/33  brain]
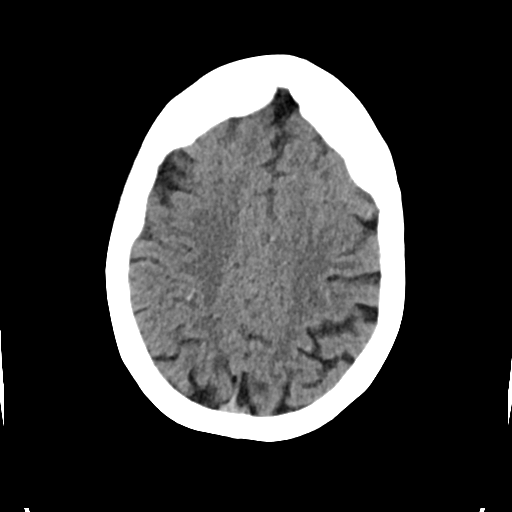
[im 21/33  bone]
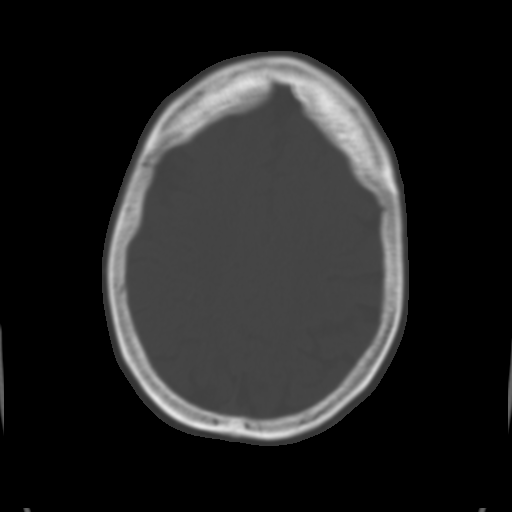
[im 25/33  brain]
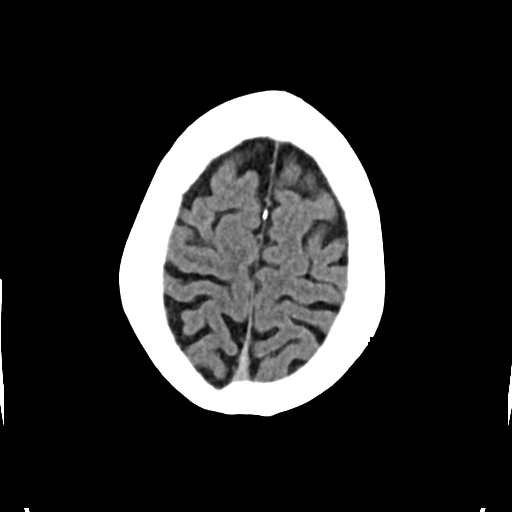
[im 29/33  brain]
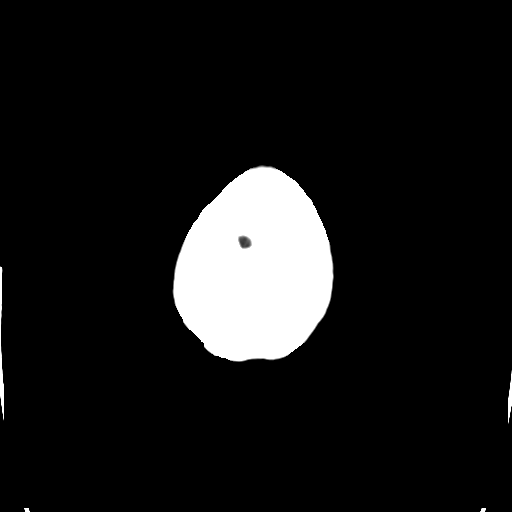

[Series 4: head bone · axial · 0.43mm/px · z∈[+962,+1018]mm · 4 of 82 slices shown]
[im 9/82  bone]
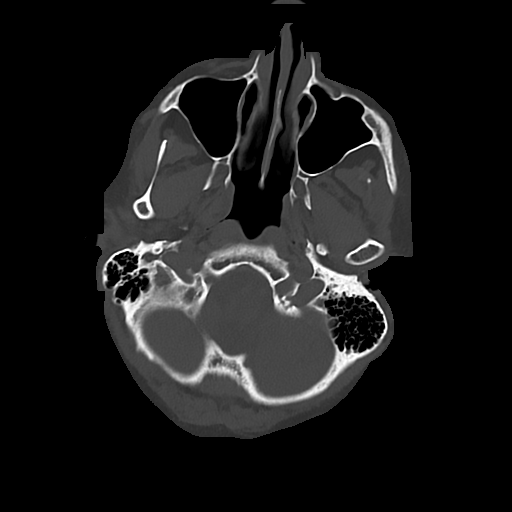
[im 17/82  bone]
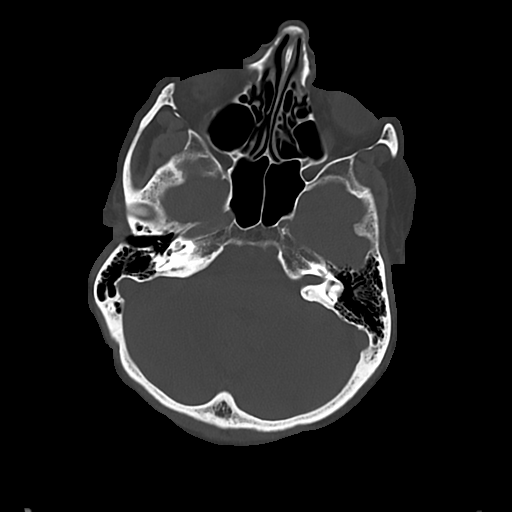
[im 25/82  bone]
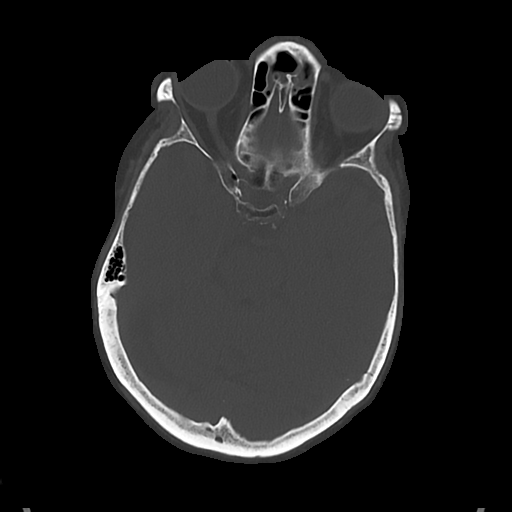
[im 37/82  bone]
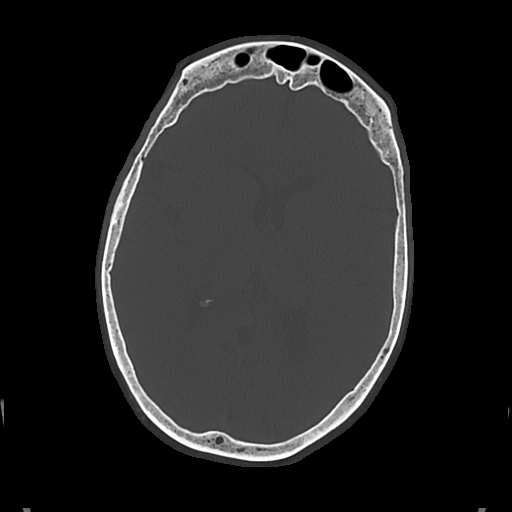

[Series 5: cor soft · coronal · 0.33mm/px · 3 of 69 slices shown]
[im 23/69  brain]
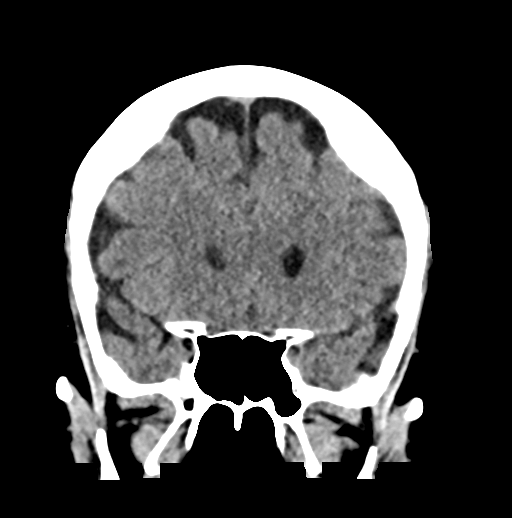
[im 31/69  brain]
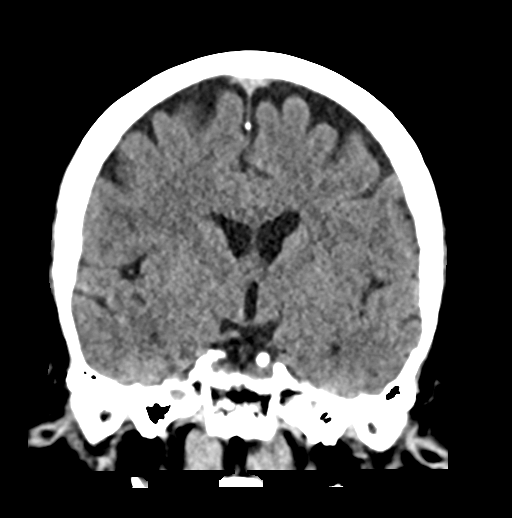
[im 38/69  brain]
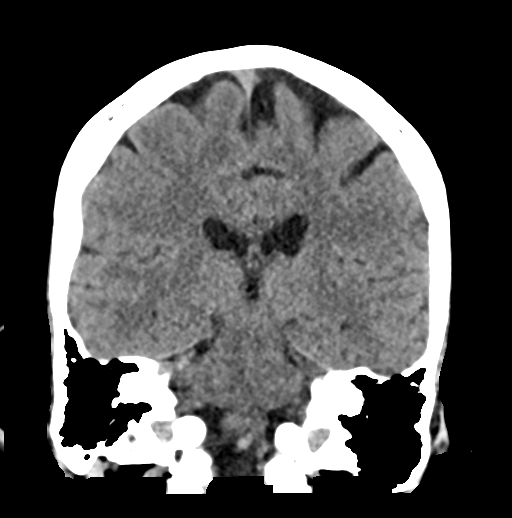

[Series 6: sag soft · sagittal · 0.33mm/px · 3 of 57 slices shown]
[im 19/57  brain]
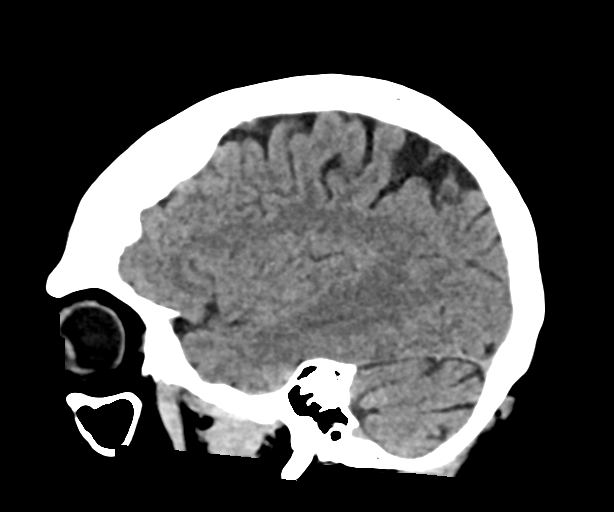
[im 29/57  brain]
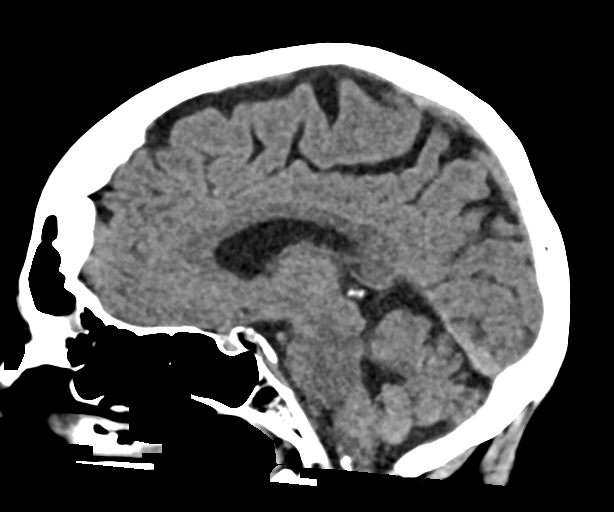
[im 38/57  brain]
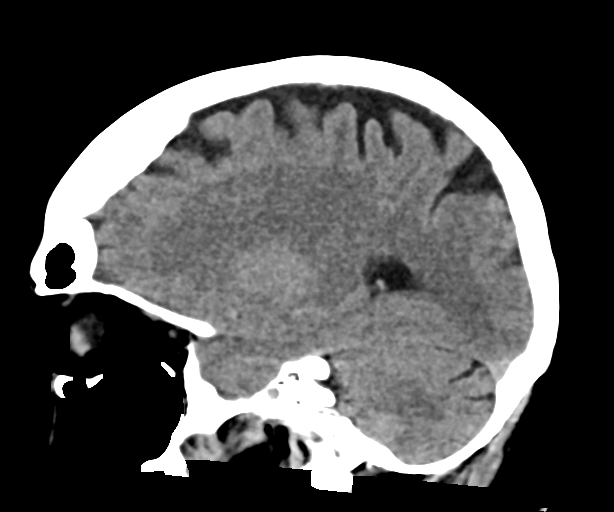

[17 of 47 positions shown; findings below may reference images not displayed]

FINDINGS: Brain: The ventricles and sulci appropriate size for patient's age.
The gray-white matter discrimination is preserved. There is no acute
intracranial hemorrhage. No mass effect or midline shift. No
extra-axial fluid collection.

Vascular: No hyperdense vessel or unexpected calcification.

Skull: Normal. Negative for fracture or focal lesion.

Sinuses/Orbits: No acute finding.

Other: None
IMPRESSION: No acute intracranial pathology.

## 2019-10-06 NOTE — Progress Notes (Signed)
Virtual Visit via Telephone Note  I connected with  April Lowery on 10/06/19 at  2:45 PM EDT by telephone and verified that I am speaking with the correct person using two identifiers.  Medicare Annual Wellness visit completed telephonically due to Covid-19 pandemic.   Persons participating in this call: This Health Coach and this patient.   Location: Patient: Home Provider: Office   I discussed the limitations, risks, security and privacy concerns of performing an evaluation and management service by telephone and the availability of in person appointments. The patient expressed understanding and agreed to proceed.  Unable to perform video visit due to video visit attempted and failed and/or patient does not have video capability.   Some vital signs may be absent or patient reported.   Willette Brace, LPN    Subjective:   April Lowery is a 74 y.o. female who presents for an Initial Medicare Annual Wellness Visit.  Review of Systems     Cardiac Risk Factors include: advanced age (>54men, >19 women);dyslipidemia     Objective:    There were no vitals filed for this visit. There is no height or weight on file to calculate BMI.  Advanced Directives 10/06/2019  Does Patient Have a Medical Advance Directive? Yes  Type of Paramedic of [place];Living will  Copy of [place] in Chart? No - copy requested    Current Medications (verified) Outpatient Encounter Medications as of 10/06/2019  Medication Sig  . acetaminophen (TYLENOL) 650 MG CR tablet Take 650 mg by mouth every 8 (eight) hours as needed for pain.  . cholecalciferol (VITAMIN D3) 25 MCG (1000 UT) tablet Take 1,000 Units by mouth daily.  . clopidogrel (PLAVIX) 75 MG tablet Take 1 tablet (75 mg total) by mouth daily.  . Cyanocobalamin (B-12) 2500 MCG TABS Take 1 tablet by mouth daily.  Marland Kitchen ezetimibe (ZETIA) 10 MG tablet Take 1 tablet (10 mg total) by mouth daily.  Marland Kitchen  lisinopril (ZESTRIL) 5 MG tablet Take 5 mg by mouth daily.  . meclizine (ANTIVERT) 12.5 MG tablet Take 1 tablet (12.5 mg total) by mouth 3 (three) times daily as needed for dizziness.  . metoprolol succinate (TOPROL-XL) 50 MG 24 hr tablet Take 1 tablet (50 mg total) by mouth daily. Take with or immediately following a meal.  . omeprazole (PRILOSEC) 20 MG capsule Take 20 mg by mouth as needed (acid reflux).   . rosuvastatin (CRESTOR) 40 MG tablet Take 1 tablet (40 mg total) by mouth daily. (Patient taking differently: Take 40 mg by mouth at bedtime. )  . sertraline (ZOLOFT) 50 MG tablet Take 1 tablet (50 mg total) by mouth daily.   No facility-administered encounter medications on file as of 10/06/2019.    Allergies (verified) Penicillins   History: Past Medical History:  Diagnosis Date  . Acute ST segment elevation myocardial infarction ([place])   . Arthritis   . CAD (coronary artery disease) 02/15/2019  . Cancer of left kidney ([place])   . Complete atrioventricular block ([facility])   . DVT (deep venous thrombosis) ([place]) 02/15/2019  . GI bleeding   . 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 HLD (hyperlipidemia) 02/15/2019  . HTN (hypertension) 02/15/2019  . Ischemic cardiomyopathy   . Melena   . Pacemaker   . Pulmonary thromboembolism ([place]) 02/15/2019   Past Surgical History:  Procedure Laterality Date  . CHOLECYSTECTOMY    . CORONARY ARTERY BYPASS GRAFT     STENTS  . PACEMAKER  INSERTION    . TONSILLECTOMY AND ADENOIDECTOMY  2017   Family History  Problem Relation Age of Onset  . Heart disease Mother   . Heart attack Mother   . Heart disease Father   . Heart attack Father   . Heart disease Brother   . Heart attack Brother   . Heart attack Maternal Grandmother   . Heart attack Maternal Grandfather   . Heart attack Paternal Grandmother   . Heart attack Paternal Grandfather    Social History   Socioeconomic History  . Marital status: Widowed    Spouse name: Not on file  .  Number of children: 4  . Years of education: Not on file  . Highest education level: Not on file  Occupational History  . Occupation: retired  Tobacco Use  . Smoking status: Former Research scientist (life sciences)  . Smokeless tobacco: Never Used  Vaping Use  . Vaping Use: Never used  Substance and Sexual Activity  . Alcohol use: Never  . Drug use: Never  . Sexual activity: Not on file    Comment: MARRIED  Other Topics Concern  . Not on file  Social History Narrative  . Not on file   Social Determinants of Health   Financial Resource Strain: Low Risk   . Difficulty of Paying Living Expenses: Not hard at all  Food Insecurity: No Food Insecurity  . Worried About Charity fundraiser in the Last Year: Never true  . Ran Out of Food in the Last Year: Never true  Transportation Needs: No Transportation Needs  . Lack of Transportation (Medical): No  . Lack of Transportation (Non-Medical): No  Physical Activity: Sufficiently Active  . Days of Exercise per Week: 5 days  . Minutes of Exercise per Session: 30 min  Stress: No Stress Concern Present  . Feeling of Stress : Not at all  Social Connections: Socially Isolated  . Frequency of Communication with Friends and Family: More than three times a week  . Frequency of Social Gatherings with Friends and Family: More than three times a week  . Attends Religious Services: Never  . Active Member of Clubs or Organizations: No  . Attends Archivist Meetings: Never  . Marital Status: Widowed    Tobacco Counseling Counseling given: Not Answered   Clinical Intake:  Pre-visit preparation completed: Yes  Pain : No/denies pain     BMI - recorded: 28.98 Nutritional Risks: Nausea/ vomitting/ diarrhea, Other (Comment) (numbness in right leg) Diabetes: No  How often do you need to have someone help you when you read instructions, pamphlets, or other written materials from your doctor or pharmacy?: 1 - Never  Diabetic?No  Interpreter Needed?:  No  Information entered by :: Dayton Martes   Activities of Daily Living In your present state of health, do you have any difficulty performing the following activities: 10/06/2019 09/29/2019  Hearing? N N  Vision? N N  Difficulty concentrating or making decisions? Y N  Walking or climbing stairs? N N  Dressing or bathing? N N  Doing errands, shopping? N N  Preparing Food and eating ? N -  Using the Toilet? N -  In the past six months, have you accidently leaked urine? N -  Do you have problems with loss of bowel control? N -  Managing your Medications? N -  Managing your Finances? N -  Housekeeping or managing your Housekeeping? N -  Some recent data might be hidden    Patient Care Team: [place],  Malka So, MD as PCP - General (Family Medicine) Evans Lance, MD as PCP - Cardiology (Cardiology)  Indicate any recent Medical Services you may have received from other than [facility] providers in the past year (date may be approximate).     Assessment:   This is a routine wellness examination for April Lowery.  Hearing/Vision screen  Hearing Screening   125Hz  250Hz  500Hz  1000Hz  2000Hz  3000Hz  4000Hz  6000Hz  8000Hz   Right ear:           Left ear:           Comments: Pt denies any difficulty hearing at this time  Vision Screening Comments: Pt wears eyeglasses and in need of eye Dr  Dietary issues and exercise activities discussed: Current Exercise Habits: Home exercise routine, Type of exercise: walking, Time (Minutes): 30, Frequency (Times/Week): 5, Weekly Exercise (Minutes/Week): 150, Exercise limited by: Other - see comments (pt states dizzyness and nausea)  Goals    . Exercise 3x per week (30 min per time)     Low impact exercise 3-4 times per week. Ideal for you aquatic exercises.  Continue a low fat and low salt diet.      Depression Screen PHQ 2/9 Scores 10/06/2019 09/30/2019 02/24/2019  PHQ - 2 Score 0 0 0    Fall Risk Fall Risk  10/06/2019 09/30/2019 02/24/2019  Falls in  the past year? 0 0 0  Number falls in past yr: 0 0 1  Injury with Fall? 0 0 0  Risk for fall due to : Impaired balance/gait;Impaired vision;Impaired mobility - -  Follow up Falls prevention discussed Education provided Education provided    Any stairs in or around the home? Yes  If so, are there any without handrails? No  Home free of loose throw rugs in walkways, pet beds, electrical cords, etc? Yes  Adequate lighting in your home to reduce risk of falls? Yes   ASSISTIVE DEVICES UTILIZED TO PREVENT FALLS:  Life alert? No  Use of a cane, walker or w/c? Yes  Grab bars in the bathroom? No Shower chair or bench in shower? No  Elevated toilet seat or a handicapped toilet? Yes   TIMED UP AND GO:  Was the test performed? No .   Cognitive Function:     6CIT Screen 10/06/2019  What Year? 0 points  What month? 0 points  What time? 0 points  Count back from 20 0 points  Months in reverse 0 points  Repeat phrase 0 points  Total Score 0    Immunizations Immunization History  Administered Date(s) Administered  . PFIZER SARS-COV-2 Vaccination 07/05/2019, 07/27/2019    TDAP status: Due, Education has been provided regarding the importance of this vaccine. Advised may receive this vaccine at local pharmacy or Health Dept. Aware to provide a copy of the vaccination record if obtained from local pharmacy or Health Dept. Verbalized acceptance and understanding. Flu Vaccine status: Declined, Education has been provided regarding the importance of this vaccine but patient still declined. Advised may receive this vaccine at local pharmacy or Health Dept. Aware to provide a copy of the vaccination record if obtained from local pharmacy or Health Dept. Verbalized acceptance and understanding. Pneumococcal vaccine status: Declined,  Education has been provided regarding the importance of this vaccine but patient still declined. Advised may receive this vaccine at local pharmacy or Health Dept.  Aware to provide a copy of the vaccination record if obtained from local pharmacy or Health Dept. Verbalized acceptance and understanding.  Covid-19  vaccine status: Completed vaccines  Qualifies for Shingles Vaccine? Yes   Zostavax completed No   Shingrix Completed?: No.    Education has been provided regarding the importance of this vaccine. Patient has been advised to call insurance company to determine out of pocket expense if they have not yet received this vaccine. Advised may also receive vaccine at local pharmacy or Health Dept. Verbalized acceptance and understanding.  Screening Tests Health Maintenance  Topic Date Due  . HEMOGLOBIN A1C  Never done  . FOOT EXAM  Never done  . OPHTHALMOLOGY EXAM  Never done  . MAMMOGRAM  Never done  . Hepatitis C Screening  03/16/2020 (Originally [birth year])  . DEXA SCAN  09/29/2020 (Originally 07/30/2012)  . INFLUENZA VACCINE  10/24/2019  . COLONOSCOPY  09/24/2024  . COVID-19 Vaccine  Completed  . TETANUS/TDAP  Discontinued  . PNA vac Low Risk Adult  Discontinued    Health Maintenance  Health Maintenance Due  Topic Date Due  . HEMOGLOBIN A1C  Never done  . FOOT EXAM  Never done  . OPHTHALMOLOGY EXAM  Never done  . MAMMOGRAM  Never done    Colorectal cancer screening: Completed 09/25/14. Repeat every 10 years Mammogram: Due Bone density status: Due  Additional Screening:  Hepatitis C Screening: does qualify  Vision Screening: Recommended annual ophthalmology exams for early detection of glaucoma and other disorders of the eye. Is the patient up to date with their annual eye exam?  No If pt is not established with a provider, would they like to be referred to a provider to establish care? Yes .   Dental Screening: Recommended annual dental exams for proper oral hygiene  Community Resource Referral / Chronic Care Management: CRR required this visit?  No   CCM required this visit?  No      Plan:     I have personally reviewed  and noted the following in the patient's chart:   . Medical and social history . Use of alcohol, tobacco or illicit drugs  . Current medications and supplements . Functional ability and status . Nutritional status . Physical activity . Advanced directives . List of other physicians . Hospitalizations, surgeries, and ER visits in previous 12 months . Vitals . Screenings to include cognitive, depression, and falls . Referrals and appointments  In addition, I have reviewed and discussed with patient certain preventive protocols, quality metrics, and best practice recommendations. A written personalized care plan for preventive services as well as general preventive health recommendations were provided to patient.     Willette Brace, LPN   [id number]   Nurse Notes: Pt declines any vaccinations or test ordered until she follows up with Dr [place] on Friday's office visit.

## 2019-10-06 NOTE — ED Triage Notes (Signed)
Patient in POV, LKW 10PM. Reports she woke up with R leg numbness. No other neuro symptoms. Shawn PA at bedside

## 2019-10-06 NOTE — Patient Instructions (Addendum)
April Lowery , Thank you for taking time to come for your Medicare Wellness Visit. I appreciate your ongoing commitment to your health goals. Please review the following plan we discussed and let me know if I can assist you in the future.   Screening recommendations/referrals: Colonoscopy: Done 09/25/14 Mammogram: Due Bone Density: Due Recommended yearly ophthalmology/optometry visit for glaucoma screening and checkup Recommended yearly dental visit for hygiene and checkup  Vaccinations: Influenza vaccine: Due Pneumococcal vaccine: Due Tdap vaccine: Due Shingles vaccine: Shingrix discussed. Please contact your pharmacy for coverage information.    Covid-19:Completed   Advanced directives: Please bring a copy of your health care power of attorney and living will to the office at your convenience.  Conditions/risks identified: none  Next appointment: Follow up in one year for your annual wellness visit    Preventive Care 65 Years and Older, Female Preventive care refers to lifestyle choices and visits with your health care provider that can promote health and wellness. What does preventive care include?  A yearly physical exam. This is also called an annual well check.  Dental exams once or twice a year.  Routine eye exams. Ask your health care provider how often you should have your eyes checked.  Personal lifestyle choices, including:  Daily care of your teeth and gums.  Regular physical activity.  Eating a healthy diet.  Avoiding tobacco and drug use.  Limiting alcohol use.  Practicing safe sex.  Taking low-dose aspirin every day.  Taking vitamin and mineral supplements as recommended by your health care provider. What happens during an annual well check? The services and screenings done by your health care provider during your annual well check will depend on your age, overall health, lifestyle risk factors, and family history of disease. Counseling  Your health care  provider may ask you questions about your:  Alcohol use.  Tobacco use.  Drug use.  Emotional well-being.  Home and relationship well-being.  Sexual activity.  Eating habits.  History of falls.  Memory and ability to understand (cognition).  Work and work Statistician.  Reproductive health. Screening  You may have the following tests or measurements:  Height, weight, and BMI.  Blood pressure.  Lipid and cholesterol levels. These may be checked every 5 years, or more frequently if you are over 65 years old.  Skin check.  Lung cancer screening. You may have this screening every year starting at age 5 if you have a 30-pack-year history of smoking and currently smoke or have quit within the past 15 years.  Fecal occult blood test (FOBT) of the stool. You may have this test every year starting at age 12.  Flexible sigmoidoscopy or colonoscopy. You may have a sigmoidoscopy every 5 years or a colonoscopy every 10 years starting at age 69.  Hepatitis C blood test.  Hepatitis B blood test.  Sexually transmitted disease (STD) testing.  Diabetes screening. This is done by checking your blood sugar (glucose) after you have not eaten for a while (fasting). You may have this done every 1-3 years.  Bone density scan. This is done to screen for osteoporosis. You may have this done starting at age 62.  Mammogram. This may be done every 1-2 years. Talk to your health care provider about how often you should have regular mammograms. Talk with your health care provider about your test results, treatment options, and if necessary, the need for more tests. Vaccines  Your health care provider may recommend certain vaccines, such as:  Influenza  vaccine. This is recommended every year.  Tetanus, diphtheria, and acellular pertussis (Tdap, Td) vaccine. You may need a Td booster every 10 years.  Zoster vaccine. You may need this after age 57.  Pneumococcal 13-valent conjugate (PCV13)  vaccine. One dose is recommended after age 29.  Pneumococcal polysaccharide (PPSV23) vaccine. One dose is recommended after age 40. Talk to your health care provider about which screenings and vaccines you need and how often you need them. This information is not intended to replace advice given to you by your health care provider. Make sure you discuss any questions you have with your health care provider. Document Released: 04/07/2015 Document Revised: 11/29/2015 Document Reviewed: 01/10/2015 Elsevier Interactive Patient Education  2017 [place] Prevention in the Home Falls can cause injuries. They can happen to people of all ages. There are many things you can do to make your home safe and to help prevent falls. What can I do on the outside of my home?  Regularly fix the edges of walkways and driveways and fix any cracks.  Remove anything that might make you trip as you walk through a door, such as a raised step or threshold.  Trim any bushes or trees on the path to your home.  Use bright outdoor lighting.  Clear any walking paths of anything that might make someone trip, such as rocks or tools.  Regularly check to see if handrails are loose or broken. Make sure that both sides of any steps have handrails.  Any raised decks and porches should have guardrails on the edges.  Have any leaves, snow, or ice cleared regularly.  Use sand or salt on walking paths during winter.  Clean up any spills in your garage right away. This includes oil or grease spills. What can I do in the bathroom?  Use night lights.  Install grab bars by the toilet and in the tub and shower. Do not use towel bars as grab bars.  Use non-skid mats or decals in the tub or shower.  If you need to sit down in the shower, use a plastic, non-slip stool.  Keep the floor dry. Clean up any water that spills on the floor as soon as it happens.  Remove soap buildup in the tub or shower  regularly.  Attach bath mats securely with double-sided non-slip rug tape.  Do not have throw rugs and other things on the floor that can make you trip. What can I do in the bedroom?  Use night lights.  Make sure that you have a light by your bed that is easy to reach.  Do not use any sheets or blankets that are too big for your bed. They should not hang down onto the floor.  Have a firm chair that has side arms. You can use this for support while you get dressed.  Do not have throw rugs and other things on the floor that can make you trip. What can I do in the kitchen?  Clean up any spills right away.  Avoid walking on wet floors.  Keep items that you use a lot in easy-to-reach places.  If you need to reach something above you, use a strong step stool that has a grab bar.  Keep electrical cords out of the way.  Do not use floor polish or wax that makes floors slippery. If you must use wax, use non-skid floor wax.  Do not have throw rugs and other things on the floor that can  make you trip. What can I do with my stairs?  Do not leave any items on the stairs.  Make sure that there are handrails on both sides of the stairs and use them. Fix handrails that are broken or loose. Make sure that handrails are as long as the stairways.  Check any carpeting to make sure that it is firmly attached to the stairs. Fix any carpet that is loose or worn.  Avoid having throw rugs at the top or bottom of the stairs. If you do have throw rugs, attach them to the floor with carpet tape.  Make sure that you have a light switch at the top of the stairs and the bottom of the stairs. If you do not have them, ask someone to add them for you. What else can I do to help prevent falls?  Wear shoes that:  Do not have high heels.  Have rubber bottoms.  Are comfortable and fit you well.  Are closed at the toe. Do not wear sandals.  If you use a stepladder:  Make sure that it is fully  opened. Do not climb a closed stepladder.  Make sure that both sides of the stepladder are locked into place.  Ask someone to hold it for you, if possible.  Clearly mark and make sure that you can see:  Any grab bars or handrails.  First and last steps.  Where the edge of each step is.  Use tools that help you move around (mobility aids) if they are needed. These include:  Canes.  Walkers.  Scooters.  Crutches.  Turn on the lights when you go into a dark area. Replace any light bulbs as soon as they burn out.  Set up your furniture so you have a clear path. Avoid moving your furniture around.  If any of your floors are uneven, fix them.  If there are any pets around you, be aware of where they are.  Review your medicines with your doctor. Some medicines can make you feel dizzy. This can increase your chance of falling. Ask your doctor what other things that you can do to help prevent falls. This information is not intended to replace advice given to you by your health care provider. Make sure you discuss any questions you have with your health care provider. Document Released: 01/05/2009 Document Revised: 08/17/2015 Document Reviewed: 04/15/2014 Elsevier Interactive Patient Education  2017 [employer].

## 2019-10-06 NOTE — ED Provider Notes (Signed)
Patient placed in Quick Look pathway, seen and evaluated   Chief Complaint: Right leg numbness  HPI:   Woke up with right leg numbness around 7 AM this morning. Went to bed without extremity symptoms at 10 PM last night. Evaluated for dizziness and worked up for stroke July 12. Denies falls/trauma, back pain, saddle anesthesias, changes in bowel or bladder function, changes in speaking or swallowing.  ROS: Right leg numbness (one)  Physical Exam:   Gen: No distress  Neuro: Awake and Alert  Skin: Warm    Focused Exam:   Grip strength equal bilaterally. Strength 5/5 in each of the extremities and equal bilaterally. No tenderness to the lower back. Endorses decreased sensation to light touch through the right leg. Sensation light touch grossly intact in the upper extremities and the left leg. No abdominal tenderness.   Initiation of care has begun. The patient has been counseled on the process, plan, and necessity for staying for the completion/evaluation, and the remainder of the medical screening examination   Layla Maw 10/06/19 [place], [place], DO 10/06/19 [id number]

## 2019-10-07 ENCOUNTER — Emergency Department (HOSPITAL_COMMUNITY): Payer: Medicare Other

## 2019-10-07 ENCOUNTER — Inpatient Hospital Stay (HOSPITAL_COMMUNITY): Payer: Medicare Other

## 2019-10-07 ENCOUNTER — Other Ambulatory Visit: Payer: Medicare Other

## 2019-10-07 ENCOUNTER — Encounter: Payer: Self-pay | Admitting: Family Medicine

## 2019-10-07 DIAGNOSIS — Q998 Other specified chromosome abnormalities: Secondary | ICD-10-CM | POA: Diagnosis not present

## 2019-10-07 DIAGNOSIS — E785 Hyperlipidemia, unspecified: Secondary | ICD-10-CM | POA: Diagnosis present

## 2019-10-07 DIAGNOSIS — I2510 Atherosclerotic heart disease of native coronary artery without angina pectoris: Secondary | ICD-10-CM | POA: Diagnosis present

## 2019-10-07 DIAGNOSIS — Z87891 Personal history of nicotine dependence: Secondary | ICD-10-CM | POA: Diagnosis not present

## 2019-10-07 DIAGNOSIS — R270 Ataxia, unspecified: Secondary | ICD-10-CM | POA: Diagnosis present

## 2019-10-07 DIAGNOSIS — I442 Atrioventricular block, complete: Secondary | ICD-10-CM | POA: Diagnosis present

## 2019-10-07 DIAGNOSIS — I639 Cerebral infarction, unspecified: Secondary | ICD-10-CM | POA: Diagnosis present

## 2019-10-07 DIAGNOSIS — I6302 Cerebral infarction due to thrombosis of basilar artery: Secondary | ICD-10-CM | POA: Diagnosis not present

## 2019-10-07 DIAGNOSIS — Z86711 Personal history of pulmonary embolism: Secondary | ICD-10-CM | POA: Diagnosis not present

## 2019-10-07 DIAGNOSIS — E782 Mixed hyperlipidemia: Secondary | ICD-10-CM | POA: Diagnosis present

## 2019-10-07 DIAGNOSIS — I6389 Other cerebral infarction: Secondary | ICD-10-CM | POA: Diagnosis not present

## 2019-10-07 DIAGNOSIS — Z20822 Contact with and (suspected) exposure to covid-19: Secondary | ICD-10-CM | POA: Diagnosis present

## 2019-10-07 DIAGNOSIS — I10 Essential (primary) hypertension: Secondary | ICD-10-CM | POA: Diagnosis present

## 2019-10-07 DIAGNOSIS — I6381 Other cerebral infarction due to occlusion or stenosis of small artery: Secondary | ICD-10-CM

## 2019-10-07 DIAGNOSIS — M1990 Unspecified osteoarthritis, unspecified site: Secondary | ICD-10-CM | POA: Diagnosis present

## 2019-10-07 DIAGNOSIS — I252 Old myocardial infarction: Secondary | ICD-10-CM | POA: Diagnosis not present

## 2019-10-07 DIAGNOSIS — Z86718 Personal history of other venous thrombosis and embolism: Secondary | ICD-10-CM | POA: Diagnosis not present

## 2019-10-07 DIAGNOSIS — Z955 Presence of coronary angioplasty implant and graft: Secondary | ICD-10-CM | POA: Diagnosis not present

## 2019-10-07 DIAGNOSIS — Z951 Presence of aortocoronary bypass graft: Secondary | ICD-10-CM | POA: Diagnosis not present

## 2019-10-07 DIAGNOSIS — I5020 Unspecified systolic (congestive) heart failure: Secondary | ICD-10-CM | POA: Insufficient documentation

## 2019-10-07 DIAGNOSIS — Z8249 Family history of ischemic heart disease and other diseases of the circulatory system: Secondary | ICD-10-CM | POA: Diagnosis not present

## 2019-10-07 DIAGNOSIS — Z880 Allergy status to penicillin: Secondary | ICD-10-CM | POA: Diagnosis not present

## 2019-10-07 DIAGNOSIS — Z79899 Other long term (current) drug therapy: Secondary | ICD-10-CM | POA: Diagnosis not present

## 2019-10-07 DIAGNOSIS — E119 Type 2 diabetes mellitus without complications: Secondary | ICD-10-CM | POA: Diagnosis present

## 2019-10-07 DIAGNOSIS — Z85528 Personal history of other malignant neoplasm of kidney: Secondary | ICD-10-CM | POA: Diagnosis not present

## 2019-10-07 DIAGNOSIS — R200 Anesthesia of skin: Secondary | ICD-10-CM | POA: Diagnosis present

## 2019-10-07 DIAGNOSIS — R29703 NIHSS score 3: Secondary | ICD-10-CM | POA: Diagnosis present

## 2019-10-07 DIAGNOSIS — Z7902 Long term (current) use of antithrombotics/antiplatelets: Secondary | ICD-10-CM | POA: Diagnosis not present

## 2019-10-07 DIAGNOSIS — I255 Ischemic cardiomyopathy: Secondary | ICD-10-CM | POA: Diagnosis present

## 2019-10-07 DIAGNOSIS — Z950 Presence of cardiac pacemaker: Secondary | ICD-10-CM | POA: Diagnosis not present

## 2019-10-07 LAB — ECHOCARDIOGRAM COMPLETE
Area-P 1/2: 2.95 cm2
S' Lateral: 3.5 cm

## 2019-10-07 LAB — SARS CORONAVIRUS 2 BY RT PCR (HOSPITAL ORDER, PERFORMED IN ~~LOC~~ HOSPITAL LAB): SARS Coronavirus 2: NEGATIVE

## 2019-10-07 IMAGING — MR MR HEAD W/O CM
9 of 10 series · 38 of 48 positions shown · non-contrast
Comparison: [DATE]

CLINICAL DATA: Numbness and tingling right upper and lower
extremities

EXAM:
MRI HEAD WITHOUT CONTRAST
TECHNIQUE: Multiplanar, multiecho pulse sequences of the brain and surrounding
structures were obtained without intravenous contrast.

[Series 3: DWI · axial · 3.0mm · 1.09mm/px · z∈[-85,+53]mm · 11 of 94 slices shown (1 of 4)]
[im 1/94]
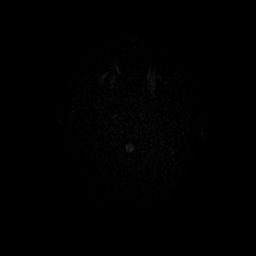
[im 10/94]
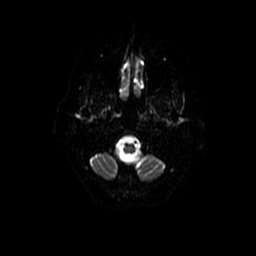
[im 19/94]
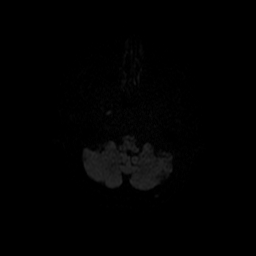
[im 28/94]
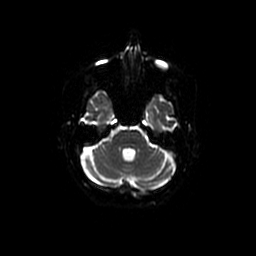
[im 38/94]
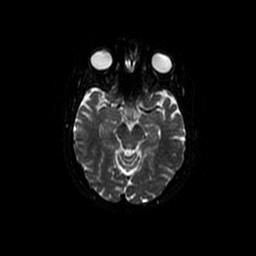
[im 47/94]
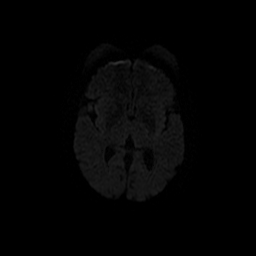
[im 56/94]
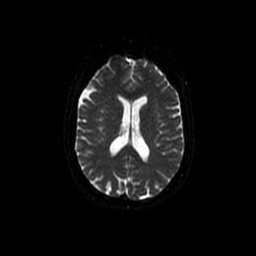
[im 66/94]
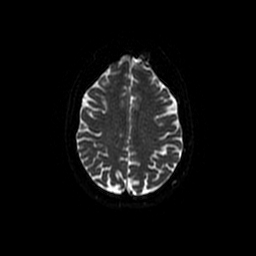
[im 75/94]
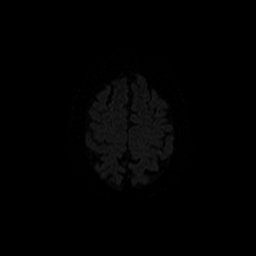
[im 84/94]
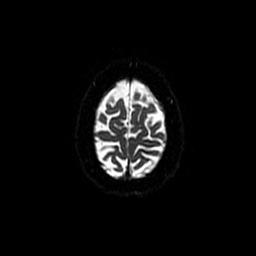
[im 94/94]
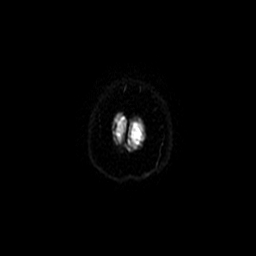

[Series 4: DWI · coronal · 5.0mm · 1.09mm/px · 7 of 68 slices shown (2 of 4)]
[im 1/68]
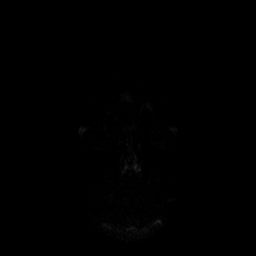
[im 12/68]
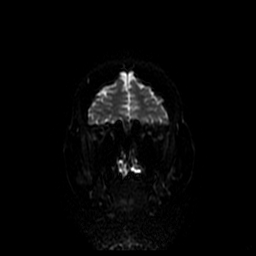
[im 23/68]
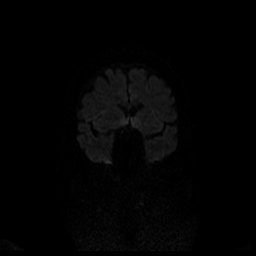
[im 34/68]
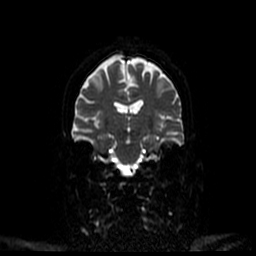
[im 45/68]
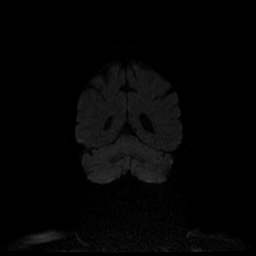
[im 56/68]
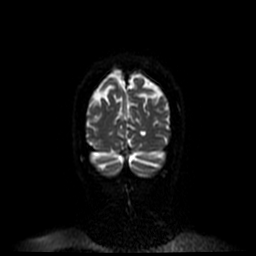
[im 68/68]
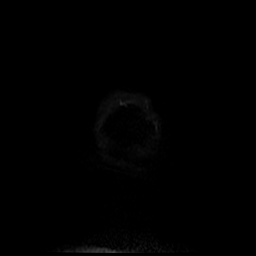

[Series 5: T1 · sagittal · 5.0mm · 0.47mm/px · 2 of 23 slices shown (1 of 2)]
[im 1/23]
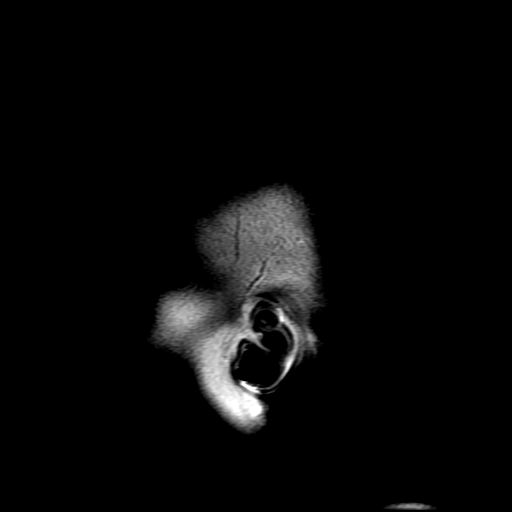
[im 23/23]
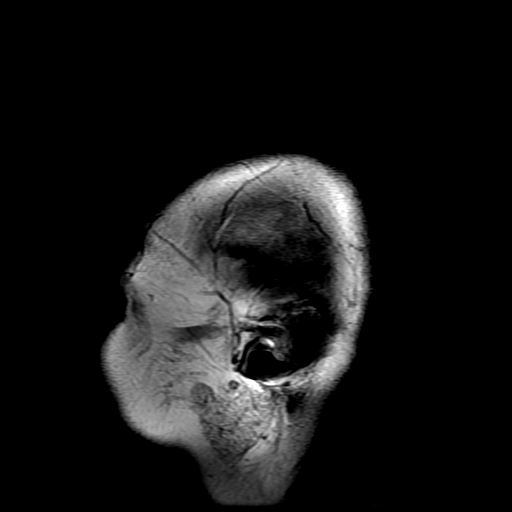

[Series 6: T2 · axial · 5.0mm · 0.43mm/px · z∈[-98,+38]mm · 2 of 24 slices shown (1 of 2)]
[im 1/24]
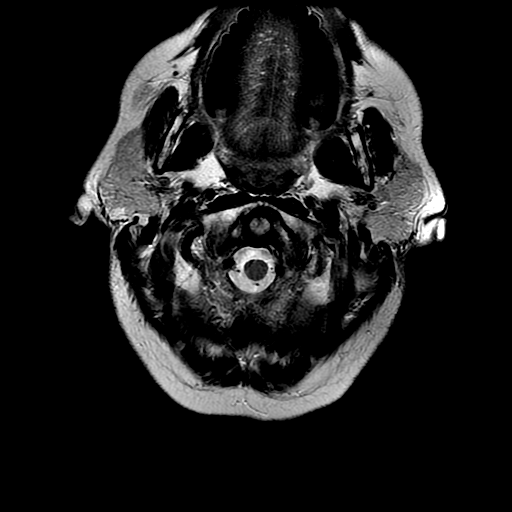
[im 24/24]
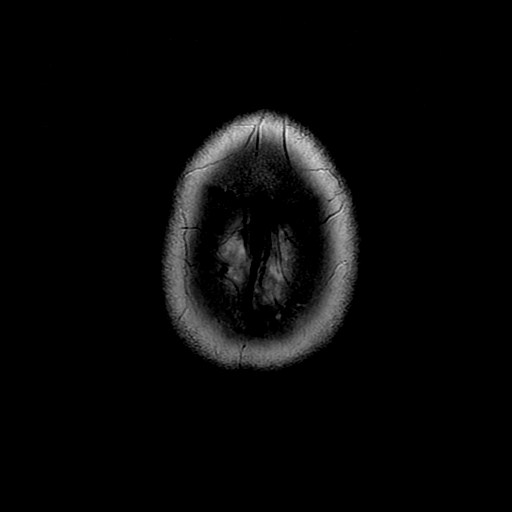

[Series 7: FLAIR · axial · 3.0mm · 0.43mm/px · z∈[-98,+38]mm · 2 of 24 slices shown]
[im 1/24]
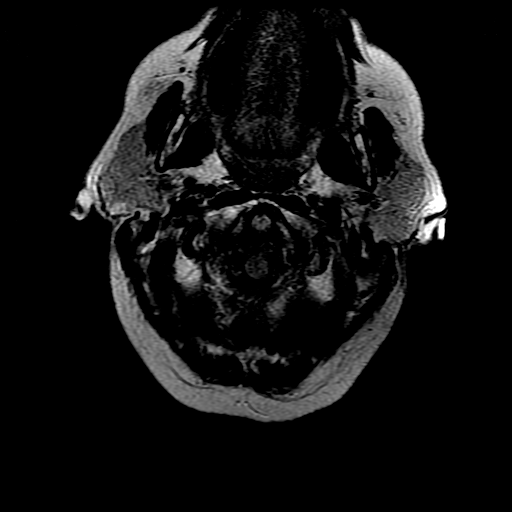
[im 24/24]
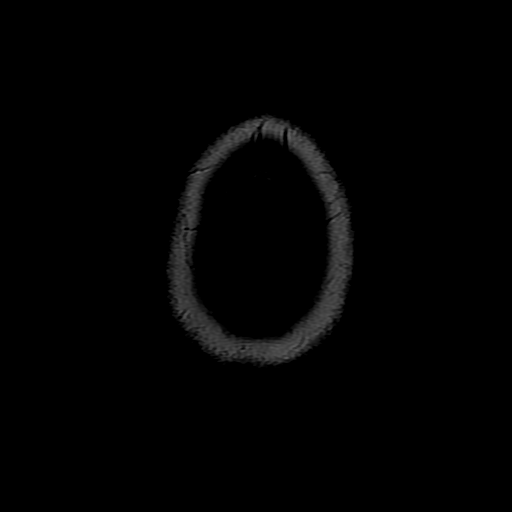

[Series 9: T1 · axial · 3.0mm · 0.47mm/px · z∈[-102,-85]mm · 2 of 100 slices shown (2 of 2)]
[im 1/100]
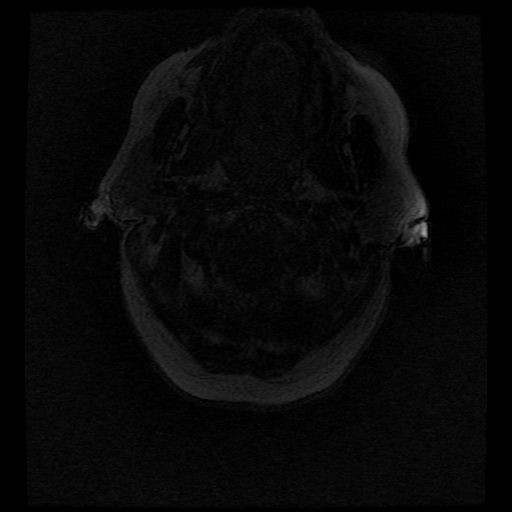
[im 12/100]
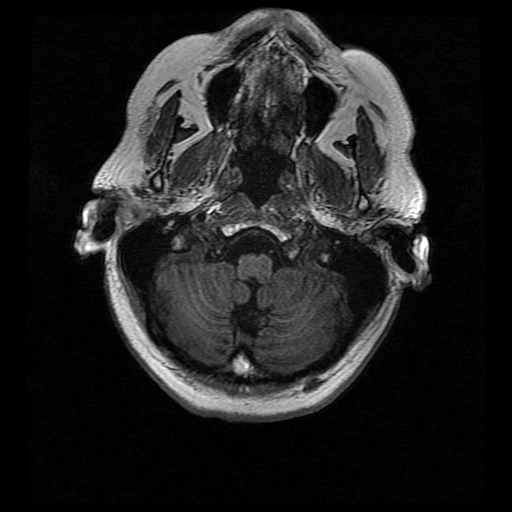

[Series 10: T2 · coronal · 5.0mm · 0.39mm/px · 3 of 26 slices shown (2 of 2)]
[im 1/26]
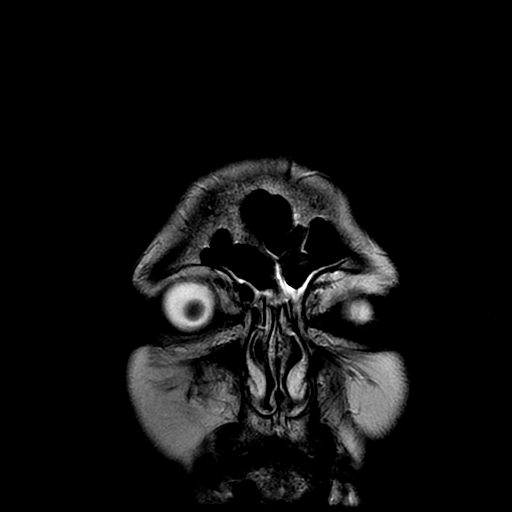
[im 13/26]
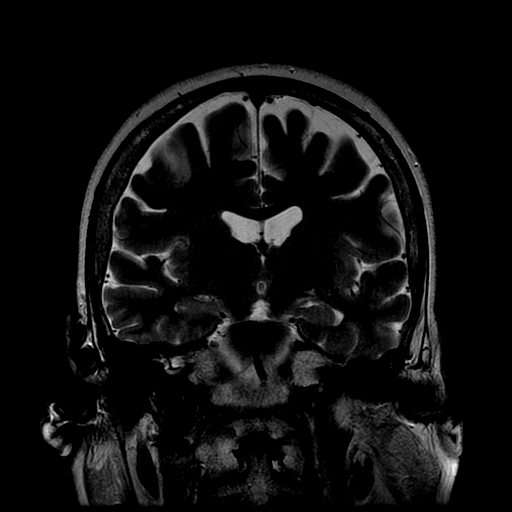
[im 26/26]
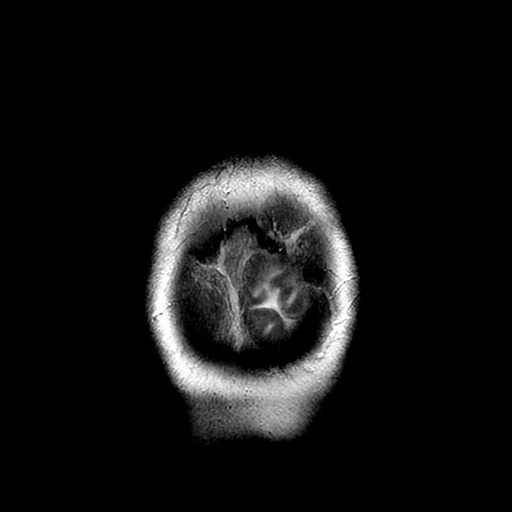

[Series 300: DWI · axial · 3.0mm · 1.09mm/px · z∈[-85,+53]mm · 5 of 47 slices shown (3 of 4)]
[im 1/47]
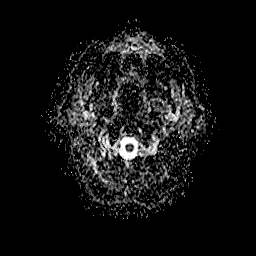
[im 12/47]
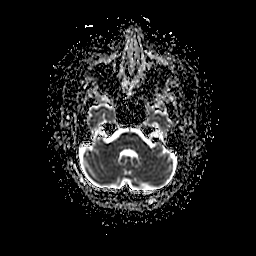
[im 24/47]
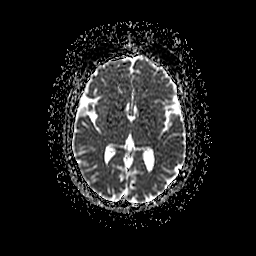
[im 35/47]
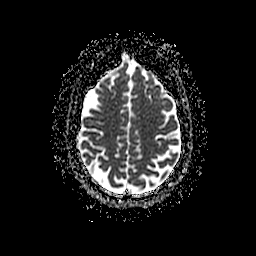
[im 47/47]
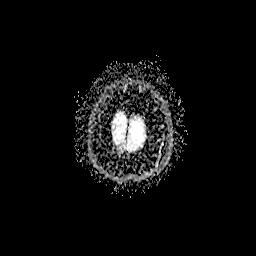

[Series 400: DWI · coronal · 5.0mm · 1.09mm/px · 4 of 34 slices shown (4 of 4)]
[im 1/34]
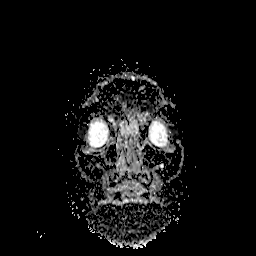
[im 12/34]
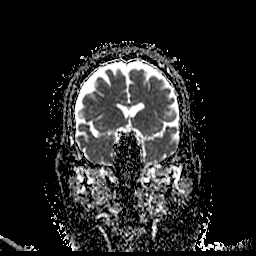
[im 23/34]
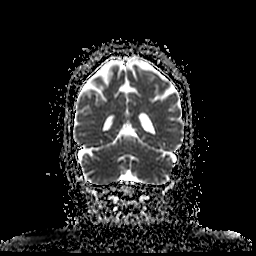
[im 34/34]
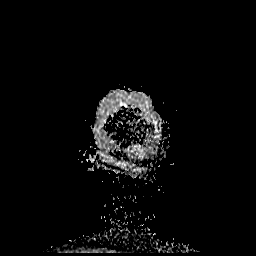

[38 of 48 positions shown; findings below may reference images not displayed]

FINDINGS: Brain: There is a tiny focus of diffusion and T2 hyperintensity at
the left dorsal aspect of the superior medulla. Adjacent focus
appears to be involving the opposing inferior cerebellum.
Corresponding ADC intensity difficult evaluate due to small size but
abnormal signal was not present on recent prior study. There is no
evidence of intracranial hemorrhage. There is no intracranial mass,
mass effect, or edema. There is no hydrocephalus or extra-axial
fluid collection. Small chronic left cerebellar infarcts again
noted.

Vascular: Major vessel flow voids at the skull base are preserved.

Skull and upper cervical spine: Normal marrow signal is preserved.

Sinuses/Orbits: Paranasal sinuses are aerated. Orbits are
unremarkable.

Other: Sella is unremarkable.  Mastoid air cells are clear.
IMPRESSION: Two punctate acute infarcts of the dorsal left superior medulla and
opposing inferior cerebellum.

Small chronic left cerebellar infarcts.

## 2019-10-07 IMAGING — CT CT ANGIO NECK
2 of 7 series · 8 of 33 positions shown · IV contrast (APPLIED)
Comparison: MRI head [DATE].  CT head [DATE]

CLINICAL DATA: Stroke.



[Series 5: cta neck/head · axial · 0.45mm/px · z∈[-213,-105]mm · 2 of 163 slices shown]
[im 55/163  soft-tissue]
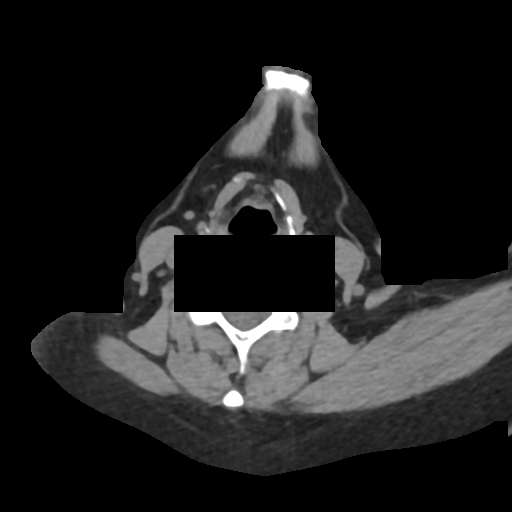
[im 109/163  soft-tissue]
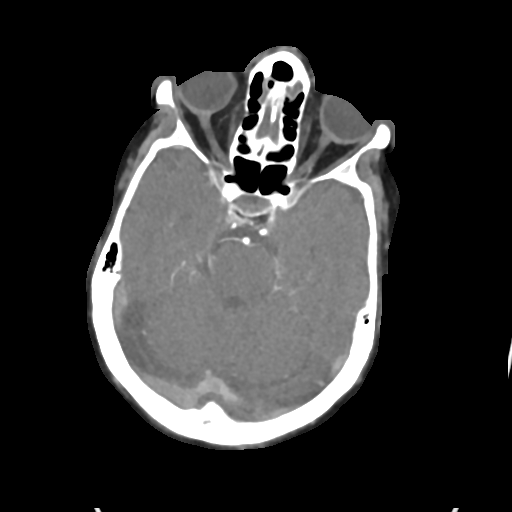

[Series 7: ax thins · axial · 0.39mm/px · z∈[-275,-43]mm · 6 of 326 slices shown]
[im 47/326  soft-tissue]
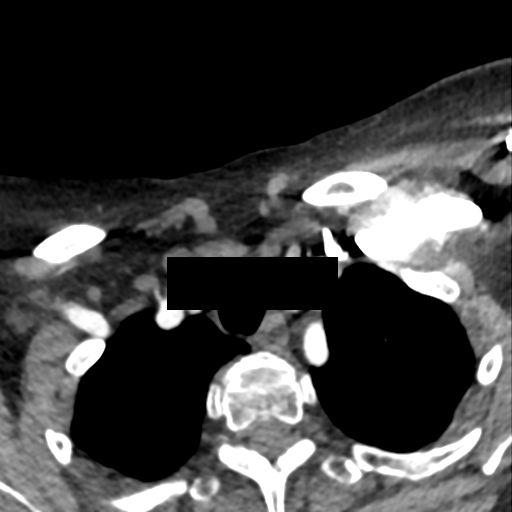
[im 93/326  bone]
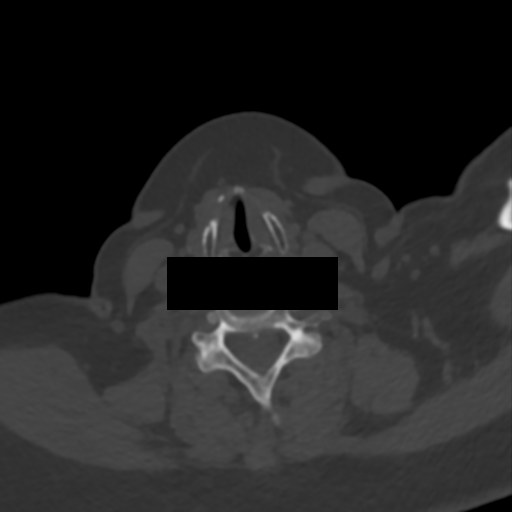
[im 140/326  soft-tissue]
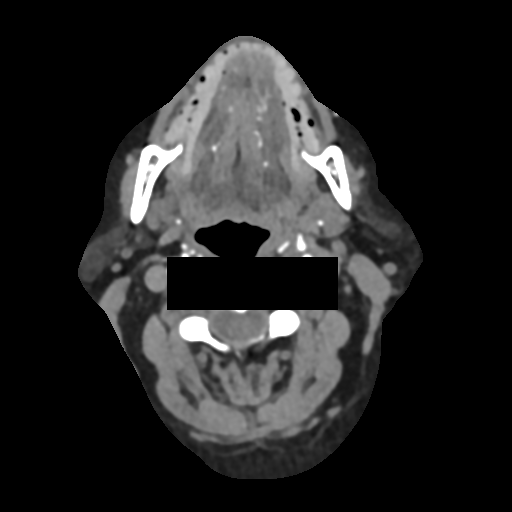
[im 186/326  bone]
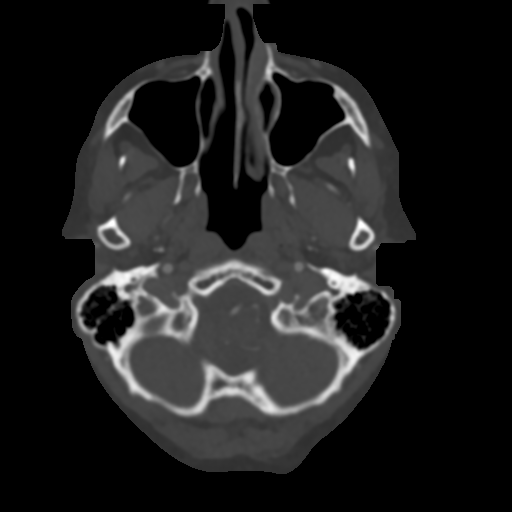
[im 233/326  soft-tissue]
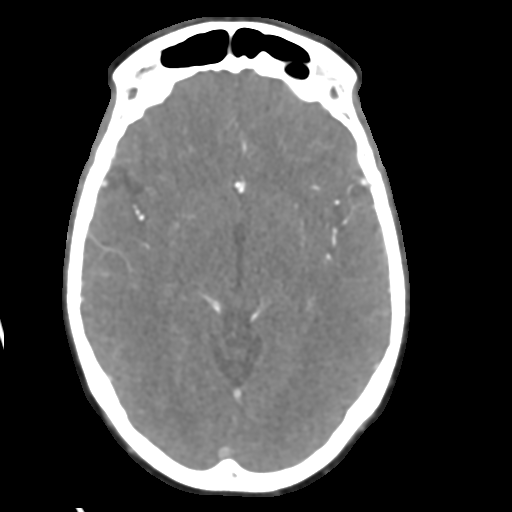
[im 279/326  bone]
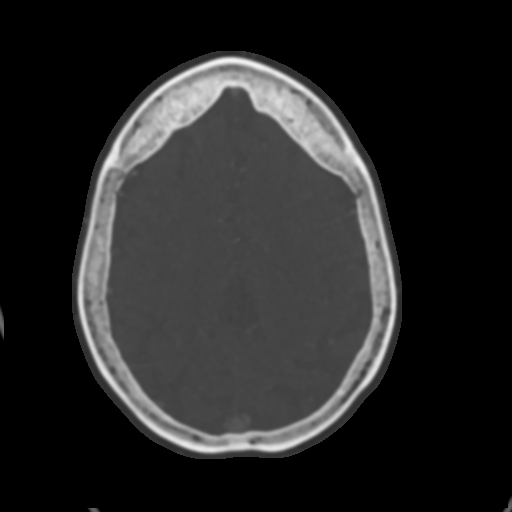

[8 of 33 positions shown; findings below may reference images not displayed]

FINDINGS: CTA NECK FINDINGS

Aortic arch: Atherosclerotic aortic arch. Mild atherosclerotic
disease proximal great vessels without significant stenosis.

Right carotid system: Mild atherosclerotic disease right carotid
bifurcation. 25% diameter stenosis proximal right internal carotid
artery. 50% diameter stenosis proximal right external carotid
artery.

Left carotid system: Atherosclerotic calcification left carotid
bifurcation. 50% diameter stenosis proximal left internal carotid
artery. Mild stenosis proximal left external carotid artery.

Vertebral arteries: Right vertebral artery dominant. Mild to
moderate calcific stenosis right vertebral artery at the skull base.
Right vertebral artery supplies the basilar.

Non dominant left vertebral artery occludes at the skull base due to
calcific plaque.

Skeleton: No acute skeletal abnormality.

Other neck: Negative for mass or adenopathy in the neck.

Upper chest: Lung apices clear bilaterally.

Review of the MIP images confirms the above findings

CTA HEAD FINDINGS

Anterior circulation: Atherosclerotic calcification cavernous
carotid bilaterally with moderate stenosis bilaterally. Anterior and
middle cerebral arteries patent bilaterally without significant
stenosis.

Posterior circulation: Mild to moderate stenosis distal right
vertebral artery. Left vertebral artery occludes at the skull base.
Small PICA patent bilaterally. Basilar patent without stenosis.
Superior cerebellar and posterior cerebral arteries patent
bilaterally without significant stenosis.

Venous sinuses: Normal venous enhancement

Anatomic variants: None

Review of the MIP images confirms the above findings
IMPRESSION: 1. Negative for intracranial large vessel occlusion.
2. Atherosclerotic disease and moderate stenosis in the cavernous
carotid bilaterally.
3. 25% diameter stenosis proximal right internal carotid artery and
50% diameter stenosis proximal left internal carotid artery
4. Mild to moderate stenosis distal right vertebral artery.
Occlusion distal left vertebral artery at the skull base.

## 2019-10-07 IMAGING — DX DG CHEST 2V
2 series · 2 of 2 positions shown · non-contrast
Comparison: [DATE].

CLINICAL DATA: Right-sided numbness.

EXAM:
CHEST - 2 VIEW

[chest lat]
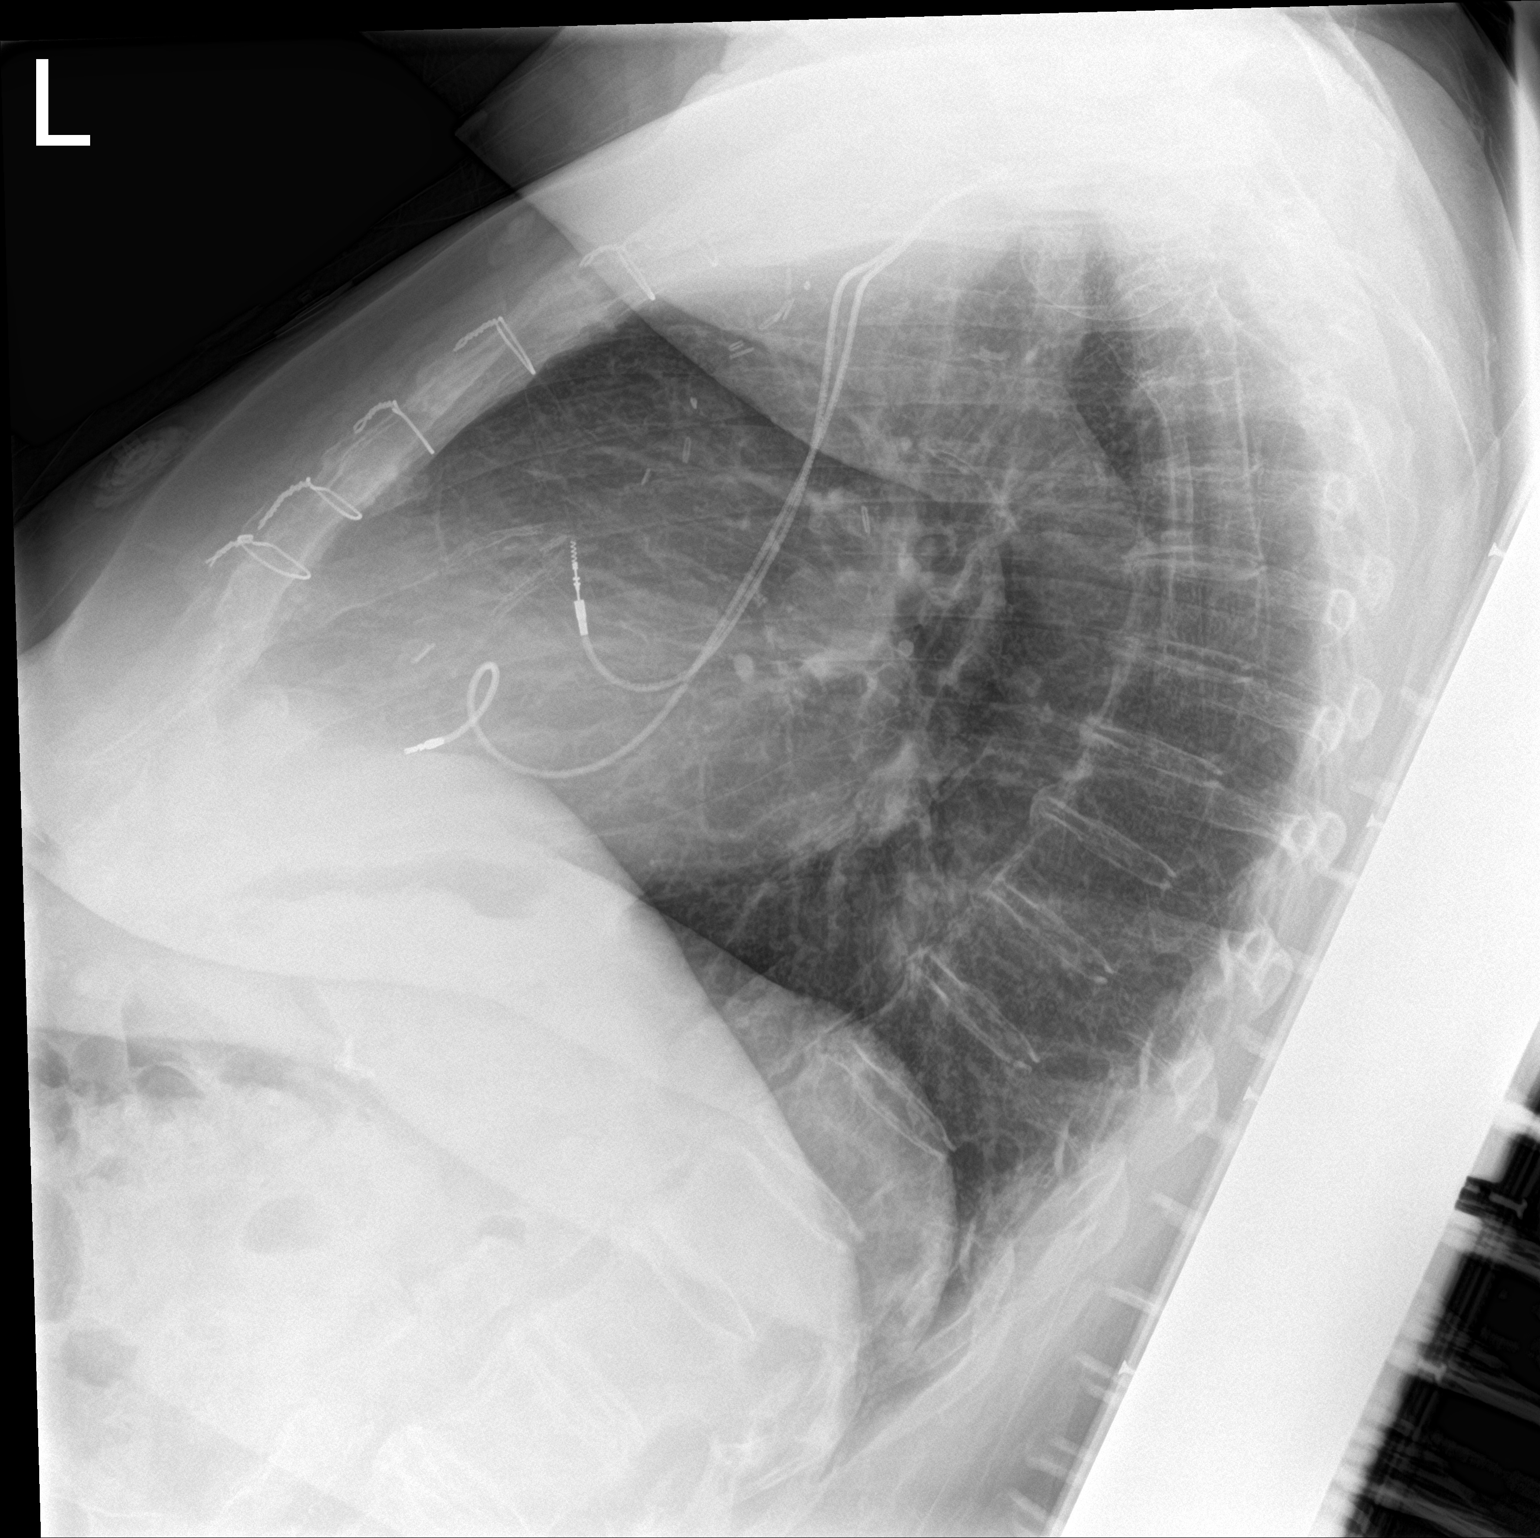

[chest ap]
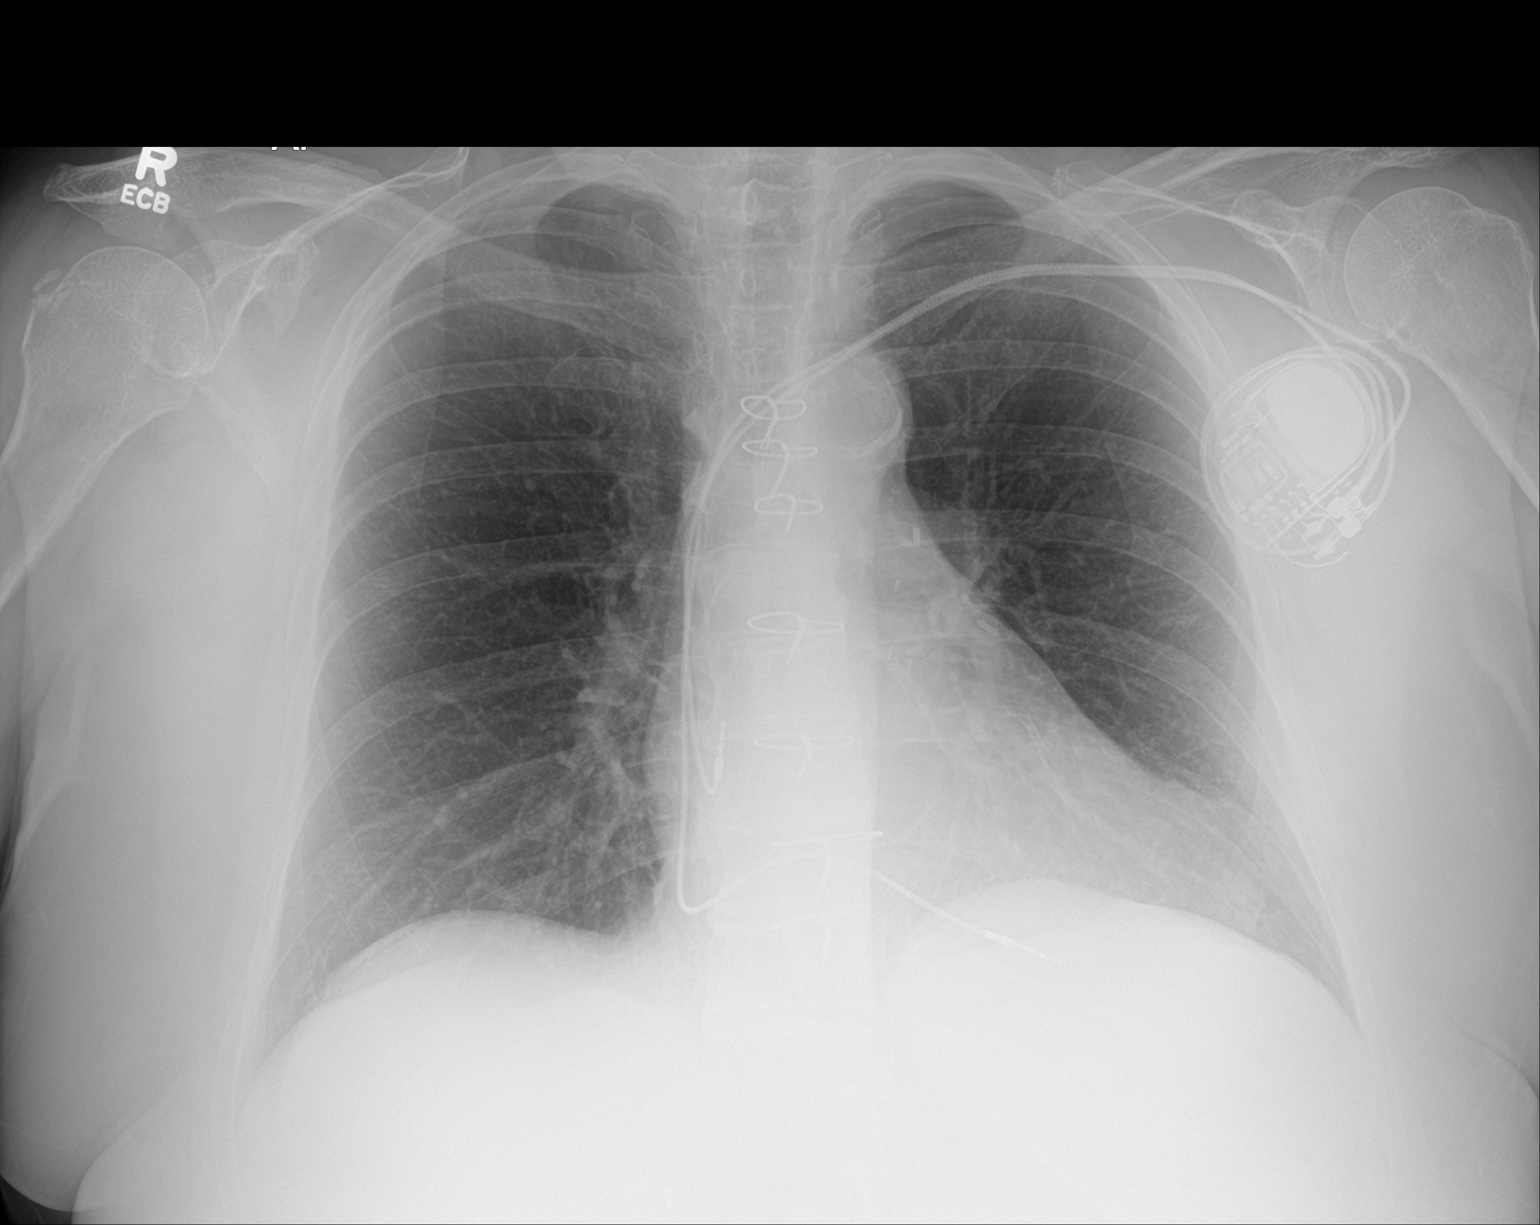

[2 of 2 positions shown; findings below may reference images not displayed]

FINDINGS: The heart size and mediastinal contours are within normal limits.
Both lungs are clear. No pneumothorax or pleural effusion is noted.
Left-sided pacemaker is unchanged in position. Sternotomy wires are
noted. The visualized skeletal structures are unremarkable.
IMPRESSION: No active cardiopulmonary disease.

Aortic Atherosclerosis ([12]-[12]).

## 2019-10-07 IMAGING — MR MR CERVICAL SPINE W/O CM
4 of 5 series · 19 of 48 positions shown · non-contrast
Comparison: None.

CLINICAL DATA: Right extremity numbness and tingling

EXAM:
MRI CERVICAL SPINE WITHOUT CONTRAST
TECHNIQUE: Multiplanar, multisequence MR imaging of the cervical spine was
performed. No intravenous contrast was administered.

[Series 2: T2 · sagittal · 3.0mm · 0.43mm/px · 6 of 14 slices shown (1 of 2)]
[im 1/14]
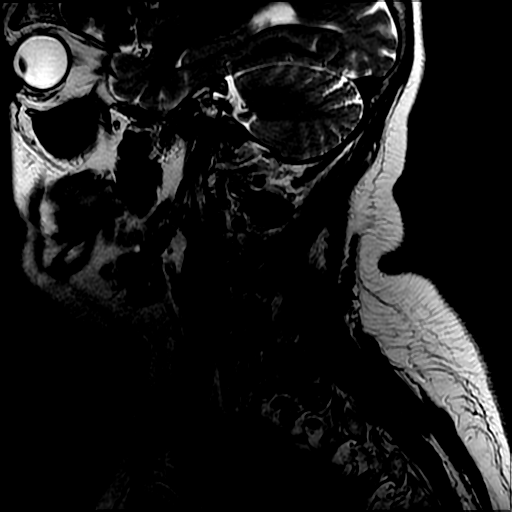
[im 3/14]
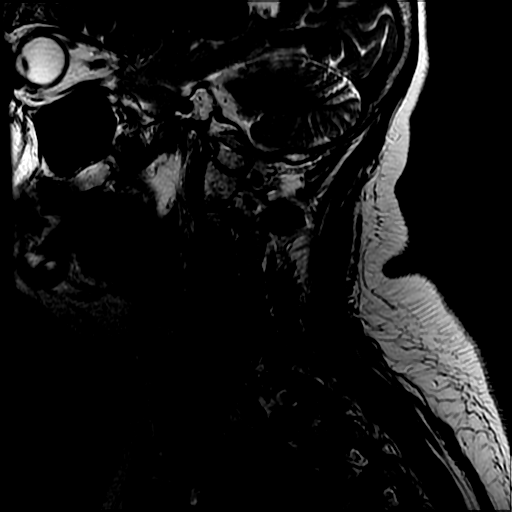
[im 6/14]
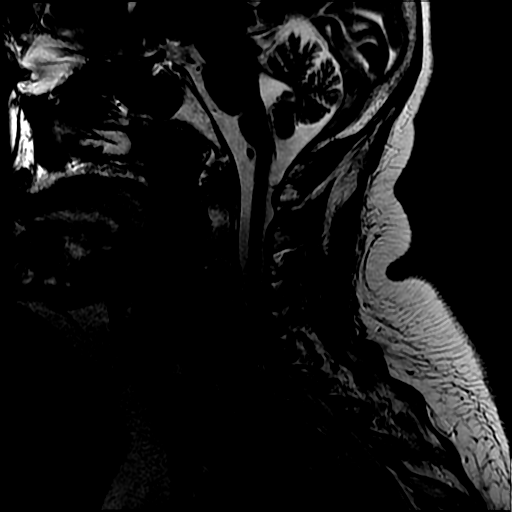
[im 8/14]
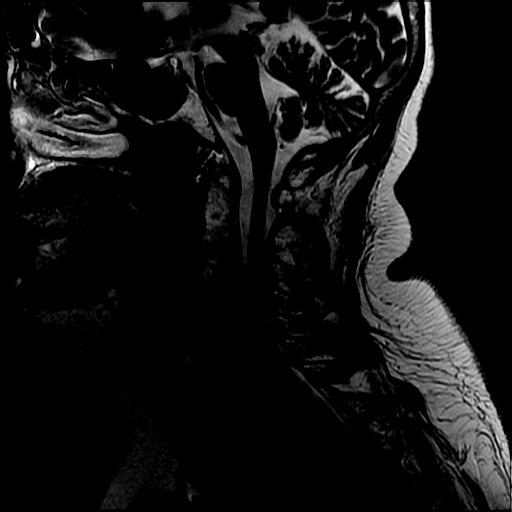
[im 11/14]
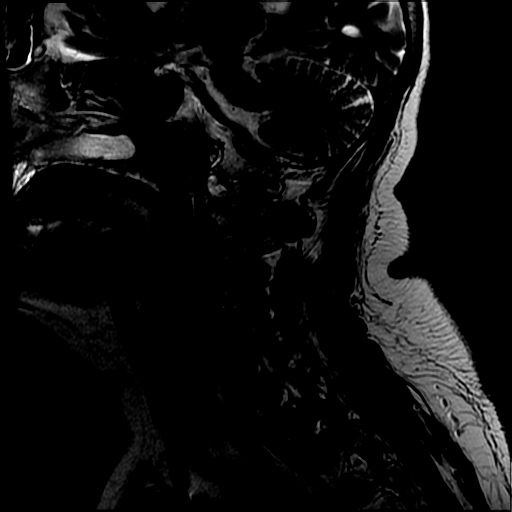
[im 14/14]
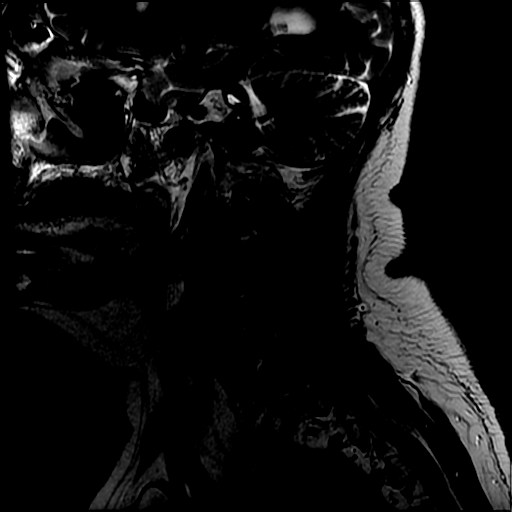

[Series 3: T1 · sagittal · 3.0mm · 0.43mm/px · 3 of 14 slices shown]
[im 3/14]
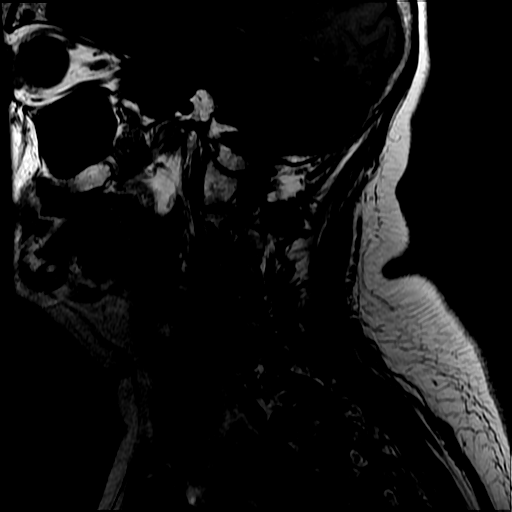
[im 7/14]
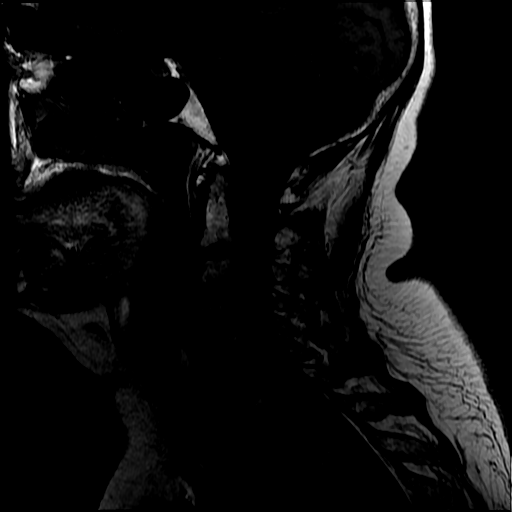
[im 11/14]
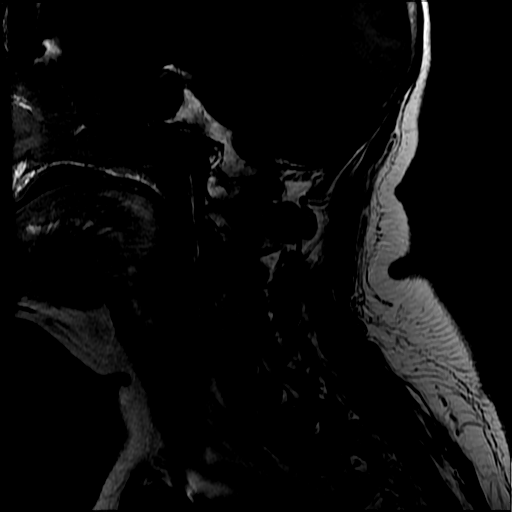

[Series 4: sag ir · sagittal · 3.0mm · 0.43mm/px · 3 of 14 slices shown]
[im 3/14]
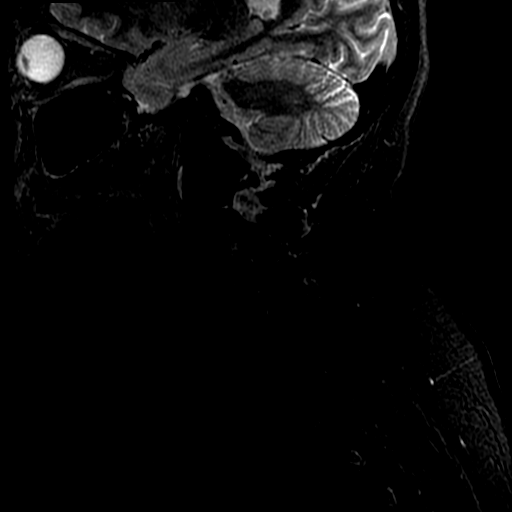
[im 7/14]
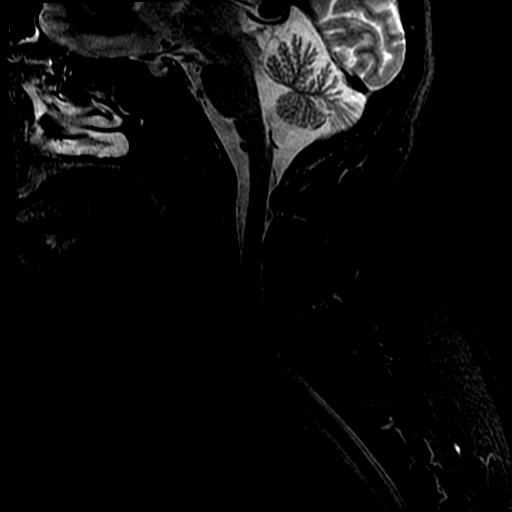
[im 11/14]
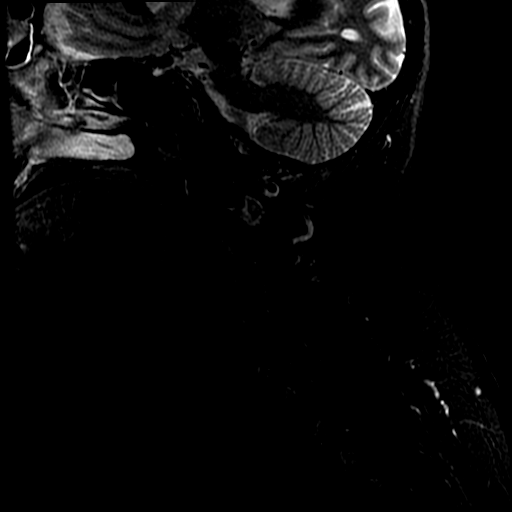

[Series 6: T2 · axial · 3.0mm · 0.39mm/px · z∈[-224,-149]mm · 7 of 28 slices shown (2 of 2)]
[im 1/28]
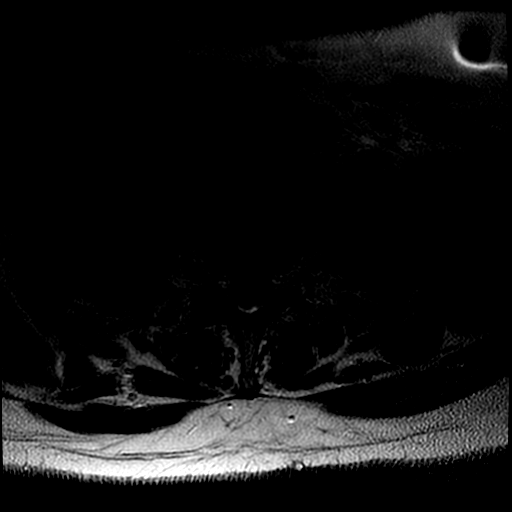
[im 5/28]
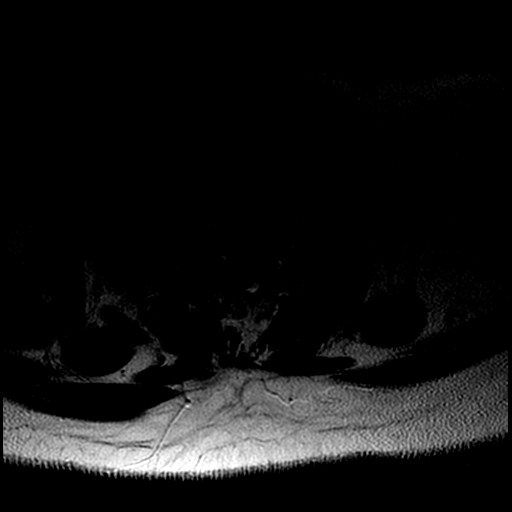
[im 9/28]
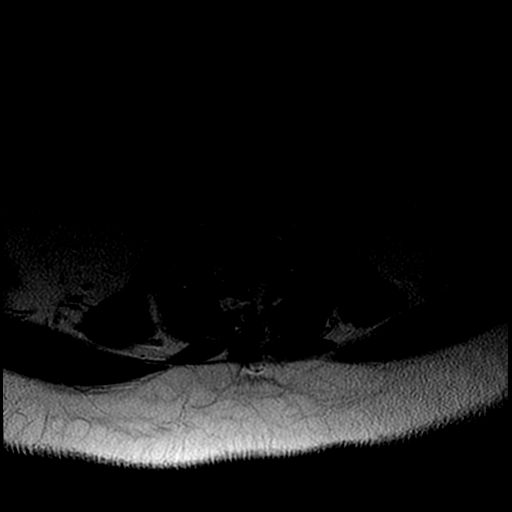
[im 13/28]
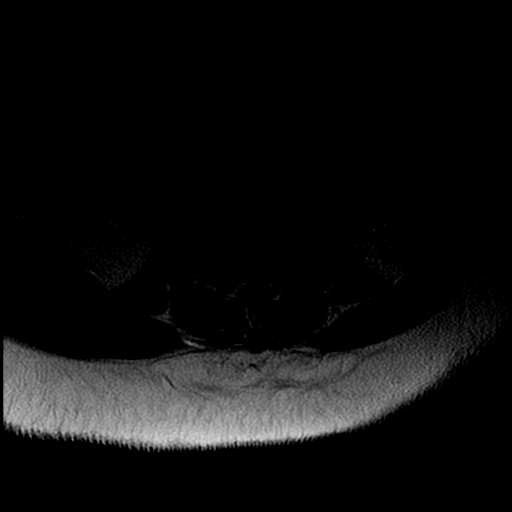
[im 15/28]
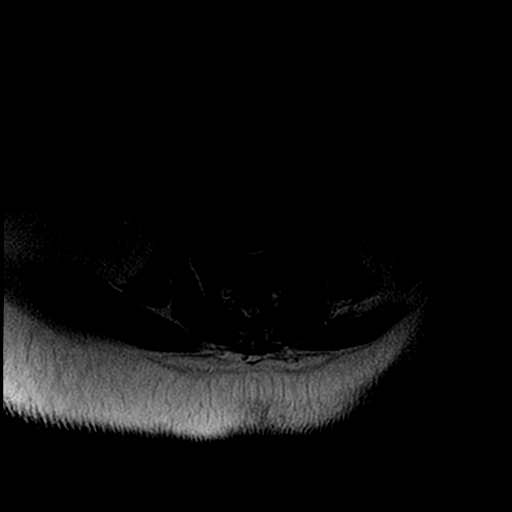
[im 19/28]
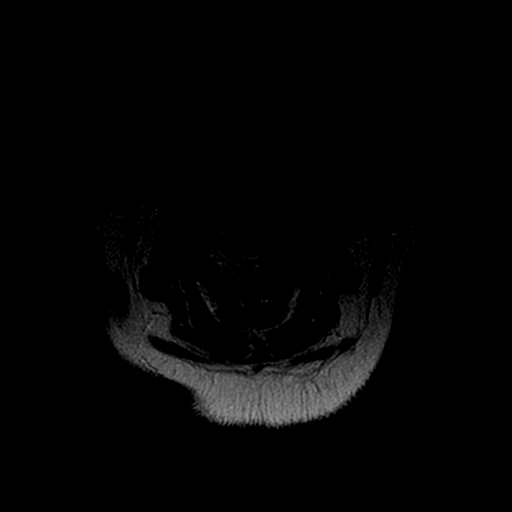
[im 23/28]
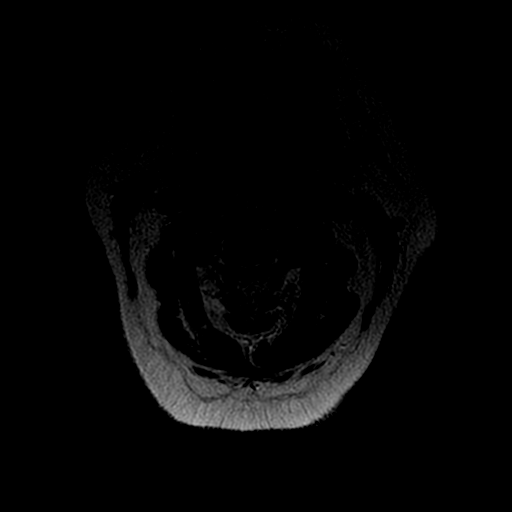

[19 of 48 positions shown; findings below may reference images not displayed]

FINDINGS: Motion artifact is present.

Alignment: Anteroposterior alignment is maintained.

Vertebrae: Vertebral body heights are preserved. There is no marrow
edema or suspicious osseous lesion.

Cord: No abnormal signal.

Posterior Fossa, vertebral arteries, paraspinal tissues:
Unremarkable.

Disc levels:

C2-C3:  No significant canal or foraminal stenosis.

C3-C4: Tiny central disc protrusion. Mild facet hypertrophy. No
significant canal or foraminal stenosis.

C4-C5: Minimal disc bulge. Mild facet hypertrophy. No significant
canal or foraminal stenosis.

C5-C6: Disc bulge with central disc protrusion, endplate
osteophytes, and facet and uncovertebral hypertrophy. Mild canal
stenosis. No significant foraminal stenosis.

C6-C7: Minimal disc bulge. No significant canal or foraminal
stenosis.

C7-T1:  No significant canal or foraminal stenosis.
IMPRESSION: No abnormal cord signal. Overall mild degenerative changes without
high-grade stenosis.

## 2019-10-07 MED ORDER — ROSUVASTATIN CALCIUM 20 MG PO TABS
40.0000 mg | ORAL_TABLET | Freq: Every day | ORAL | Status: DC
  Administered 2019-10-07: 40 mg via ORAL
  Filled 2019-10-07: qty 2

## 2019-10-07 MED ORDER — VITAMIN B-12 1000 MCG PO TABS
2500.0000 ug | ORAL_TABLET | Freq: Every day | ORAL | Status: DC
  Administered 2019-10-07 – 2019-10-08 (×2): 2500 ug via ORAL
  Filled 2019-10-07 (×2): qty 3

## 2019-10-07 MED ORDER — VITAMIN D 25 MCG (1000 UNIT) PO TABS
1000.0000 [IU] | ORAL_TABLET | Freq: Every day | ORAL | Status: DC
  Administered 2019-10-07 – 2019-10-08 (×2): 1000 [IU] via ORAL
  Filled 2019-10-07 (×2): qty 1

## 2019-10-07 MED ORDER — SERTRALINE HCL 50 MG PO TABS
50.0000 mg | ORAL_TABLET | Freq: Every day | ORAL | Status: DC
  Administered 2019-10-08: 50 mg via ORAL
  Filled 2019-10-07 (×2): qty 1

## 2019-10-07 MED ORDER — ASPIRIN EC 325 MG PO TBEC
325.0000 mg | DELAYED_RELEASE_TABLET | Freq: Every day | ORAL | Status: DC
  Administered 2019-10-08: 325 mg via ORAL
  Filled 2019-10-07: qty 1

## 2019-10-07 MED ORDER — MECLIZINE HCL 12.5 MG PO TABS
12.5000 mg | ORAL_TABLET | Freq: Three times a day (TID) | ORAL | Status: DC | PRN
  Administered 2019-10-07: 12.5 mg via ORAL
  Filled 2019-10-07 (×2): qty 1

## 2019-10-07 MED ORDER — PERFLUTREN LIPID MICROSPHERE
1.0000 mL | INTRAVENOUS | Status: AC | PRN
  Administered 2019-10-07: 2 mL via INTRAVENOUS
  Filled 2019-10-07: qty 10

## 2019-10-07 MED ORDER — ACETAMINOPHEN 325 MG PO TABS: 650.0000 mg | ORAL_TABLET | Freq: Three times a day (TID) | ORAL | Status: DC | PRN

## 2019-10-07 MED ORDER — METOPROLOL SUCCINATE ER 50 MG PO TB24
50.0000 mg | ORAL_TABLET | Freq: Every day | ORAL | Status: DC
  Administered 2019-10-08: 50 mg via ORAL
  Filled 2019-10-07: qty 1

## 2019-10-07 MED ORDER — EZETIMIBE 10 MG PO TABS
10.0000 mg | ORAL_TABLET | Freq: Every day | ORAL | Status: DC
  Administered 2019-10-07 – 2019-10-08 (×2): 10 mg via ORAL
  Filled 2019-10-07 (×2): qty 1

## 2019-10-07 MED ORDER — SENNOSIDES-DOCUSATE SODIUM 8.6-50 MG PO TABS: 1.0000 | ORAL_TABLET | Freq: Every evening | ORAL | Status: DC | PRN

## 2019-10-07 MED ORDER — CLOPIDOGREL BISULFATE 75 MG PO TABS
75.0000 mg | ORAL_TABLET | Freq: Every day | ORAL | Status: DC
  Administered 2019-10-07 – 2019-10-08 (×2): 75 mg via ORAL
  Filled 2019-10-07 (×2): qty 1

## 2019-10-07 MED ORDER — STROKE: EARLY STAGES OF RECOVERY BOOK
Freq: Once | Status: AC
  Filled 2019-10-07: qty 1

## 2019-10-07 MED ORDER — ENOXAPARIN SODIUM 40 MG/0.4ML ~~LOC~~ SOLN
40.0000 mg | SUBCUTANEOUS | Status: DC
  Administered 2019-10-07 – 2019-10-08 (×2): 40 mg via SUBCUTANEOUS
  Filled 2019-10-07 (×2): qty 0.4

## 2019-10-07 MED ORDER — PANTOPRAZOLE SODIUM 40 MG PO TBEC
40.0000 mg | DELAYED_RELEASE_TABLET | Freq: Every day | ORAL | Status: DC
  Administered 2019-10-07 – 2019-10-08 (×2): 40 mg via ORAL
  Filled 2019-10-07 (×2): qty 1

## 2019-10-07 MED ORDER — SODIUM CHLORIDE 0.9 % IV SOLN: INTRAVENOUS | Status: AC

## 2019-10-07 MED ORDER — IOHEXOL 300 MG/ML  SOLN
80.0000 mL | Freq: Once | INTRAMUSCULAR | Status: AC | PRN
  Administered 2019-10-07: 80 mL via INTRAVENOUS

## 2019-10-07 NOTE — ED Notes (Signed)
Ordered Hospital Bed 

## 2019-10-07 NOTE — ED Notes (Signed)
Taken to MRI 

## 2019-10-07 NOTE — Progress Notes (Signed)
  Echocardiogram 2D Echocardiogram has been performed.  April Lowery 10/07/2019, 3:43 PM

## 2019-10-07 NOTE — ED Provider Notes (Signed)
Received signout from previous providers, please see his note for complete H&P.  In short this is a 74 year old female currently awaits brain and cervical spine MRI for strokelike symptoms.  3 days ago she was evaluated for dizziness and left lower extremity numbness.  MRI of the brain at that time was unremarkable.  Last night she developed numbness and tingling sensation to her right upper and lower extremity as well as the right torso.  She takes Plavix, has pace maker, prior hx of PE/DVT.  Repeat brain and cervical spine MRI was obtained demonstrating 2 punctate acute infarcts of the dorsal left superior medulla and opposing inferior cerebellum with a small chronic left cerebellar infarct.  11:20 AM Appreciate consultation from on-call neurologist, Dr. Rory Percy who recommends obtaining head and neck CT angiogram.  Patient to continue with Plavix, and recommend hospital admission for further work-up and management of her acute stroke.  12:15 PM Appreciate consultation from [facility] Dr. Roosevelt Locks who agrees to see and admit pt for acute stroke.  Screening COVID-19 test ordered.    April Lowery was evaluated in Emergency Department on 10/07/2019 for the symptoms described in the history of present illness. She was evaluated in the context of the global COVID-19 pandemic, which necessitated consideration that the patient might be at risk for infection with the SARS-CoV-2 virus that causes COVID-19. Institutional protocols and algorithms that pertain to the evaluation of patients at risk for COVID-19 are in a state of rapid change based on information released by regulatory bodies including the CDC and federal and state organizations. These policies and algorithms were followed during the patient's care in the ED.  .Critical Care Performed by: Domenic Moras, PA-C Authorized by: Domenic Moras, PA-C   Critical care provider statement:    Critical care time (minutes):  31   Critical care was necessary to  treat or prevent imminent or life-threatening deterioration of the following conditions:  CNS failure or compromise   Critical care was time spent personally by me on the following activities:  Discussions with consultants, evaluation of patient's response to treatment, examination of patient, ordering and performing treatments and interventions, ordering and review of laboratory studies, ordering and review of radiographic studies, pulse oximetry, re-evaluation of patient's condition, obtaining history from patient or surrogate and review of old charts   I assumed direction of critical care for this patient from another provider in my specialty: yes      BP (!) 115/51   Pulse 60   Temp 98.1 F (36.7 C) (Oral)   Resp 14   SpO2 93%   Results for orders placed or performed during the hospital encounter of 10/06/19  Ethanol  Result Value Ref Range   Alcohol, Ethyl (B) <10 <10 mg/dL  Protime-INR  Result Value Ref Range   Prothrombin Time 13.0 11.4 - 15.2 seconds   INR 1.0 0.8 - 1.2  APTT  Result Value Ref Range   aPTT 32 24 - 36 seconds  CBC  Result Value Ref Range   WBC 7.4 4.0 - 10.5 K/uL   RBC 4.73 3.87 - 5.11 MIL/uL   Hemoglobin 13.5 12.0 - 15.0 g/dL   HCT 42.4 36 - 46 %   MCV 89.6 80.0 - 100.0 fL   MCH 28.5 26.0 - 34.0 pg   MCHC 31.8 30.0 - 36.0 g/dL   RDW 12.9 11.5 - 15.5 %   Platelets 219 150 - 400 K/uL   nRBC 0.0 0.0 - 0.2 %  Differential  Result Value Ref Range   Neutrophils Relative % 57 %   Neutro Abs 4.2 1.7 - 7.7 K/uL   Lymphocytes Relative 28 %   Lymphs Abs 2.1 0.7 - 4.0 K/uL   Monocytes Relative 8 %   Monocytes Absolute 0.6 0 - 1 K/uL   Eosinophils Relative 4 %   Eosinophils Absolute 0.3 0 - 0 K/uL   Basophils Relative 2 %   Basophils Absolute 0.1 0 - 0 K/uL   Immature Granulocytes 1 %   Abs Immature Granulocytes 0.04 0.00 - 0.07 K/uL  Comprehensive metabolic panel  Result Value Ref Range   Sodium 138 135 - 145 mmol/L   Potassium 3.9 3.5 - 5.1 mmol/L    Chloride 104 98 - 111 mmol/L   CO2 23 22 - 32 mmol/L   Glucose, Bld 132 (H) 70 - 99 mg/dL   BUN 11 8 - 23 mg/dL   Creatinine, Ser 0.74 0.44 - 1.00 mg/dL   Calcium 9.2 8.9 - 10.3 mg/dL   Total Protein 7.3 6.5 - 8.1 g/dL   Albumin 4.0 3.5 - 5.0 g/dL   AST 26 15 - 41 U/L   ALT 26 0 - 44 U/L   Alkaline Phosphatase 88 38 - 126 U/L   Total Bilirubin 0.4 0.3 - 1.2 mg/dL   GFR calc non Af Amer >60 >60 mL/min   GFR calc Af Amer >60 >60 mL/min   Anion gap 11 5 - 15  Urine rapid drug screen (hosp performed)  Result Value Ref Range   Opiates NONE DETECTED NONE DETECTED   Cocaine NONE DETECTED NONE DETECTED   Benzodiazepines NONE DETECTED NONE DETECTED   Amphetamines NONE DETECTED NONE DETECTED   Tetrahydrocannabinol NONE DETECTED NONE DETECTED   Barbiturates NONE DETECTED NONE DETECTED  Urinalysis, Routine w reflex microscopic  Result Value Ref Range   Color, Urine YELLOW YELLOW   APPearance CLEAR CLEAR   Specific Gravity, Urine 1.009 1.005 - 1.030   pH 5.0 5.0 - 8.0   Glucose, UA NEGATIVE NEGATIVE mg/dL   Hgb urine dipstick NEGATIVE NEGATIVE   Bilirubin Urine NEGATIVE NEGATIVE   Ketones, ur NEGATIVE NEGATIVE mg/dL   Protein, ur NEGATIVE NEGATIVE mg/dL   Nitrite NEGATIVE NEGATIVE   Leukocytes,Ua TRACE (A) NEGATIVE   WBC, UA 0-5 0 - 5 WBC/hpf   Bacteria, UA NONE SEEN NONE SEEN   Squamous Epithelial / LPF 0-5 0 - 5   Mucus PRESENT    CT HEAD WO CONTRAST  Result Date: 10/06/2019 CLINICAL DATA:  74 year old female with focal neurologic deficit. EXAM: CT HEAD WITHOUT CONTRAST TECHNIQUE: Contiguous axial images were obtained from the base of the skull through the vertex without intravenous contrast. COMPARISON:  Brain MRI dated 10/04/2019. FINDINGS: Brain: The ventricles and sulci appropriate size for patient's age. The gray-white matter discrimination is preserved. There is no acute intracranial hemorrhage. No mass effect or midline shift. No extra-axial fluid collection. Vascular: No  hyperdense vessel or unexpected calcification. Skull: Normal. Negative for fracture or focal lesion. Sinuses/Orbits: No acute finding. Other: None IMPRESSION: No acute intracranial pathology. Electronically Signed   By: Anner Crete M.D.   On: 10/06/2019 21:15   MR BRAIN WO CONTRAST  Result Date: 10/07/2019 CLINICAL DATA:  Numbness and tingling right upper and lower extremities EXAM: MRI HEAD WITHOUT CONTRAST TECHNIQUE: Multiplanar, multiecho pulse sequences of the brain and surrounding structures were obtained without intravenous contrast. COMPARISON:  10/04/2019 FINDINGS: Brain: There is a tiny focus of diffusion and T2 hyperintensity at  the left dorsal aspect of the superior medulla. Adjacent focus appears to be involving the opposing inferior cerebellum. Corresponding ADC intensity difficult evaluate due to small size but abnormal signal was not present on recent prior study. There is no evidence of intracranial hemorrhage. There is no intracranial mass, mass effect, or edema. There is no hydrocephalus or extra-axial fluid collection. Small chronic left cerebellar infarcts again noted. Vascular: Major vessel flow voids at the skull base are preserved. Skull and upper cervical spine: Normal marrow signal is preserved. Sinuses/Orbits: Paranasal sinuses are aerated. Orbits are unremarkable. Other: Sella is unremarkable.  Mastoid air cells are clear. IMPRESSION: Two punctate acute infarcts of the dorsal left superior medulla and opposing inferior cerebellum. Small chronic left cerebellar infarcts. Electronically Signed   By: Macy Mis M.D.   On: 10/07/2019 10:41   MR BRAIN WO CONTRAST  Result Date: 10/04/2019 CLINICAL DATA:  Dizziness EXAM: MRI HEAD WITHOUT CONTRAST TECHNIQUE: Multiplanar, multiecho pulse sequences of the brain and surrounding structures were obtained without intravenous contrast. COMPARISON:  None. FINDINGS: Brain: There is no acute infarction or intracranial hemorrhage. There is  no intracranial mass, mass effect, or edema. There is no hydrocephalus or extra-axial fluid collection. Ventricles and sulci are normal in size and configuration. Three small chronic left cerebellar infarcts. Vascular: Major vessel flow voids at the skull base are preserved. Skull and upper cervical spine: Normal marrow signal is preserved. Sinuses/Orbits: Mild mucosal thickening.  Orbits are unremarkable. Other: Sella is unremarkable.  Mastoid air cells are clear. IMPRESSION: No evidence of recent infarction, hemorrhage, or mass. Small chronic left cerebellar infarcts. Electronically Signed   By: Macy Mis M.D.   On: 10/04/2019 14:47   MR Cervical Spine Wo Contrast  Result Date: 10/07/2019 CLINICAL DATA:  Right extremity numbness and tingling EXAM: MRI CERVICAL SPINE WITHOUT CONTRAST TECHNIQUE: Multiplanar, multisequence MR imaging of the cervical spine was performed. No intravenous contrast was administered. COMPARISON:  None. FINDINGS: Motion artifact is present. Alignment: Anteroposterior alignment is maintained. Vertebrae: Vertebral body heights are preserved. There is no marrow edema or suspicious osseous lesion. Cord: No abnormal signal. Posterior Fossa, vertebral arteries, paraspinal tissues: Unremarkable. Disc levels: C2-C3:  No significant canal or foraminal stenosis. C3-C4: Tiny central disc protrusion. Mild facet hypertrophy. No significant canal or foraminal stenosis. C4-C5: Minimal disc bulge. Mild facet hypertrophy. No significant canal or foraminal stenosis. C5-C6: Disc bulge with central disc protrusion, endplate osteophytes, and facet and uncovertebral hypertrophy. Mild canal stenosis. No significant foraminal stenosis. C6-C7: Minimal disc bulge. No significant canal or foraminal stenosis. C7-T1:  No significant canal or foraminal stenosis. IMPRESSION: No abnormal cord signal. Overall mild degenerative changes without high-grade stenosis. Electronically Signed   By: Macy Mis M.D.    On: 10/07/2019 11:06   DG CHEST PORT 1 VIEW  Result Date: 10/04/2019 CLINICAL DATA:  Pacemaker EXAM: PORTABLE CHEST 1 VIEW COMPARISON:  None. FINDINGS: Left subclavian dual lead pacemaker in good position with leads in the right atrium and right ventricle. Postop CABG. Lungs are well aerated and clear. Negative for infiltrate effusion or edema. Negative for heart failure. Atherosclerotic aortic arch. Soft tissue calcification in the rotator cuff on the right compatible with calcific tendinitis. IMPRESSION: No active disease. Electronically Signed   By: Franchot Gallo M.D.   On: 10/04/2019 12:46      Domenic Moras, PA-C 10/07/19 1218    Ward, Delice Bison, DO 10/07/19 [phone]

## 2019-10-07 NOTE — Progress Notes (Signed)
Patient here for MRI. Orders received from [facility] cardiology. Surescan on. DOO at 70 BPM. Will return to original settings once scan is completed

## 2019-10-07 NOTE — ED Provider Notes (Signed)
[facility] EMERGENCY DEPARTMENT Provider Note   CSN: [id number] Arrival date & time: 10/06/19  1905     History Chief Complaint  Patient presents with  . Numbness    April Lowery is a 74 y.o. female.  Patient presents to the emergency department with a chief complaint of numbness and tingling in her right upper and right lower extremities.  She was seen 3 days ago for dizziness and had negative MRI and symptoms were attributed to possible vertigo.  She reports that her dizziness has since basically stopped, but then began having numbness with LKN at 10pm on 7/13.  She denies any pain.  Denies headache, neck pain, or back pain.  Denies any bowel or bladder problems.  She takes Plavix.  The history is provided by the patient. No language interpreter was used.       Past Medical History:  Diagnosis Date  . Acute ST segment elevation myocardial infarction ([place])   . Arthritis   . CAD (coronary artery disease) 02/15/2019  . Cancer of left kidney ([place])   . Complete atrioventricular block (H[facility])   . DVT (deep venous thrombosis) ([place]) 02/15/2019  . GI bleeding   . 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 HLD (hyperlipidemia) 02/15/2019  . HTN (hypertension) 02/15/2019  . Ischemic cardiomyopathy   . Melena   . Pacemaker   . Pulmonary thromboembolism ([place]) 02/15/2019    Patient Active Problem List   Diagnosis Date Noted  . B12 deficiency 09/29/2019  . Morbid obesity ([place]) 09/29/2019  . H/O renal cell carcinoma 03/17/2019  . Anxiety disorder, unspecified 02/24/2019  . Intermittent complete heart block ([place]) 02/16/2019  . Pacemaker 02/16/2019  . Mixed hyperlipidemia 02/15/2019  . CAD (coronary artery disease) 02/15/2019  . HTN (hypertension) 02/15/2019  . 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 Type 2 diabetes mellitus with other specified complication ([place]) 89/38/1017    Past Surgical History:  Procedure Laterality Date  . CHOLECYSTECTOMY    .  CORONARY ARTERY BYPASS GRAFT     STENTS  . PACEMAKER INSERTION    . TONSILLECTOMY AND ADENOIDECTOMY  2017     OB History   No obstetric history on file.     Family History  Problem Relation Age of Onset  . Heart disease Mother   . Heart attack Mother   . Heart disease Father   . Heart attack Father   . Heart disease Brother   . Heart attack Brother   . Heart attack Maternal Grandmother   . Heart attack Maternal Grandfather   . Heart attack Paternal Grandmother   . Heart attack Paternal Grandfather     Social History   Tobacco Use  . Smoking status: Former Research scientist (life sciences)  . Smokeless tobacco: Never Used  Vaping Use  . Vaping Use: Never used  Substance Use Topics  . Alcohol use: Never  . Drug use: Never    Home Medications Prior to Admission medications   Medication Sig Start Date End Date Taking? Authorizing Provider  acetaminophen (TYLENOL) 650 MG CR tablet Take 650 mg by mouth every 8 (eight) hours as needed for pain.    [provider]  cholecalciferol (VITAMIN D3) 25 MCG (1000 UT) tablet Take 1,000 Units by mouth daily.    [provider]  clopidogrel (PLAVIX) 75 MG tablet Take 1 tablet (75 mg total) by mouth daily. 02/16/19   Evans Lance, MD  Cyanocobalamin (B-12) 2500 MCG TABS Take 1 tablet by mouth daily.  [provider]  ezetimibe (ZETIA) 10 MG tablet Take 1 tablet (10 mg total) by mouth daily. 02/16/19   Evans Lance, MD  lisinopril (ZESTRIL) 5 MG tablet Take 5 mg by mouth daily.    [provider]  meclizine (ANTIVERT) 12.5 MG tablet Take 1 tablet (12.5 mg total) by mouth 3 (three) times daily as needed for dizziness. 10/04/19   Khatri, Hina, PA-C  metoprolol succinate (TOPROL-XL) 50 MG 24 hr tablet Take 1 tablet (50 mg total) by mouth daily. Take with or immediately following a meal. 02/16/19   Evans Lance, MD  omeprazole (PRILOSEC) 20 MG capsule Take 20 mg by mouth as needed (acid reflux).     [provider]   rosuvastatin (CRESTOR) 40 MG tablet Take 1 tablet (40 mg total) by mouth daily. Patient taking differently: Take 40 mg by mouth at bedtime.  02/16/19   Evans Lance, MD  sertraline (ZOLOFT) 50 MG tablet Take 1 tablet (50 mg total) by mouth daily. 10/04/19   [place], Betty G, MD    Allergies    Penicillins  Review of Systems   Review of Systems  All other systems reviewed and are negative.   Physical Exam Updated Vital Signs BP (!) 112/54 (BP Location: Right Arm)   Pulse 61   Temp 98.1 F (36.7 C) (Oral)   Resp 20   SpO2 98%   Physical Exam Vitals and nursing note reviewed.  Constitutional:      General: She is not in acute distress.    Appearance: She is well-developed.  HENT:     Head: Normocephalic and atraumatic.  Eyes:     Conjunctiva/sclera: Conjunctivae normal.  Cardiovascular:     Rate and Rhythm: Normal rate and regular rhythm.     Heart sounds: No murmur heard.   Pulmonary:     Effort: Pulmonary effort is normal. No respiratory distress.     Breath sounds: Normal breath sounds.  Abdominal:     Palpations: Abdomen is soft.     Tenderness: There is no abdominal tenderness.  Musculoskeletal:     Cervical back: Neck supple.  Skin:    General: Skin is warm and dry.  Neurological:     Mental Status: She is alert.     Comments: 3+ lower extremity reflexes bilaterally Decreased light touch sensation in RUE and RLE compared to LUE and LLE Strength of bilateral lower extremities 5/5  Normal finger to nose No pronator drift CN 3-12 intact Speech is clear  Psychiatric:        Mood and Affect: Mood normal.        Behavior: Behavior normal.     ED Results / Procedures / Treatments   Labs (all labs ordered are listed, but only abnormal results are displayed) Labs Reviewed  COMPREHENSIVE METABOLIC PANEL - Abnormal; Notable for the following components:      Result Value   Glucose, Bld 132 (*)    All other components within normal limits  URINALYSIS,  ROUTINE W REFLEX MICROSCOPIC - Abnormal; Notable for the following components:   Leukocytes,Ua TRACE (*)    All other components within normal limits  ETHANOL  PROTIME-INR  APTT  CBC  DIFFERENTIAL  RAPID URINE DRUG SCREEN, HOSP PERFORMED  I-STAT CHEM 8, ED    EKG EKG Interpretation  Date/Time:  Wednesday October 06 2019 19:16:12 EDT Ventricular Rate:  63 PR Interval:  194 QRS Duration: 90 QT Interval:  376 QTC Calculation: 384 R Axis:  52 Text Interpretation: Normal sinus rhythm Inferior infarct , age undetermined Anterolateral infarct , age undetermined Abnormal ECG No significant change since last tracing Confirmed by Pryor Curia [phone]) on 10/07/2019 2:41:29 AM   Radiology CT HEAD WO CONTRAST  Result Date: 10/06/2019 CLINICAL DATA:  74 year old female with focal neurologic deficit. EXAM: CT HEAD WITHOUT CONTRAST TECHNIQUE: Contiguous axial images were obtained from the base of the skull through the vertex without intravenous contrast. COMPARISON:  Brain MRI dated 10/04/2019. FINDINGS: Brain: The ventricles and sulci appropriate size for patient's age. The gray-white matter discrimination is preserved. There is no acute intracranial hemorrhage. No mass effect or midline shift. No extra-axial fluid collection. Vascular: No hyperdense vessel or unexpected calcification. Skull: Normal. Negative for fracture or focal lesion. Sinuses/Orbits: No acute finding. Other: None IMPRESSION: No acute intracranial pathology. Electronically Signed   By: Anner Crete M.D.   On: 10/06/2019 21:15    Procedures Procedures (including critical care time)  Medications Ordered in ED Medications - No data to display  ED Course  I have reviewed the triage vital signs and the nursing notes.  Pertinent labs & imaging results that were available during my care of the patient were reviewed by me and considered in my medical decision making (see chart for details).    MDM Rules/Calculators/A&P                           Patient here with numbness in RUE and RLE.  No weakness.  No back pain or neck pain.  MRI brain for dizziness was normal 3 days ago.  No slurred speech or vision problems.  Labs and CT head are reassuring.  Patient seen by and discussed with Dr. Leonides Schanz, who agrees with plan for MRI brain and c-spine.  Patient signed out to oncoming team at shift change.  Plan: If MRIs are negative, DC with outpatient neuro f/u.   Final Clinical Impression(s) / ED Diagnoses Final diagnoses:  Numbness    Rx / DC Orders ED Discharge Orders    None       Montine Circle, PA-C 10/07/19 [place], Delice Bison, DO 10/07/19 [phone]

## 2019-10-07 NOTE — Consult Note (Addendum)
Neurology Consultation  Reason for Consult: Stroke Referring Physician: Montine Circle, PA-C  CC: Stroke/decreased strength right leg/unstable gait  History is obtained from: Patient  HPI: April Lowery is a 74 y.o. female with history of pulmonary embolism, hypertension, hyperlipidemia, DVT, CAD.  Patient recently was at the hospital this past Monday secondary to dizziness and blurred vision along with trouble ambulating.  MRI was obtained and did not show any abnormalities and/or strokes.  During that visit she was diagnosed with dizziness and vertigo and discharged with meclizine.  Patient states that the meclizine did help at that time.  In talking further with the patient she states that the day that she came in to the hospital on Monday she was also having difficulty with her left arm, when using her left arm to reach out to grab something it was as if she could not control it.  She felt as though he had good strength but as stated cannot control her hand.  She also stated her left leg had paresthesias.  Patient returned today  The ED secondary to having difficulty with ambulation as her right leg had decreased sensation and felt as though it could not support her.  She also notes that she has decreased sensation in her right arm and right leg.  While in the ED patient did obtain MRI of cervical spine which did not show any abnormal cord signal with overall mild degenerative changes and no high-grade stenosis.  However, her MRI of her brain did reveal 2 punctate acute infarcts of the dorsal left superior medulla and opposing anterior cerebellum.   LKW: 10/03/2019 2200 hrs. tpa given?: no, out of window Premorbid modified Rankin scale (mRS): 0 NIH stroke score: 3-1 for sensory, and 2 for limb ataxia.   Past Medical History:  Diagnosis Date  . Acute ST segment elevation myocardial infarction ([place])   . Arthritis   . CAD (coronary artery disease) 02/15/2019  . Cancer of left kidney ([place])    . Complete atrioventricular block (H[facility])   . DVT (deep venous thrombosis) ([place]) 02/15/2019  . GI bleeding   . 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 HLD (hyperlipidemia) 02/15/2019  . HTN (hypertension) 02/15/2019  . Ischemic cardiomyopathy   . Melena   . Pacemaker   . Pulmonary thromboembolism ([place]) 02/15/2019     Family History  Problem Relation Age of Onset  . Heart disease Mother   . Heart attack Mother   . Heart disease Father   . Heart attack Father   . Heart disease Brother   . Heart attack Brother   . Heart attack Maternal Grandmother   . Heart attack Maternal Grandfather   . Heart attack Paternal Grandmother   . Heart attack Paternal Grandfather    Social History:   reports that she has quit smoking. She has never used smokeless tobacco. She reports that she does not drink alcohol and does not use drugs.  Medications No current facility-administered medications for this encounter.  Current Outpatient Medications:  .  acetaminophen (TYLENOL) 650 MG CR tablet, Take 650 mg by mouth every 8 (eight) hours as needed for pain., Disp: , Rfl:  .  cholecalciferol (VITAMIN D3) 25 MCG (1000 UT) tablet, Take 1,000 Units by mouth daily., Disp: , Rfl:  .  clopidogrel (PLAVIX) 75 MG tablet, Take 1 tablet (75 mg total) by mouth daily., Disp: 30 tablet, Rfl: 11 .  Cyanocobalamin (B-12) 2500 MCG TABS, Take 1 tablet by mouth daily., Disp: , Rfl:  .  ezetimibe (ZETIA) 10 MG tablet, Take 1 tablet (10 mg total) by mouth daily., Disp: 30 tablet, Rfl: 11 .  lisinopril (ZESTRIL) 5 MG tablet, Take 5 mg by mouth daily., Disp: , Rfl:  .  meclizine (ANTIVERT) 12.5 MG tablet, Take 1 tablet (12.5 mg total) by mouth 3 (three) times daily as needed for dizziness., Disp: 10 tablet, Rfl: 0 .  metoprolol succinate (TOPROL-XL) 50 MG 24 hr tablet, Take 1 tablet (50 mg total) by mouth daily. Take with or immediately following a meal., Disp: 30 tablet, Rfl: 11 .  omeprazole (PRILOSEC) 20 MG capsule,  Take 20 mg by mouth as needed (acid reflux). , Disp: , Rfl:  .  rosuvastatin (CRESTOR) 40 MG tablet, Take 1 tablet (40 mg total) by mouth daily. (Patient taking differently: Take 40 mg by mouth at bedtime. ), Disp: 30 tablet, Rfl: 11 .  sertraline (ZOLOFT) 50 MG tablet, Take 1 tablet (50 mg total) by mouth daily., Disp: 90 tablet, Rfl: 1  ROS:     General ROS: negative for - chills, fatigue, fever, night sweats, weight gain or weight loss Psychological ROS: negative for - behavioral disorder, hallucinations, memory difficulties, mood swings or suicidal ideation Ophthalmic ROS: negative for - blurry vision, double vision, eye pain or loss of vision ENT ROS: negative for - epistaxis, nasal discharge, oral lesions, sore throat, tinnitus or vertigo Allergy and Immunology ROS: negative for - hives or itchy/watery eyes Hematological and Lymphatic ROS: negative for - bleeding problems, bruising or swollen lymph nodes Endocrine ROS: negative for - galactorrhea, hair pattern changes, polydipsia/polyuria or temperature intolerance Respiratory ROS: negative for - cough, hemoptysis, shortness of breath or wheezing Cardiovascular ROS: negative for - chest pain, dyspnea on exertion, edema or irregular heartbeat Gastrointestinal ROS: negative for - abdominal pain, diarrhea, hematemesis, nausea/vomiting or stool incontinence Genito-Urinary ROS: negative for - dysuria, hematuria, incontinence or urinary frequency/urgency Musculoskeletal ROS: Positive for -muscular weakness Neurological ROS: as noted in HPI Dermatological ROS: negative for rash and skin lesion changes  Exam: Current vital signs: BP (!) 115/51   Pulse 60   Temp 98.1 F (36.7 C) (Oral)   Resp 14   SpO2 93%  Vital signs in last 24 hours: Temp:  [98.1 F (36.7 C)-98.4 F (36.9 C)] 98.1 F (36.7 C) (07/15 0124) Pulse Rate:  [58-61] 60 (07/15 0730) Resp:  [13-20] 14 (07/15 0730) BP: (108-135)/(51-68) 115/51 (07/15 0730) SpO2:  [93 %-98  %] 93 % (07/15 0730)   Constitutional: Appears well-developed and well-nourished.  Psych: Affect appropriate to situation Eyes: No scleral injection HENT: No OP obstrucion Head: Normocephalic.  Cardiovascular: Normal rate and regular rhythm.  Respiratory: Effort normal, non-labored breathing GI: Soft.  No distension. There is no tenderness.  Skin: WDI  Neuro: Mental Status: Patient is awake, alert, oriented to person, place, month, year, and situation.  No aphasia or dysarthria.  Speech is intact for naming, repeating and comprehension.  Patient is able to follow commands and gives a coherent history. Cranial Nerves: II: Visual Fields are full.  III,IV, VI: EOMI without ptosis or diploplia. Pupils equal, round and reactive to light V: Facial sensation is symmetric to temperature VII: Facial movement is symmetric.  VIII: hearing is intact to voice X: Palat elevates symmetrically XI: Shoulder shrug is symmetric. XII: tongue is midline without atrophy or fasciculations.  Motor: Tone is normal. Bulk is normal. 5/5 strength was present in all four extremities.  No drift Sensory: Decreased sensation on the right arm and right leg  Deep Tendon Reflexes: 2+ and symmetric in the biceps and patellae.  Plantars: Tonic upgoing toe on the left and downgoing toe on the right Cerebellar: Left leg heel to shin showed ataxia, left finger nose finger showed ataxia  Labs I have reviewed labs in epic and the results pertinent to this consultation are:   CBC    Component Value Date/Time   WBC 7.4 10/06/2019 1935   RBC 4.73 10/06/2019 1935   HGB 13.5 10/06/2019 1935   HCT 42.4 10/06/2019 1935   PLT 219 10/06/2019 1935   MCV 89.6 10/06/2019 1935   MCH 28.5 10/06/2019 1935   MCHC 31.8 10/06/2019 1935   RDW 12.9 10/06/2019 1935   LYMPHSABS 2.1 10/06/2019 1935   MONOABS 0.6 10/06/2019 1935   EOSABS 0.3 10/06/2019 1935   BASOSABS 0.1 10/06/2019 1935    CMP     Component Value  Date/Time   NA 138 10/06/2019 1935   K 3.9 10/06/2019 1935   CL 104 10/06/2019 1935   CO2 23 10/06/2019 1935   GLUCOSE 132 (H) 10/06/2019 1935   BUN 11 10/06/2019 1935   CREATININE 0.74 10/06/2019 1935   CALCIUM 9.2 10/06/2019 1935   PROT 7.3 10/06/2019 1935   ALBUMIN 4.0 10/06/2019 1935   AST 26 10/06/2019 1935   ALT 26 10/06/2019 1935   ALKPHOS 88 10/06/2019 1935   BILITOT 0.4 10/06/2019 1935   GFRNONAA >60 10/06/2019 1935   GFRAA >60 10/06/2019 1935    Lipid Panel  No results found for: CHOL, TRIG, HDL, CHOLHDL, VLDL, LDLCALC, LDLDIRECT   Imaging I have reviewed the images obtained:  MRI examination of the brain-reveal 2 punctate acute infarcts of the dorsal left superior medulla and opposing anterior cerebellum.  CT head-no acute intracranial pathology  Etta Quill PA-C [facility] [phone]  M-F  (9:00 am- 5:00 PM)  10/07/2019, 12:09 PM     Assessment:  This is a 73 year old female who had onset of symptoms Monday morning.  These included gait instability and blurred vision.  At that time MRI did not show any acute intracranial abnormalities.  At the time she was diagnosed with vertigo and given meclizine.  Patient returns today again due to gait instability however this time she had decreased sensation on her right leg and right arm.  Today MRI did reveal to punctate acute infarcts of the dorsal left superior medulla and opposing anterior cerebellum.  Exam revealed left heel-to-shin and left finger-nose-finger ataxia along with decreased sensation on the right arm and leg.  Impression: -Stroke -Likely etiology is artery to artery  Recommend -CTA head neck -Transthoracic Echo -Continue patient on home Plavix  -Start or continue Atorvastatin 80 mg/other high intensity statin -BP goal: Given she is multiple days out blood pressure would be normotensive with systolic <224 -HBAIC and Lipid profile -Telemetry monitoring -Frequent neuro checks -NPO  until passes stroke swallow screen -PT/OT # please page stroke NP  Or  PA  Or MD from 8am -4 pm  as this patient from this time will be  followed by the stroke.   You can look them up on [email or web address]  Password [email or web address]

## 2019-10-07 NOTE — Progress Notes (Signed)
   April Lowery has been requested to perform a transesophageal echocardiogram on April Lowery for stroke.  After careful review of history and examination, the risks and benefits of transesophageal echocardiogram have been explained including risks of esophageal damage, perforation (1:10,000 risk), bleeding, pharyngeal hematoma as well as other potential complications associated with conscious sedation including aspiration, arrhythmia, respiratory failure and death. Alternatives to treatment were discussed, questions were answered. Patient is willing to proceed.   Procedure scheduled for 10/08/2019 at 12:30pm with Dr. Debara Pickett.  Darreld Mclean, PA-C 10/07/2019 4:50 PM

## 2019-10-07 NOTE — Progress Notes (Addendum)
Received call from Dr. Rory Percy. He saw cardiology APP Callie's TEE consent on this patient earlier. (We typically receive these requests via our cardmaster.) From his opinion he does not think her stroke looks embolic. He has recommended a surface echo but does not think she needs TEE at this time. Endoscopy is closed for the evening so I sent a message to our early morning APP/team to cancel this. He also reports the notes indicate that IM spoke with someone in cardiology today to request a pacemaker interrogation. April Lowery was working on an interrogation earlier today so have sent her a message to please help [place] get this information to the stroke team tomorrow. I cancelled out TEE orders but will otherwise defer plan for diet resumption to primary teams. Prarthana Parlin PA-C

## 2019-10-07 NOTE — H&P (Signed)
History and Physical    April Lowery:[id number] DOB: Oct 16, 1945 DOA: 10/06/2019  PCP: Lowery, April G, MD (Confirm with patient/family/NH records and if not entered, this has to be entered at [facility] point of entry) Patient coming from: Home  I have personally briefly reviewed patient's old medical records in [place]  Chief Complaint: Numbness on the right side  HPI: April Lowery is a 74 y.o. female with medical history significant of CAD with multiple stents, remote history of DVT and PE, ischemic cardiomyopathy status post ICD and PPM, H TN, HLD, presented with dizziness blurred vision and right-sided weakness numbness.  Symptoms started on Monday, initially with sudden onset of lightheadedness and blurry visions.  Came to the ED, MRI showed negative for acute stroke, patient was discharged with as needed meclizine.  She has been taking meclizine 2 times a day for the last 3 days and her lightheaded symptoms significantly improved.   Yesterday morning around 7:00 in the morning, patient started to feel right-sided numbness, symptoms persisted through this morning.  Patient denied any weakness of any of her limbs, no headache no blurry vision no hearing changes.  ED Course: MRI showed two punctate acute infarcts of the dorsal left superior medulla and opposing inferior cerebellum. Small chronic left cerebellar infarcts.  Review of Systems: As per HPI otherwise 10 point review of systems negative.    Past Medical History:  Diagnosis Date  . Acute ST segment elevation myocardial infarction ([place])   . Arthritis   . CAD (coronary artery disease) 02/15/2019  . Cancer of left kidney ([place])   . Complete atrioventricular block (H[facility])   . DVT (deep venous thrombosis) ([place]) 02/15/2019  . GI bleeding   . 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 HLD (hyperlipidemia) 02/15/2019  . HTN (hypertension) 02/15/2019  . Ischemic cardiomyopathy   . Melena   . Pacemaker   . Pulmonary  thromboembolism ([place]) 02/15/2019    Past Surgical History:  Procedure Laterality Date  . CHOLECYSTECTOMY    . CORONARY ARTERY BYPASS GRAFT     STENTS  . PACEMAKER INSERTION    . TONSILLECTOMY AND ADENOIDECTOMY  2017     reports that she has quit smoking. She has never used smokeless tobacco. She reports that she does not drink alcohol and does not use drugs.  Allergies  Allergen Reactions  . Penicillins Rash    Family History  Problem Relation Age of Onset  . Heart disease Mother   . Heart attack Mother   . Heart disease Father   . Heart attack Father   . Heart disease Brother   . Heart attack Brother   . Heart attack Maternal Grandmother   . Heart attack Maternal Grandfather   . Heart attack Paternal Grandmother   . Heart attack Paternal Grandfather     Prior to Admission medications   Medication Sig Start Date End Date Taking? Authorizing Provider  acetaminophen (TYLENOL) 650 MG CR tablet Take 650 mg by mouth every 8 (eight) hours as needed for pain.    [provider]  cholecalciferol (VITAMIN D3) 25 MCG (1000 UT) tablet Take 1,000 Units by mouth daily.    [provider]  clopidogrel (PLAVIX) 75 MG tablet Take 1 tablet (75 mg total) by mouth daily. 02/16/19   Evans Lance, MD  Cyanocobalamin (B-12) 2500 MCG TABS Take 1 tablet by mouth daily.    [provider]  ezetimibe (ZETIA) 10 MG tablet Take 1 tablet (10 mg  total) by mouth daily. 02/16/19   Evans Lance, MD  lisinopril (ZESTRIL) 5 MG tablet Take 5 mg by mouth daily.    [provider]  meclizine (ANTIVERT) 12.5 MG tablet Take 1 tablet (12.5 mg total) by mouth 3 (three) times daily as needed for dizziness. 10/04/19   Khatri, Hina, PA-C  metoprolol succinate (TOPROL-XL) 50 MG 24 hr tablet Take 1 tablet (50 mg total) by mouth daily. Take with or immediately following a meal. 02/16/19   Evans Lance, MD  omeprazole (PRILOSEC) 20 MG capsule Take 20 mg by mouth as needed (acid  reflux).     [provider]  rosuvastatin (CRESTOR) 40 MG tablet Take 1 tablet (40 mg total) by mouth daily. Patient taking differently: Take 40 mg by mouth at bedtime.  02/16/19   Evans Lance, MD  sertraline (ZOLOFT) 50 MG tablet Take 1 tablet (50 mg total) by mouth daily. 10/04/19   Lowery, April G, MD    Physical Exam: Vitals:   10/07/19 0600 10/07/19 0730 10/07/19 1244 10/07/19 1306  BP: (!) 135/53 (!) 115/51 (!) 112/58   Pulse: (!) 58 60 61   Resp: 13 14 16    Temp:      TempSrc:      SpO2: 97% 93% 96% 96%    Constitutional: NAD, calm, comfortable Vitals:   10/07/19 0600 10/07/19 0730 10/07/19 1244 10/07/19 1306  BP: (!) 135/53 (!) 115/51 (!) 112/58   Pulse: (!) 58 60 61   Resp: 13 14 16    Temp:      TempSrc:      SpO2: 97% 93% 96% 96%   Eyes: PERRL, lids and conjunctivae normal ENMT: Mucous membranes are moist. Posterior pharynx clear of any exudate or lesions.Normal dentition.  Neck: normal, supple, no masses, no thyromegaly Respiratory: clear to auscultation bilaterally, no wheezing, no crackles. Normal respiratory effort. No accessory muscle use.  Cardiovascular: Regular rate and rhythm, no murmurs / rubs / gallops. No extremity edema. 2+ pedal pulses. No carotid bruits.  Abdomen: no tenderness, no masses palpated. No hepatosplenomegaly. Bowel sounds positive.  Musculoskeletal: no clubbing / cyanosis. No joint deformity upper and lower extremities. Good ROM, no contractures. Normal muscle tone.  Skin: no rashes, lesions, ulcers. No induration Neurologic: CN 2-12 grossly intact. Sensation intact, DTR normal. Strength 5/5 in all 4.  Decreased light touch sensation of the right arm and leg compared to the left side. Psychiatric: Normal judgment and insight. Alert and oriented x 3. Normal mood.     Labs on Admission: I have personally reviewed following labs and imaging studies  CBC: Recent Labs  Lab 10/04/19 0928 10/06/19 1935  WBC 6.1 7.4  NEUTROABS  4.1 4.2  HGB 13.3 13.5  HCT 41.3 42.4  MCV 88.6 89.6  PLT 185 782   Basic Metabolic Panel: Recent Labs  Lab 10/04/19 0928 10/06/19 1935  NA 139 138  K 4.0 3.9  CL 107 104  CO2 24 23  GLUCOSE 142* 132*  BUN 8 11  CREATININE 0.82 0.74  CALCIUM 9.0 9.2   GFR: Estimated Creatinine Clearance: 67.2 mL/min (by C-G formula based on SCr of 0.74 mg/dL). Liver Function Tests: Recent Labs  Lab 10/04/19 0928 10/06/19 1935  AST 27 26  ALT 28 26  ALKPHOS 80 88  BILITOT 0.9 0.4  PROT 7.0 7.3  ALBUMIN 3.7 4.0   No results for input(s): LIPASE, AMYLASE in the last 168 hours. No results for input(s): AMMONIA in the last 168  hours. Coagulation Profile: Recent Labs  Lab 10/04/19 0928 10/06/19 1935  INR 1.0 1.0   Cardiac Enzymes: No results for input(s): CKTOTAL, CKMB, CKMBINDEX, TROPONINI in the last 168 hours. BNP (last 3 results) No results for input(s): PROBNP in the last 8760 hours. HbA1C: No results for input(s): HGBA1C in the last 72 hours. CBG: No results for input(s): GLUCAP in the last 168 hours. Lipid Profile: No results for input(s): CHOL, HDL, LDLCALC, TRIG, CHOLHDL, LDLDIRECT in the last 72 hours. Thyroid Function Tests: No results for input(s): TSH, T4TOTAL, FREET4, T3FREE, THYROIDAB in the last 72 hours. Anemia Panel: No results for input(s): VITAMINB12, FOLATE, FERRITIN, TIBC, IRON, RETICCTPCT in the last 72 hours. Urine analysis:    Component Value Date/Time   COLORURINE YELLOW 10/06/2019 2131   APPEARANCEUR CLEAR 10/06/2019 2131   LABSPEC 1.009 10/06/2019 2131   PHURINE 5.0 10/06/2019 2131   [place] NEGATIVE 10/06/2019 2131   HGBUR NEGATIVE 10/06/2019 2131   [place] NEGATIVE 10/06/2019 2131   [place] NEGATIVE 10/06/2019 2131   PROTEINUR NEGATIVE 10/06/2019 2131   NITRITE NEGATIVE 10/06/2019 2131   LEUKOCYTESUR TRACE (A) 10/06/2019 2131    Radiological Exams on Admission: CT HEAD WO CONTRAST  Result Date: 10/06/2019 CLINICAL DATA:   74 year old female with focal neurologic deficit. EXAM: CT HEAD WITHOUT CONTRAST TECHNIQUE: Contiguous axial images were obtained from the base of the skull through the vertex without intravenous contrast. COMPARISON:  Brain MRI dated 10/04/2019. FINDINGS: Brain: The ventricles and sulci appropriate size for patient's age. The gray-white matter discrimination is preserved. There is no acute intracranial hemorrhage. No mass effect or midline shift. No extra-axial fluid collection. Vascular: No hyperdense vessel or unexpected calcification. Skull: Normal. Negative for fracture or focal lesion. Sinuses/Orbits: No acute finding. Other: None IMPRESSION: No acute intracranial pathology. Electronically Signed   By: Anner Crete M.D.   On: 10/06/2019 21:15   MR BRAIN WO CONTRAST  Result Date: 10/07/2019 CLINICAL DATA:  Numbness and tingling right upper and lower extremities EXAM: MRI HEAD WITHOUT CONTRAST TECHNIQUE: Multiplanar, multiecho pulse sequences of the brain and surrounding structures were obtained without intravenous contrast. COMPARISON:  10/04/2019 FINDINGS: Brain: There is a tiny focus of diffusion and T2 hyperintensity at the left dorsal aspect of the superior medulla. Adjacent focus appears to be involving the opposing inferior cerebellum. Corresponding ADC intensity difficult evaluate due to small size but abnormal signal was not present on recent prior study. There is no evidence of intracranial hemorrhage. There is no intracranial mass, mass effect, or edema. There is no hydrocephalus or extra-axial fluid collection. Small chronic left cerebellar infarcts again noted. Vascular: Major vessel flow voids at the skull base are preserved. Skull and upper cervical spine: Normal marrow signal is preserved. Sinuses/Orbits: Paranasal sinuses are aerated. Orbits are unremarkable. Other: Sella is unremarkable.  Mastoid air cells are clear. IMPRESSION: Two punctate acute infarcts of the dorsal left superior  medulla and opposing inferior cerebellum. Small chronic left cerebellar infarcts. Electronically Signed   By: Macy Mis M.D.   On: 10/07/2019 10:41   MR Cervical Spine Wo Contrast  Result Date: 10/07/2019 CLINICAL DATA:  Right extremity numbness and tingling EXAM: MRI CERVICAL SPINE WITHOUT CONTRAST TECHNIQUE: Multiplanar, multisequence MR imaging of the cervical spine was performed. No intravenous contrast was administered. COMPARISON:  None. FINDINGS: Motion artifact is present. Alignment: Anteroposterior alignment is maintained. Vertebrae: Vertebral body heights are preserved. There is no marrow edema or suspicious osseous lesion. Cord: No abnormal signal. Posterior Fossa, vertebral arteries, paraspinal tissues:  Unremarkable. Disc levels: C2-C3:  No significant canal or foraminal stenosis. C3-C4: Tiny central disc protrusion. Mild facet hypertrophy. No significant canal or foraminal stenosis. C4-C5: Minimal disc bulge. Mild facet hypertrophy. No significant canal or foraminal stenosis. C5-C6: Disc bulge with central disc protrusion, endplate osteophytes, and facet and uncovertebral hypertrophy. Mild canal stenosis. No significant foraminal stenosis. C6-C7: Minimal disc bulge. No significant canal or foraminal stenosis. C7-T1:  No significant canal or foraminal stenosis. IMPRESSION: No abnormal cord signal. Overall mild degenerative changes without high-grade stenosis. Electronically Signed   By: Macy Mis M.D.   On: 10/07/2019 11:06    EKG: Pending  Assessment/Plan Active Problems:   CVA (cerebral vascular accident) ([place])  (please populate well all problems here in Problem List. (For example, if patient is on BP meds at home and you resume or decide to hold them, it is a problem that needs to be her. Same for CAD, COPD, HLD and so on)  Right-sided paresthesia -R/O embolic stroke. Pt on duo-antiPLT and statin. Since patient has a pacemaker, and symptoms looks like she had a prelude TIAs in  terms of vertigo/lightheadedness and coordination issue 3 days ago, and today's MRI showed stroke on multiple locus risk concern about embolic etiology.  Discussed with cardiology, who will investigate the pacemaker. -Neurology ordered CT angiogram, will follow. -Continue aspirin Plavix and high intensity statin -A1c  Vertigo likely from acute cerebral infarction -Improving, PT evaluation  HTN -Hold BP meds for 2 to 3 days to allow permissive hypertension  HLD -Continue Crestor  History of CAD and ischemic cardiomyopathy -Euvolemic, no chest pain, EKG pending.  Echo ordered  DVT prophylaxis: Lovenox Code Status: Full code Family Communication: Son at bedside Disposition Plan: Please need 1 to 2 days hospital stay for stroke work-up and PT evaluation. Consults called: Cardiology and neurology Admission status: Telemetry admission   Lequita Halt MD [facility] Pager [phone]  10/07/2019, 1:11 PM

## 2019-10-07 NOTE — ED Notes (Signed)
Pt transferred to hospital bed for comfort.

## 2019-10-07 NOTE — ED Notes (Signed)
RN notified Rob, PA that pt has a pacemaker.

## 2019-10-08 ENCOUNTER — Ambulatory Visit: Payer: Medicare Other | Admitting: Family Medicine

## 2019-10-08 ENCOUNTER — Encounter (HOSPITAL_COMMUNITY): Payer: Self-pay | Admitting: Certified Registered Nurse Anesthetist

## 2019-10-08 ENCOUNTER — Encounter (HOSPITAL_COMMUNITY): Payer: Self-pay | Admitting: Internal Medicine

## 2019-10-08 ENCOUNTER — Other Ambulatory Visit (HOSPITAL_COMMUNITY): Payer: Medicare Other

## 2019-10-08 ENCOUNTER — Encounter (HOSPITAL_COMMUNITY)
Admission: EM | Disposition: A | Discharge status: Home / Self Care | Payer: Self-pay | Source: Home / Self Care | Attending: Internal Medicine

## 2019-10-08 ENCOUNTER — Other Ambulatory Visit: Payer: Self-pay | Admitting: Neurology

## 2019-10-08 DIAGNOSIS — Z950 Presence of cardiac pacemaker: Secondary | ICD-10-CM

## 2019-10-08 DIAGNOSIS — I639 Cerebral infarction, unspecified: Secondary | ICD-10-CM

## 2019-10-08 DIAGNOSIS — Q998 Other specified chromosome abnormalities: Secondary | ICD-10-CM

## 2019-10-08 DIAGNOSIS — I6302 Cerebral infarction due to thrombosis of basilar artery: Secondary | ICD-10-CM

## 2019-10-08 DIAGNOSIS — I10 Essential (primary) hypertension: Secondary | ICD-10-CM

## 2019-10-08 DIAGNOSIS — I255 Ischemic cardiomyopathy: Secondary | ICD-10-CM

## 2019-10-08 LAB — LIPID PANEL
Cholesterol: 143 mg/dL (ref 0–200)
HDL: 35 mg/dL — ABNORMAL LOW (ref >40)
LDL Cholesterol: 71 mg/dL (ref 0–99)
Total CHOL/HDL Ratio: 4.1 RATIO
Triglycerides: 183 mg/dL — ABNORMAL HIGH (ref <150)
VLDL: 37 mg/dL (ref 0–40)

## 2019-10-08 LAB — HEMOGLOBIN A1C
Hgb A1c MFr Bld: 7 % — ABNORMAL HIGH (ref 4.8–5.6)
Mean Plasma Glucose: 154.2 mg/dL

## 2019-10-08 SURGERY — CANCELLED PROCEDURE

## 2019-10-08 MED ORDER — ASPIRIN 325 MG PO TBEC: 325.0000 mg | DELAYED_RELEASE_TABLET | Freq: Every day | ORAL | 0 refills | Status: DC

## 2019-10-08 NOTE — Evaluation (Signed)
Occupational Therapy Evaluation and Discharge from OT Patient Details Name: April Lowery MRN: [id number] DOB: 05-18-1945 Today's Date: 10/08/2019    History of Present Illness April Lowery is a 74 y.o. female with history of pulmonary embolism, hypertension, hyperlipidemia, DVT, CAD. Presented with dizziness, blurred vision, trouble ambulating (right leg sensation changes) and left arm troubles. MRI revealed 2 punctate acute infarcts of the dorsal left superior medulla and opposing anterior cerebellum.   Clinical Impression   PTA Pt independent in ADL and functional transfers. Enjoys hiking. Been here almost a year from [place] - living with son and his family but has her own "apt" in basement including cooking etc. Today Pt is mod I in functional ADL for UB and LB. Visual deficits resolved. Sensation deficits in RUE and RLE remain. Educated on safety concerns with sensation deficits. Pt and son verbalized understanding. Educated on "BE FAST" signs and symptoms of stroke, and answered all questions and concerns within scope of practice. Education complete. OT to sign off at this time.     Follow Up Recommendations  No OT follow up;Supervision - Intermittent    Equipment Recommendations  None recommended by OT    Recommendations for Other Services       Precautions / Restrictions Precautions Precautions: Fall Restrictions Weight Bearing Restrictions: No      Mobility Bed Mobility Overal bed mobility: Independent                Transfers Overall transfer level: Needs assistance Equipment used: None Transfers: Sit to/from Stand Sit to Stand: Supervision         General transfer comment: for safety    Balance Overall balance assessment: Needs assistance Sitting-balance support: No upper extremity supported;Feet supported Sitting balance-Leahy Scale: Normal     Standing balance support: Single extremity supported;During functional activity;No upper extremity  supported Standing balance-Leahy Scale: Fair Standing balance comment: at sink level, able to stand for grooming                           ADL either performed or assessed with clinical judgement   ADL Overall ADL's : Modified independent                                       General ADL Comments: able to don/doff socks, able to perform sink level grooming in standing with weight shifts and visual scanning for grooming objects, educated on IADL safety concerns with decreased sensation in RUE (focus on cooking safety)     Vision Baseline Vision/History: Wears glasses Wears Glasses: At all times Patient Visual Report: No change from baseline Vision Assessment?: Yes Eye Alignment: Within Functional Limits Ocular Range of Motion: Within Functional Limits Alignment/Gaze Preference: Within Defined Limits Tracking/Visual Pursuits: Able to track stimulus in all quads without difficulty Convergence: Within functional limits Visual Fields: No apparent deficits Additional Comments: Pt states that blurred vision has resolved WFL during visual assessment and functional activity with grooming object finding in crowded box     Perception     Praxis      Pertinent Vitals/Pain Pain Assessment: No/denies pain     Hand Dominance Right   Extremity/Trunk Assessment Upper Extremity Assessment Upper Extremity Assessment: RUE deficits/detail RUE Deficits / Details: overall functional, decreased ability to write (but still legible), can open all containers, "I have to go slower than normal" RUE  Sensation: decreased light touch RUE Coordination: decreased fine motor (very minor)   Lower Extremity Assessment Lower Extremity Assessment: RLE deficits/detail RLE Deficits / Details: decreased sensation and "makes me nervous" RLE Sensation: decreased light touch   Cervical / Trunk Assessment Cervical / Trunk Assessment: Normal   Communication  Communication Communication: No difficulties   Cognition Arousal/Alertness: Awake/alert Behavior During Therapy: WFL for tasks assessed/performed Overall Cognitive Status: Within Functional Limits for tasks assessed                                     General Comments  Son present throughout session    Exercises     Shoulder Instructions      Home Living Family/patient expects to be discharged to:: Private residence Living Arrangements: Children;Other relatives Available Help at Discharge: Family;Available 24 hours/day Type of Home: House Home Access: Level entry     Home Layout: One level     Bathroom Shower/Tub: Occupational psychologist: Handicapped height Bathroom Accessibility: Yes How Accessible: Accessible via walker Home Equipment: Other (comment)   Additional Comments: walking stick she used to use for hiking- "sometimes I use it sometimes I don't, it depends on how floppy I feel"; uses walls sometimes for balance      Prior Functioning/Environment Level of Independence: Independent        Comments: wall walks frequently        OT Problem List: Impaired balance (sitting and/or standing);Decreased strength;Decreased coordination;Impaired sensation      OT Treatment/Interventions:      OT Goals(Current goals can be found in the care plan section) Acute Rehab OT Goals Patient Stated Goal: get back to hiking OT Goal Formulation: With patient/family Time For Goal Achievement: 10/22/19 Potential to Achieve Goals: Good  OT Frequency:     Barriers to D/C:            Co-evaluation              AM-PAC OT "6 Clicks" Daily Activity     Outcome Measure Help from another person eating meals?: None Help from another person taking care of personal grooming?: None Help from another person toileting, which includes using toliet, bedpan, or urinal?: None Help from another person bathing (including washing, rinsing, drying)?: A  Little (supervision for safety) Help from another person to put on and taking off regular upper body clothing?: None Help from another person to put on and taking off regular lower body clothing?: None 6 Click Score: 23   End of Session Equipment Utilized During Treatment: Gait belt Nurse Communication: Mobility status  Activity Tolerance: Patient tolerated treatment well Patient left: in chair;with family/visitor present  OT Visit Diagnosis: Unsteadiness on feet (R26.81);Other symptoms and signs involving cognitive function;Hemiplegia and hemiparesis Hemiplegia - Right/Left: Right Hemiplegia - dominant/non-dominant: Dominant Hemiplegia - caused by: Cerebral infarction                Time: 8453-6468 OT Time Calculation (min): 24 min Charges:  OT General Charges $OT Visit: 1 Visit OT Evaluation $OT Eval Low Complexity: [place] OTR/L Acute Rehabilitation Services Pager: [phone] Office: [place] 10/08/2019, 12:29 PM

## 2019-10-08 NOTE — Plan of Care (Signed)
  Problem: Education: Goal: Knowledge of General Education information will improve Description: Including pain rating scale, medication(s)/side effects and non-pharmacologic comfort measures Outcome: Completed/Met

## 2019-10-08 NOTE — Progress Notes (Addendum)
STROKE TEAM PROGRESS NOTE   INTERVAL HISTORY No acute events overnight. She states that the clumsiness to her left hand has improved with the finger to nose assessment.   Vitals:   10/08/19 0100 10/08/19 0400 10/08/19 0756 10/08/19 1222  BP: (!) 123/53 126/75 (!) 112/57 120/73  Pulse: 61 69 63 64  Resp: 18 18 18 18   Temp: 98.6 F (37 C) 98.2 F (36.8 C) 98.1 F (36.7 C) 98.1 F (36.7 C)  TempSrc: Oral Oral Oral Oral  SpO2: 96% 93% 95% 96%   Pertinent Imaging and Lab Work  10/07/19 MRI Brain WO IV Contrast Two punctate acute infarcts of the dorsal left superior medulla and opposing inferior cerebellum. Small chronic left cerebellar infarcts. 10/07/19 CT Angio Head Neck W WO IV Contrast  1. Negative for intracranial large vessel occlusion. 2. 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 3. 25% diameter stenosis proximal right internal carotid artery and 50% diameter stenosis proximal left internal carotid artery 4. Mild to moderate stenosis distal right vertebral artery. 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 10/07/19 Echocardiogram  LVEF 40 to 45 %, left ventricle with mildly decreased function and regional wall abnormalities, Right ventricle with grossly normal systolic function with mild dilation. Left ventricular diastolic parameters are consistent with Grade I diastolic dysfunction Stroke Labs: 10/08/19 Hemoglobin A1C: 7.0  10/08/19 Lipid panel: Cholesterol 183,LDL 71   PHYSICAL EXAM Constitutional: Calm, resting in chair ER:DEYC1, following commands  Speech: No dysarthria noted, naming and repetition intact  Cranial nerves: EOMI, VFF, No facial droop appreciated  Motor: Strength 5/5 throughout  Sensation: Decreased to light touch in the RUE and RLE  Coordination: Subtle ataxia with left hand, no ataxia HTS  Gait: Slow cautious gait  NIHSS: 2 for LUE ataxia + sensory deficit    ASSESSMENT/PLAN Ms. DALILAH CURLIN is  a 74 y.o. female w/pmh of PE, HTN, HLD, DVT, CAD who presents with gait instability and decreased sensation to the right leg and right arm found to have stroke to the left superior medulla and opposing anterior cerebellum via MRI. Upon presentation she was outside of the time window for both IVTPA and mechanical thrombectomy. Her CTA Head and Neck was notable for occlusion of the left vertebral artery at the skull base, mild to moderate stenosis of the distal right vertebral artery and stenosis of both the right and left internal carotid artery. Echocardiogram revealed reduced EF of 40 to 45 % no cardio embolic stroke source. Given CTA findings of stenosis her stroke is likely in the setting of atherosclerosis. Apparently at home she was taking Aspirin 81 and Plavix for CAD. Her Aspirin was increased to 325 this admission given new stroke. Stroke risk factors include HTN, HLD, Diabetes. Her stroke work up is complete at this time.   Left Superior Medulla + Cerebellar Stroke - Continue Aspirin 325 + Plavix 75 for 90 days. After 90 days she can drop to 81 mg Aspirin  - Continue Crestor 40 for secondary stroke prevention  - Outpatient stroke follow up at discharge   Hypertension - SBP trending normotensive  - Her symptoms started on the 12, 4 days ago, can target SBP < 160 and slowly normalize in 3 days   Hyperlipidemia - Continue home Crestor 40 mg  - LDL 71, goal <70   Diabetes type II Uncontrolled  - New diagnosis for her. Recommend initiating Metformin at discharge and following up with PCP for diabetes.  - HgbA1c 7.0,  goal < 7.0  Neurology will sign off, please refer to Amion to contact if there are any questions.    Ruta Hinds, NP  Stroke Nurse Practitioner Patient discussed with attending physician Dr. Erlinda Hong   ATTENDING NOTE: I reviewed above note and agree with the assessment and plan. Pt was seen and examined.   74 year old female with history of hypertension, hyperlipidemia,  PE/DVT, CAD status post stent, CHF with pacemaker admitted for dizziness, imbalance.  Initial MRI negative for stroke.  Patient later on also developed left arm weakness and right-sided numbness.  Repeat MRI showed 2 punctate infarcts at medulla.  Pacemaker interrogation no A. fib.  CT head and neck showed left VA occlusion, right VA mild to moderate stenosis.  Left ICA 50% stenosis bilateral siphon moderate atherosclerosis.  EF 40 to 45%.  UDS negative.  A1c 7.0 and LDL 71.  Creatinine 0.74.  On exam, awake alert, orientated x3.  No aphasia, follows simple commands.  Able to name and repeat.  No dysarthria.  Cranial nerves negative.  Still has mildly decreased light touch on the right side.  Moving all extremities symmetrically.  No ataxia.  Gait not tested.  Patient improved well.  Stroke likely some Isosource due to location and risk factors.  Given vertebral artery occlusion/stenosis, recommend aspirin 325 and Plavix 75 DAPT for 3 months and then back to aspirin 81 and Plavix 75 home dose.  Continue Crestor and Zetia home medication.  Continue follow-up with Dr. Lovena Le for cardiomyopathy and pacemaker.  Answered all patient and family questions.  Neurology will sign off. Please call with questions. Pt will follow up with stroke clinic NP at [facility] in about 4 weeks. Thanks for the consult.   Rosalin Hawking, MD PhD Stroke Neurology 10/08/2019 7:59 PM

## 2019-10-08 NOTE — Evaluation (Signed)
Physical Therapy Evaluation Patient Details Name: April Lowery MRN: [id number] DOB: 1945-06-16 Today's Date: 10/08/2019   History of Present Illness  April Lowery is a 74 y.o. female with history of pulmonary embolism, hypertension, hyperlipidemia, DVT, CAD. Presented with dizziness, blurred vision, trouble ambulating (right leg sensation changes) and left arm troubles. MRI revealed 2 punctate acute infarcts of the dorsal left superior medulla and opposing anterior cerebellum.  Clinical Impression   Patient received in bed, pleasant and willing to participate in session today; son arrived part way through session and assisted in providing PLOF/history as well. See below for mobility/assist levels. Generally needed MinA to maintain safety and balance with dynamic activities and gait due to gross unsteadiness as well as impaired sensation and reduced proprioception R LE. Would benefit from RW as well as skilled PT services in outpatient neuro PT setting. Left sitting up in chair with son and OT present and attending, all needs otherwise met this morning.     Follow Up Recommendations Outpatient PT;Other (comment);Supervision for mobility/OOB ([facility] neuro OP PT)    Equipment Recommendations  Rolling walker with 5" wheels    Recommendations for Other Services       Precautions / Restrictions Precautions Precautions: Fall Restrictions Weight Bearing Restrictions: No      Mobility  Bed Mobility Overal bed mobility: Independent                Transfers Overall transfer level: Needs assistance Equipment used: None Transfers: Sit to/from Stand Sit to Stand: Min guard         General transfer comment: min guard for safety, no physical assist given  Ambulation/Gait Ambulation/Gait assistance: Min assist Gait Distance (Feet): 120 Feet Assistive device: IV Pole Gait Pattern/deviations: Step-through pattern;Trendelenburg;Drifts right/left;Trunk flexed;Narrow base of  support Gait velocity: decr   General Gait Details: mildly unsteady even with support of IV pole, occasionally reaching out for items like wall or railing in hallway; light MinA in general just to maintain balance and safety  Stairs            Wheelchair Mobility    Modified Rankin (Stroke Patients Only)       Balance Overall balance assessment: Needs assistance Sitting-balance support: No upper extremity supported;Feet supported Sitting balance-Leahy Scale: Good     Standing balance support: Single extremity supported;During functional activity Standing balance-Leahy Scale: Poor Standing balance comment: light MinA to maintain balance with U UE support on IV pole                             Pertinent Vitals/Pain Pain Assessment: No/denies pain    Home Living Family/patient expects to be discharged to:: Private residence Living Arrangements: Children;Other relatives (son and his familiy) Available Help at Discharge: Family;Available 24 hours/day Type of Home: House Home Access: Level entry     Home Layout: One level Home Equipment: Other (comment) Additional Comments: walking stick she used to use for hiking- "sometimes I use it sometimes I don't, it depends on how floppy I feel"; uses walls sometimes for balance    Prior Function Level of Independence: Independent         Comments: wall walks frequently     Hand Dominance        Extremity/Trunk Assessment   Upper Extremity Assessment Upper Extremity Assessment: Defer to OT evaluation    Lower Extremity Assessment Lower Extremity Assessment: Overall WFL for tasks assessed    Cervical / Trunk  Assessment Cervical / Trunk Assessment: Normal  Communication   Communication: No difficulties  Cognition Arousal/Alertness: Awake/alert Behavior During Therapy: WFL for tasks assessed/performed Overall Cognitive Status: Within Functional Limits for tasks assessed                                         General Comments      Exercises     Assessment/Plan    PT Assessment Patient needs continued PT services  PT Problem List Decreased strength;Decreased knowledge of use of DME;Decreased activity tolerance;Decreased safety awareness;Decreased balance;Decreased mobility;Decreased coordination;Impaired sensation       PT Treatment Interventions DME instruction;Balance training;Gait training;Neuromuscular re-education;Stair training;Functional mobility training;Therapeutic activities;Therapeutic exercise;Patient/family education    PT Goals (Current goals can be found in the Care Plan section)  Acute Rehab PT Goals Patient Stated Goal: get better PT Goal Formulation: With patient/family Time For Goal Achievement: 10/22/19 Potential to Achieve Goals: Good    Frequency Min 3X/week   Barriers to discharge        Co-evaluation               AM-PAC PT "6 Clicks" Mobility  Outcome Measure Help needed turning from your back to your side while in a flat bed without using bedrails?: None Help needed moving from lying on your back to sitting on the side of a flat bed without using bedrails?: None Help needed moving to and from a bed to a chair (including a wheelchair)?: A Little Help needed standing up from a chair using your arms (e.g., wheelchair or bedside chair)?: A Little Help needed to walk in hospital room?: A Little Help needed climbing 3-5 steps with a railing? : A Little 6 Click Score: 20    End of Session Equipment Utilized During Treatment: Gait belt Activity Tolerance: Patient tolerated treatment well Patient left: in chair;with call bell/phone within reach;with family/visitor present;Other (comment) (in care of OT) Nurse Communication: Mobility status PT Visit Diagnosis: Unsteadiness on feet (R26.81);Difficulty in walking, not elsewhere classified (R26.2);Other symptoms and signs involving the nervous system (R29.898)    Time:  0177-9390 PT Time Calculation (min) (ACUTE ONLY): 23 min   Charges:   PT Evaluation $PT Eval Moderate Complexity: 1 Mod (co-eval)           Windell Norfolk, DPT, PN1   Supplemental Physical Therapist [place]    Pager [phone] Acute Rehab Office [phone]

## 2019-10-08 NOTE — Progress Notes (Signed)
Nsg Discharge Note  Admit Date:  10/06/2019 Discharge date: 10/08/2019   April Lowery to be D/C'd Home per MD order.  AVS completed.  Copy for chart, and copy for patient signed, and dated. Patient/caregiver able to verbalize understanding.  Discharge Medication: Allergies as of 10/08/2019      Reactions   Penicillins Rash      Medication List    STOP taking these medications   lisinopril 5 MG tablet Commonly known as: ZESTRIL     TAKE these medications   acetaminophen 650 MG CR tablet Commonly known as: TYLENOL Take 650 mg by mouth every 8 (eight) hours as needed for pain.   aspirin 325 MG EC tablet Take 1 tablet (325 mg total) by mouth daily. Start taking on: October 09, 2019   B-12 2500 MCG Tabs Take 1 tablet by mouth daily.   cholecalciferol 25 MCG (1000 UNIT) tablet Commonly known as: VITAMIN D3 Take 1,000 Units by mouth daily.   clopidogrel 75 MG tablet Commonly known as: PLAVIX Take 1 tablet (75 mg total) by mouth daily.   ezetimibe 10 MG tablet Commonly known as: ZETIA Take 1 tablet (10 mg total) by mouth daily.   meclizine 12.5 MG tablet Commonly known as: ANTIVERT Take 1 tablet (12.5 mg total) by mouth 3 (three) times daily as needed for dizziness.   metoprolol succinate 50 MG 24 hr tablet Commonly known as: TOPROL-XL Take 1 tablet (50 mg total) by mouth daily. Take with or immediately following a meal.   omeprazole 20 MG capsule Commonly known as: PRILOSEC Take 20 mg by mouth as needed (acid reflux).   rosuvastatin 40 MG tablet Commonly known as: CRESTOR Take 1 tablet (40 mg total) by mouth daily. What changed: when to take this   sertraline 50 MG tablet Commonly known as: ZOLOFT Take 1 tablet (50 mg total) by mouth daily.            Durable Medical Equipment  (From admission, onward)         Start     Ordered   10/08/19 1408  For home use only DME Walker rolling  Once       Question Answer Comment  Walker: With 5 Inch Wheels    Patient needs a walker to treat with the following condition Weakness      10/08/19 1408   10/08/19 0000  For home use only DME 4 wheeled rolling walker with seat       Comments: Needs 5 inch wheels  Question:  Patient needs a walker to treat with the following condition  Answer:  Stroke (cerebrum) ([place])   10/08/19 1640          Discharge Assessment: Vitals:   10/08/19 1222 10/08/19 1609  BP: 120/73 130/60  Pulse: 64 63  Resp: 18 18  Temp: 98.1 F (36.7 C) 97.7 F (36.5 C)  SpO2: 96% 95%   Skin clean, dry and intact without evidence of skin break down, no evidence of skin tears noted. IV catheter discontinued intact. Site without signs and symptoms of complications - no redness or edema noted at insertion site, patient denies c/o pain - only slight tenderness at site.  Dressing with slight pressure applied.  D/c Instructions-Education: Discharge instructions given to patient/family with verbalized understanding. D/c education completed with patient/family including follow up instructions, medication list, d/c activities limitations if indicated, with other d/c instructions as indicated by MD - patient able to verbalize understanding, all questions fully answered. Patient  instructed to return to ED, call 911, or call MD for any changes in condition.  Patient escorted via [place], and D/C home via private auto.  Erasmo Leventhal, RN 10/08/2019 6:00 PM

## 2019-10-08 NOTE — Progress Notes (Signed)
SLP Cancellation Note  Patient Details Name: KANDA DELUNA MRN: [id number] DOB: 1945-09-02   Cancelled treatment:       Reason Eval/Treat Not Completed: SLP screened, no needs identified, will sign off   Marrietta Thunder, Katherene Ponto 10/08/2019, 12:59 PM

## 2019-10-08 NOTE — Discharge Summary (Signed)
April Lowery ATF:[id number] DOB: 03/19/1946 DOA: 10/06/2019  PCP: Martinique, Betty G, MD  Admit date: 10/06/2019 Discharge date: 10/08/2019  Admitted From: Home Disposition: Home  Recommendations for Outpatient Follow-up:  1. Follow up with PCP in 1 week 2. Please obtain BMP/CBC in one week 3. Neurology in 4 weeks Dr. Rhunette Lowery u  Home Health: Yes   Discharge Condition:Stable CODE STATUS: Full Diet recommendation: Heart Healthy  Brief/Interim Summary: April Lowery is a 74 y.o. female with medical history significant of CAD with multiple stents, remote history of DVT and PE, ischemic cardiomyopathy status post ICD and PPM, H TN, HLD, presented with dizziness blurred vision and right-sided weakness numbness.  Symptoms started on Monday, initially with sudden onset of lightheadedness and blurry visions.  Came to the ED, MRI showed negative for acute stroke, patient was discharged with as needed meclizine.  She has been taking meclizine 2 times a day for the last 3 days and her lightheaded symptoms significantly improved.   [name] started to feel right-sided numbness, symptoms persisted through this morning.  Patient denied any weakness of any of her limbs, no headache no blurry vision no hearing changes. MRI showed two punctate acute infarcts of the dorsal left superior medulla and opposing inferior cerebellum. Small chronic left cerebellar infarcts.  Neurology was consulted and she was admitted to the hospital service  CVA (cerebral vascular accident) [facility])  (please populate well all problems here in Problem List. (For example, if patient is on BP meds at home and you resume or decide to hold them, it is a problem that needs to be her. Same for CAD, COPD, HLD and so on)  Right-sided paresthesia Echo with EF 40-45%.  TEE was canceled.  CTA results below . Neurology stated via chat patient can go home will need aspirin 325 mg plus Plavix 75 mg for 3 months and then go down  to aspirin 81 mg plus Plavix 75 daily Continue statins and Zetia Goal LDL less than 70 We will need follow-up with cardiology in 1 week    Vertigo likely from acute cerebral infarction Improved.  Needs home PT  HTN Stable   HLD Continue statin and Zetia  History of CAD and ischemic cardiomyopathy Asymptomatic Echo as stated above Follow-up with cardiology   Discharge Diagnoses:  Active Problems:   CVA (cerebral vascular accident) [facility])    Discharge Instructions  Discharge Instructions    Call MD for:  persistant dizziness or light-headedness   Complete by: As directed    Diet - low sodium heart healthy   Complete by: As directed    Discharge instructions   Complete by: As directed    You will take aspirin 325 mg plus Plavix 75 mg daily for 3 months after that you can take aspirin 81 mg plus Plavix 75 mg. Hold her lisinopril and follow-up with your primary care We will follow up with neurology in 4 weeks   For home use only DME 4 wheeled rolling walker with seat   Complete by: As directed    Needs 5 inch wheels   Patient needs a walker to treat with the following condition: Stroke (cerebrum) (HCC)   Increase activity slowly   Complete by: As directed      Allergies as of 10/08/2019      Reactions   Penicillins Rash      Medication List    STOP taking these medications   lisinopril 5 MG tablet Commonly known as:  ZESTRIL     TAKE these medications   acetaminophen 650 MG CR tablet Commonly known as: TYLENOL Take 650 mg by mouth every 8 (eight) hours as needed for pain.   aspirin 325 MG EC tablet Take 1 tablet (325 mg total) by mouth daily. Start taking on: October 09, 2019   B-12 2500 MCG Tabs Take 1 tablet by mouth daily.   cholecalciferol 25 MCG (1000 UNIT) tablet Commonly known as: VITAMIN D3 Take 1,000 Units by mouth daily.   clopidogrel 75 MG tablet Commonly known as: PLAVIX Take 1 tablet (75 mg total) by mouth daily.   ezetimibe 10 MG  tablet Commonly known as: ZETIA Take 1 tablet (10 mg total) by mouth daily.   meclizine 12.5 MG tablet Commonly known as: ANTIVERT Take 1 tablet (12.5 mg total) by mouth 3 (three) times daily as needed for dizziness.   metoprolol succinate 50 MG 24 hr tablet Commonly known as: TOPROL-XL Take 1 tablet (50 mg total) by mouth daily. Take with or immediately following a meal.   omeprazole 20 MG capsule Commonly known as: PRILOSEC Take 20 mg by mouth as needed (acid reflux).   rosuvastatin 40 MG tablet Commonly known as: CRESTOR Take 1 tablet (40 mg total) by mouth daily. What changed: when to take this   sertraline 50 MG tablet Commonly known as: ZOLOFT Take 1 tablet (50 mg total) by mouth daily.            Durable Medical Equipment  (From admission, onward)         Start     Ordered   10/08/19 1408  For home use only DME Walker rolling  Once       Question Answer Comment  Walker: With [place] Wheels   Patient needs a walker to treat with the following condition Weakness      10/08/19 1408   10/08/19 0000  For home use only DME 4 wheeled rolling walker with seat       Comments: Needs 5 inch wheels  Question:  Patient needs a walker to treat with the following condition  Answer:  Stroke (cerebrum) ([place])   10/08/19 1640          Follow-up Information    Martinique, Betty G, MD Follow up.   Specialty: Family Medicine Why: October 18, 2019 at 3:30 pm  Contact information: [place] [address] [phone]        April Hawking, MD Follow up in 4 week(s).   Specialty: Neurology Contact information: [place] [address] [phone]        April Jamaica, PA-C Follow up in 1 week(s).   Specialty: Cardiology Why: s/p stroke. new echo Contact information: [address] [place] [address] [phone]              Allergies  Allergen Reactions  . Penicillins Rash     Consultations:  Neurology   Procedures/Studies: CT Angio Head W or Wo Contrast  Result Date: 10/07/2019 CLINICAL DATA:  Stroke. EXAM: CT ANGIOGRAPHY HEAD AND NECK TECHNIQUE: Multidetector CT imaging of the head and neck was performed using the standard protocol during bolus administration of intravenous contrast. Multiplanar CT image reconstructions and MIPs were obtained to evaluate the vascular anatomy. Carotid stenosis measurements (when applicable) are obtained utilizing NASCET criteria, using the distal internal carotid diameter as the denominator. CONTRAST:  3mL OMNIPAQUE IOHEXOL 300 MG/ML  SOLN COMPARISON:  MRI head 10/07/2019.  CT head 10/06/2019 FINDINGS: CTA NECK FINDINGS Aortic arch: Atherosclerotic aortic arch. Mild atherosclerotic disease proximal great vessels without significant stenosis. Right carotid system: Mild atherosclerotic disease right carotid bifurcation. 25% diameter stenosis proximal right internal carotid artery. 50% diameter stenosis proximal right external carotid artery. Left carotid system: Atherosclerotic calcification left carotid bifurcation. 50% diameter stenosis proximal left internal carotid artery. Mild stenosis proximal left external carotid artery. Vertebral arteries: Right vertebral artery dominant. Mild to moderate calcific stenosis right vertebral artery at the skull base. Right vertebral artery supplies the basilar. Non dominant left vertebral artery occludes at the skull base due to calcific plaque. Skeleton: No acute skeletal abnormality. Other neck: Negative for mass or adenopathy in the neck. Upper chest: Lung apices clear bilaterally. Review of the MIP images confirms the above findings CTA HEAD FINDINGS Anterior circulation: Atherosclerotic calcification cavernous carotid bilaterally with moderate stenosis bilaterally. Anterior and middle cerebral arteries patent bilaterally without significant stenosis. Posterior circulation: Mild to moderate  stenosis distal right vertebral artery. Left vertebral artery occludes at the skull base. Small PICA patent bilaterally. Basilar patent without stenosis. Superior cerebellar and posterior cerebral arteries patent bilaterally without significant stenosis. Venous sinuses: Normal venous enhancement Anatomic variants: None Review of the MIP images confirms the above findings IMPRESSION: 1. Negative for intracranial large vessel occlusion. 2. 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 3. 25% diameter stenosis proximal right internal carotid artery and 50% diameter stenosis proximal left internal carotid artery 4. Mild to moderate stenosis distal right vertebral artery. 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 Electronically Signed   By: Franchot Gallo M.D.   On: 10/07/2019 17:47   DG Chest 2 View  Result Date: 10/07/2019 CLINICAL DATA:  Right-sided numbness. EXAM: CHEST - 2 VIEW COMPARISON:  October 04, 2019. FINDINGS: The heart size and mediastinal contours are within normal limits. Both lungs are clear. No pneumothorax or pleural effusion is noted. Left-sided pacemaker is unchanged in position. Sternotomy wires are noted. The visualized skeletal structures are unremarkable. IMPRESSION: No active cardiopulmonary disease. Aortic Atherosclerosis (ICD10-I70.0). Electronically Signed   By: Marijo Conception M.D.   On: 10/07/2019 13:46   CT HEAD WO CONTRAST  Result Date: 10/06/2019 CLINICAL DATA:  74 year old female with focal neurologic deficit. EXAM: CT HEAD WITHOUT CONTRAST TECHNIQUE: Contiguous axial images were obtained from the base of the skull through the vertex without intravenous contrast. COMPARISON:  Brain MRI dated 10/04/2019. FINDINGS: Brain: The ventricles and sulci appropriate size for patient's age. The gray-white matter discrimination is preserved. There is no acute intracranial hemorrhage. No mass effect or midline shift. No extra-axial fluid  collection. Vascular: No hyperdense vessel or unexpected calcification. Skull: Normal. Negative for fracture or focal lesion. Sinuses/Orbits: No acute finding. Other: None IMPRESSION: No acute intracranial pathology. Electronically Signed   By: Anner Crete M.D.   On: 10/06/2019 21:15   CT Angio Neck W and/or Wo Contrast  Result Date: 10/07/2019 CLINICAL DATA:  Stroke. EXAM: CT ANGIOGRAPHY HEAD AND NECK TECHNIQUE: Multidetector CT imaging of the head and neck was performed using the standard protocol during bolus administration of intravenous contrast. Multiplanar CT image reconstructions and MIPs were obtained to evaluate the vascular anatomy. Carotid stenosis measurements (when applicable) are obtained utilizing NASCET criteria, using the distal internal carotid diameter as the denominator. CONTRAST:  63mL OMNIPAQUE IOHEXOL 300 MG/ML  SOLN COMPARISON:  MRI head 10/07/2019.  CT head 10/06/2019 FINDINGS: CTA NECK FINDINGS Aortic arch: Atherosclerotic aortic arch.  Mild atherosclerotic disease proximal great vessels without significant stenosis. Right carotid system: Mild atherosclerotic disease right carotid bifurcation. 25% diameter stenosis proximal right internal carotid artery. 50% diameter stenosis proximal right external carotid artery. Left carotid system: Atherosclerotic calcification left carotid bifurcation. 50% diameter stenosis proximal left internal carotid artery. Mild stenosis proximal left external carotid artery. Vertebral arteries: Right vertebral artery dominant. Mild to moderate calcific stenosis right vertebral artery at the skull base. Right vertebral artery supplies the basilar. Non dominant left vertebral artery occludes at the skull base due to calcific plaque. Skeleton: No acute skeletal abnormality. Other neck: Negative for mass or adenopathy in the neck. Upper chest: Lung apices clear bilaterally. Review of the MIP images confirms the above findings CTA HEAD FINDINGS Anterior  circulation: Atherosclerotic calcification cavernous carotid bilaterally with moderate stenosis bilaterally. Anterior and middle cerebral arteries patent bilaterally without significant stenosis. Posterior circulation: Mild to moderate stenosis distal right vertebral artery. Left vertebral artery occludes at the skull base. Small PICA patent bilaterally. Basilar patent without stenosis. Superior cerebellar and posterior cerebral arteries patent bilaterally without significant stenosis. Venous sinuses: Normal venous enhancement Anatomic variants: None Review of the MIP images confirms the above findings IMPRESSION: 1. Negative for intracranial large vessel occlusion. 2. 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 3. 25% diameter stenosis proximal right internal carotid artery and 50% diameter stenosis proximal left internal carotid artery 4. Mild to moderate stenosis distal right vertebral artery. 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 Electronically Signed   By: Franchot Gallo M.D.   On: 10/07/2019 17:47   MR BRAIN WO CONTRAST  Result Date: 10/07/2019 CLINICAL DATA:  Numbness and tingling right upper and lower extremities EXAM: MRI HEAD WITHOUT CONTRAST TECHNIQUE: Multiplanar, multiecho pulse sequences of the brain and surrounding structures were obtained without intravenous contrast. COMPARISON:  10/04/2019 FINDINGS: Brain: There is a tiny focus of diffusion and T2 hyperintensity at the left dorsal aspect of the superior medulla. Adjacent focus appears to be involving the opposing inferior cerebellum. Corresponding ADC intensity difficult evaluate due to small size but abnormal signal was not present on recent prior study. There is no evidence of intracranial hemorrhage. There is no intracranial mass, mass effect, or edema. There is no hydrocephalus or extra-axial fluid collection. Small chronic left cerebellar infarcts again noted. Vascular: Major  vessel flow voids at the skull base are preserved. Skull and upper cervical spine: Normal marrow signal is preserved. Sinuses/Orbits: Paranasal sinuses are aerated. Orbits are unremarkable. Other: Sella is unremarkable.  Mastoid air cells are clear. IMPRESSION: Two punctate acute infarcts of the dorsal left superior medulla and opposing inferior cerebellum. Small chronic left cerebellar infarcts. Electronically Signed   By: Macy Mis M.D.   On: 10/07/2019 10:41   MR BRAIN WO CONTRAST  Result Date: 10/04/2019 CLINICAL DATA:  Dizziness EXAM: MRI HEAD WITHOUT CONTRAST TECHNIQUE: Multiplanar, multiecho pulse sequences of the brain and surrounding structures were obtained without intravenous contrast. COMPARISON:  None. FINDINGS: Brain: There is no acute infarction or intracranial hemorrhage. There is no intracranial mass, mass effect, or edema. There is no hydrocephalus or extra-axial fluid collection. Ventricles and sulci are normal in size and configuration. Three small chronic left cerebellar infarcts. Vascular: Major vessel flow voids at the skull base are preserved. Skull and upper cervical spine: Normal marrow signal is preserved. Sinuses/Orbits: Mild mucosal thickening.  Orbits are unremarkable. Other: Sella is unremarkable.  Mastoid air cells are clear. IMPRESSION: No evidence of recent infarction, hemorrhage, or  mass. Small chronic left cerebellar infarcts. Electronically Signed   By: Macy Mis M.D.   On: 10/04/2019 14:47   MR Cervical Spine Wo Contrast  Result Date: 10/07/2019 CLINICAL DATA:  Right extremity numbness and tingling EXAM: MRI CERVICAL SPINE WITHOUT CONTRAST TECHNIQUE: Multiplanar, multisequence MR imaging of the cervical spine was performed. No intravenous contrast was administered. COMPARISON:  None. FINDINGS: Motion artifact is present. Alignment: Anteroposterior alignment is maintained. Vertebrae: Vertebral body heights are preserved. There is no marrow edema or suspicious  osseous lesion. Cord: No abnormal signal. Posterior Fossa, vertebral arteries, paraspinal tissues: Unremarkable. Disc levels: C2-C3:  No significant canal or foraminal stenosis. C3-C4: Tiny central disc protrusion. Mild facet hypertrophy. No significant canal or foraminal stenosis. C4-C5: Minimal disc bulge. Mild facet hypertrophy. No significant canal or foraminal stenosis. C5-C6: Disc bulge with central disc protrusion, endplate osteophytes, and facet and uncovertebral hypertrophy. Mild canal stenosis. No significant foraminal stenosis. C6-C7: Minimal disc bulge. No significant canal or foraminal stenosis. C7-T1:  No significant canal or foraminal stenosis. IMPRESSION: No abnormal cord signal. Overall mild degenerative changes without high-grade stenosis. Electronically Signed   By: Macy Mis M.D.   On: 10/07/2019 11:06   DG CHEST PORT 1 VIEW  Result Date: 10/04/2019 CLINICAL DATA:  Pacemaker EXAM: PORTABLE CHEST 1 VIEW COMPARISON:  None. FINDINGS: Left subclavian dual lead pacemaker in good position with leads in the right atrium and right ventricle. Postop CABG. Lungs are well aerated and clear. Negative for infiltrate effusion or edema. Negative for heart failure. Atherosclerotic aortic arch. Soft tissue calcification in the rotator cuff on the right compatible with calcific tendinitis. IMPRESSION: No active disease. Electronically Signed   By: Franchot Gallo M.D.   On: 10/04/2019 12:46   ECHOCARDIOGRAM COMPLETE  Result Date: 10/07/2019    ECHOCARDIOGRAM REPORT   Patient Name:   April Lowery Kory Date of Exam: 10/07/2019 Medical Rec #:  [id number]     Height:       65.0 in Accession #:    [id number]    Weight:       181.0 lb Date of Birth:  1945/04/18      BSA:          1.896 m Patient Age:    74 years      BP:           112/58 mmHg Patient Gender: F             HR:           61 bpm. Exam Location:  Inpatient Procedure: 2D Echo, Cardiac Doppler, Color Doppler and Intracardiac            Opacification  Agent Indications:    TIA  History:        Patient has prior history of Echocardiogram examinations, most                 recent 02/10/2018. Cardiomyopathy, CAD and Previous Myocardial                 Infarction, Prior CABG; Risk Factors:Hypertension and                 Dyslipidemia. DVT, PE.  Sonographer:    Dustin Flock Referring Phys: [id number] [place]  1. Left ventricular ejection fraction, by estimation, is 40 to 45%. The left ventricle has mildly decreased function. The left ventricle demonstrates regional wall motion abnormalities (see scoring diagram/findings for description). Akinesis of inferolateral wall and apex. Left  ventricular diastolic parameters are consistent with Grade I diastolic dysfunction (impaired relaxation).  2. Right ventricule was not well visualized but grossly normal systolic function with mild dilatation. There is normal pulmonary artery systolic pressure. The estimated right ventricular systolic pressure is 41.6 mmHg.  3. The mitral valve is normal in structure. Trivial mitral valve regurgitation.  4. The aortic valve was not well visualized. Aortic valve regurgitation is not visualized. No aortic stenosis is present.  5. The inferior vena cava is normal in size with greater than 50% respiratory variability, suggesting right atrial pressure of 3 mmHg. FINDINGS  Left Ventricle: Left ventricular ejection fraction, by estimation, is 40 to 45%. The left ventricle has mildly decreased function. The left ventricle demonstrates regional wall motion abnormalities. The left ventricular internal cavity size was normal in size. There is no left ventricular hypertrophy. Left ventricular diastolic parameters are consistent with Grade I diastolic dysfunction (impaired relaxation).  LV Wall Scoring: The mid and distal lateral wall, posterior wall, and apex are akinetic. The mid and distal anterior wall is hypokinetic. The antero-lateral wall, entire septum, entire inferior wall,  and basal anterior segment are normal. Right Ventricle: The right ventricular size is not well visualized. Right vetricular wall thickness was not assessed. Right ventricular systolic function was not well visualized. There is normal pulmonary artery systolic pressure. The tricuspid regurgitant velocity is 2.34 m/s, and with an assumed right atrial pressure of 3 mmHg, the estimated right ventricular systolic pressure is 60.6 mmHg. Left Atrium: Left atrial size was normal in size. Right Atrium: Right atrial size was not well visualized. Pericardium: Trivial pericardial effusion is present. Mitral Valve: The mitral valve is normal in structure. Trivial mitral valve regurgitation. Tricuspid Valve: The tricuspid valve is normal in structure. Tricuspid valve regurgitation is trivial. Aortic Valve: The aortic valve was not well visualized. Aortic valve regurgitation is not visualized. No aortic stenosis is present. Pulmonic Valve: The pulmonic valve was not well visualized. Pulmonic valve regurgitation is not visualized. Aorta: The aortic root is normal in size and structure. Venous: The inferior vena cava is normal in size with greater than 50% respiratory variability, suggesting right atrial pressure of 3 mmHg. IAS/Shunts: No atrial level shunt detected by color flow Doppler.  LEFT VENTRICLE PLAX 2D LVIDd:         4.50 cm  Diastology LVIDs:         3.50 cm  LV e' lateral:   8.70 cm/s LV PW:         1.00 cm  LV E/e' lateral: 7.0 LV IVS:        1.10 cm  LV e' medial:    4.13 cm/s LVOT diam:     2.10 cm  LV E/e' medial:  14.7 LV SV:         64 LV SV Index:   34 LVOT Area:     3.46 cm  RIGHT VENTRICLE RV Basal diam:  3.50 cm RV S prime:     5.87 cm/s TAPSE (M-mode): 2.7 cm LEFT ATRIUM             Index       RIGHT ATRIUM           Index LA diam:        3.90 cm 2.06 cm/m  RA Area:     15.60 cm LA Vol (A2C):   35.6 ml 18.78 ml/m RA Volume:   44.20 ml  23.31 ml/m LA Vol (A4C):   40.3 ml 21.26  ml/m LA Biplane Vol: 39.0 ml  20.57 ml/m  AORTIC VALVE LVOT Vmax:   77.00 cm/s LVOT Vmean:  54.100 cm/s LVOT VTI:    0.185 m  AORTA Ao Root diam: 2.90 cm MITRAL VALVE               TRICUSPID VALVE MV Area (PHT): 2.95 cm    TR Peak grad:   21.9 mmHg MV Decel Time: 257 msec    TR Vmax:        234.00 cm/s MV E velocity: 60.80 cm/s MV A velocity: 93.80 cm/s  SHUNTS MV E/A ratio:  0.65        Systemic VTI:  0.18 m                            Systemic Diam: 2.10 cm Oswaldo Milian MD Electronically signed by Oswaldo Milian MD Signature Date/Time: 10/07/2019/11:51:01 PM    Final       Subjective: Has no complaints.  Discharge Exam: Vitals:   10/08/19 1222 10/08/19 1609  BP: 120/73 130/60  Pulse: 64 63  Resp: 18 18  Temp: 98.1 F (36.7 C) 97.7 F (36.5 C)  SpO2: 96% 95%   Vitals:   10/08/19 0400 10/08/19 0756 10/08/19 1222 10/08/19 1609  BP: 126/75 (!) 112/57 120/73 130/60  Pulse: 69 63 64 63  Resp: 18 18 18 18   Temp: 98.2 F (36.8 C) 98.1 F (36.7 C) 98.1 F (36.7 C) 97.7 F (36.5 C)  TempSrc: Oral Oral Oral Oral  SpO2: 93% 95% 96% 95%    General: Pt is alert, awake, not in acute distress Cardiovascular: RRR, S1/S2 +, no rubs, no gallops Respiratory: CTA bilaterally, no wheezing, no rhonchi Abdominal: Soft, NT, ND, bowel sounds + Extremities: no edema, no cyanosis    The results of significant diagnostics from this hospitalization (including imaging, microbiology, ancillary and laboratory) are listed below for reference.     Microbiology: Recent Results (from the past 240 hour(s))  SARS Coronavirus 2 by RT PCR (hospital order, performed in [facility] hospital lab) Nasopharyngeal Nasopharyngeal Swab     Status: None   Collection Time: 10/07/19 12:29 PM   Specimen: Nasopharyngeal Swab  Result Value Ref Range Status   SARS Coronavirus 2 NEGATIVE NEGATIVE Final    Comment: (NOTE) SARS-CoV-2 target nucleic acids are NOT DETECTED.  The SARS-CoV-2 RNA is generally detectable in upper and  lower respiratory specimens during the acute phase of infection. The lowest concentration of SARS-CoV-2 viral copies this assay can detect is 250 copies / mL. A negative result does not preclude SARS-CoV-2 infection and should not be used as the sole basis for treatment or other patient management decisions.  A negative result may occur with improper specimen collection / handling, submission of specimen other than nasopharyngeal swab, presence of viral mutation(s) within the areas targeted by this assay, and inadequate number of viral copies (<250 copies / mL). A negative result must be combined with clinical observations, patient history, and epidemiological information.  Fact Sheet for Patients:   StrictlyIdeas.no  Fact Sheet for Healthcare Providers: BankingDealers.co.za  This test is not yet approved or  cleared by the [place] FDA and has been authorized for detection and/or diagnosis of SARS-CoV-2 by FDA under an Emergency Use Authorization (EUA).  This EUA will remain in effect (meaning this test can be used) for the duration of the COVID-19 declaration under Section 564(b)(1) of the Act, 21 U.S.C. section  360bbb-3(b)(1), unless the authorization is terminated or revoked sooner.  Performed at [facility], [place] [address], [place], [place] [address]      Labs: BNP (last 3 results) No results for input(s): BNP in the last 8760 hours. Basic Metabolic Panel: Recent Labs  Lab 10/04/19 0928 10/06/19 1935  NA 139 138  K 4.0 3.9  CL 107 104  CO2 24 23  GLUCOSE 142* 132*  BUN 8 11  CREATININE 0.82 0.74  CALCIUM 9.0 9.2   Liver Function Tests: Recent Labs  Lab 10/04/19 0928 10/06/19 1935  AST 27 26  ALT 28 26  ALKPHOS 80 88  BILITOT 0.9 0.4  PROT 7.0 7.3  ALBUMIN 3.7 4.0   No results for input(s): LIPASE, AMYLASE in the last 168 hours. No results for input(s): AMMONIA in the last 168  hours. CBC: Recent Labs  Lab 10/04/19 0928 10/06/19 1935  WBC 6.1 7.4  NEUTROABS 4.1 4.2  HGB 13.3 13.5  HCT 41.3 42.4  MCV 88.6 89.6  PLT 185 219   Cardiac Enzymes: No results for input(s): CKTOTAL, CKMB, CKMBINDEX, TROPONINI in the last 168 hours. BNP: Invalid input(s): POCBNP CBG: No results for input(s): GLUCAP in the last 168 hours. D-Dimer No results for input(s): DDIMER in the last 72 hours. Hgb A1c Recent Labs    10/08/19 0450  HGBA1C 7.0*   Lipid Profile Recent Labs    10/08/19 0450  CHOL 143  HDL 35*  LDLCALC 71  TRIG 183*  CHOLHDL 4.1   Thyroid function studies No results for input(s): TSH, T4TOTAL, T3FREE, THYROIDAB in the last 72 hours.  Invalid input(s): FREET3 Anemia work up No results for input(s): VITAMINB12, FOLATE, FERRITIN, TIBC, IRON, RETICCTPCT in the last 72 hours. Urinalysis    Component Value Date/Time   COLORURINE YELLOW 10/06/2019 2131   APPEARANCEUR CLEAR 10/06/2019 2131   LABSPEC 1.009 10/06/2019 2131   PHURINE 5.0 10/06/2019 2131   [place] NEGATIVE 10/06/2019 2131   HGBUR NEGATIVE 10/06/2019 2131   [place] NEGATIVE 10/06/2019 2131   [place] NEGATIVE 10/06/2019 2131   PROTEINUR NEGATIVE 10/06/2019 2131   NITRITE NEGATIVE 10/06/2019 2131   LEUKOCYTESUR TRACE (A) 10/06/2019 2131   Sepsis Labs Invalid input(s): PROCALCITONIN,  WBC,  LACTICIDVEN Microbiology Recent Results (from the past 240 hour(s))  SARS Coronavirus 2 by RT PCR (hospital order, performed in [place] hospital lab) Nasopharyngeal Nasopharyngeal Swab     Status: None   Collection Time: 10/07/19 12:29 PM   Specimen: Nasopharyngeal Swab  Result Value Ref Range Status   SARS Coronavirus 2 NEGATIVE NEGATIVE Final    Comment: (NOTE) SARS-CoV-2 target nucleic acids are NOT DETECTED.  The SARS-CoV-2 RNA is generally detectable in upper and lower respiratory specimens during the acute phase of infection. The lowest concentration of SARS-CoV-2 viral  copies this assay can detect is 250 copies / mL. A negative result does not preclude SARS-CoV-2 infection and should not be used as the sole basis for treatment or other patient management decisions.  A negative result may occur with improper specimen collection / handling, submission of specimen other than nasopharyngeal swab, presence of viral mutation(s) within the areas targeted by this assay, and inadequate number of viral copies (<250 copies / mL). A negative result must be combined with clinical observations, patient history, and epidemiological information.  Fact Sheet for Patients:   StrictlyIdeas.no  Fact Sheet for Healthcare Providers: BankingDealers.co.za  This test is not yet approved or  cleared by the [place] FDA  and has been authorized for detection and/or diagnosis of SARS-CoV-2 by FDA under an Emergency Use Authorization (EUA).  This EUA will remain in effect (meaning this test can be used) for the duration of the COVID-19 declaration under Section 564(b)(1) of the Act, 21 U.S.C. section 360bbb-3(b)(1), unless the authorization is terminated or revoked sooner.  Performed at [facility], [place] [address] [place], [place] [address]      Time coordinating discharge: Over 30 minutes  SIGNED:   Nolberto Hanlon, MD  [facility] 10/08/2019, 4:48 PM Pager   If 7PM-7AM, please contact night-coverage [email or web address] Password [email or web address]

## 2019-10-08 NOTE — TOC Initial Note (Addendum)
Transition of Care [facility]) - Initial/Assessment Note    Patient Details  Name: April Lowery MRN: [id number] Date of Birth: 12-28-1945  Transition of Care [facility]) CM/SW Contact:    Marilu Favre, RN Phone Number: 10/08/2019, 2:11 PM  Clinical Narrative:                 41 Dr Kurtis Bushman signed OP PT referral form for April Lowery, same faxed.  PT recommending OP PT at Neuro rehab on [address] in [place]. Patient and son agreeable to same , however asking if there is a [place] closer to their home that offers same services. NCM provided list.  Follow up appointment scheduled with PCP. They would like [facility] at [address], phone [phone] NCM called and left message . [name] location in [place] , spoke with [place], their [place] location closes at 1 pm on Friday . They do provide neuro OP PT at that location referral form ( paper copy) needs to be completed and faxed .   Ordered walker with [employer].   Spoke to Neuro NP regarding OP PT. She recommended PCP arrange OP PT. PAtient and son voiced understanding. Expected Discharge Plan: Home/Self Care     Patient Goals and CMS Choice Patient states their goals for this hospitalization and ongoing recovery are:: to return to home CMS Medicare.gov Compare Post Acute Care list provided to:: Patient Choice offered to / list presented to : Adult Children, Patient  Expected Discharge Plan and Services Expected Discharge Plan: Home/Self Care   Discharge Planning Services: CM Consult   Living arrangements for the past 2 months: Single Family Home                 DME Arranged: Walker rolling DME Agency: [employer] Date DME Agency Contacted: 10/08/19 Time DME Agency Contacted: 32 Representative spoke with at DME Agency: [place]: NA          Prior Living Arrangements/Services Living arrangements for the past 2 months: April Lowery Lives with:: Relatives Patient  language and need for interpreter reviewed:: Yes Do you feel safe going back to the place where you live?: Yes      Need for Family Participation in Patient Care: Yes (Comment) Care giver support system in place?: Yes (comment)   Criminal Activity/Legal Involvement Pertinent to Current Situation/Hospitalization: No - Comment as needed  Activities of Daily Living Home Assistive Devices/Equipment: None ADL Screening (condition at time of admission) Patient's cognitive ability adequate to safely complete daily activities?: Yes Is the patient deaf or have difficulty hearing?: No Does the patient have difficulty seeing, even when wearing glasses/contacts?: No Does the patient have difficulty concentrating, remembering, or making decisions?: Yes Patient able to express need for assistance with ADLs?: Yes Does the patient have difficulty dressing or bathing?: No Independently performs ADLs?: Yes (appropriate for developmental age) Does the patient have difficulty walking or climbing stairs?: No Weakness of Legs: None Weakness of Arms/Hands: None  Permission Sought/Granted   Permission granted to share information with : Yes, Verbal Permission Granted  Share Information with NAME: son [name]           Emotional Assessment Appearance:: Appears stated age Attitude/Demeanor/Rapport: Engaged Affect (typically observed): Accepting Orientation: : Oriented to Self, Oriented to Place, Oriented to  Time, Oriented to Situation Alcohol / Substance Use: Not Applicable Psych Involvement: No (comment)  Admission diagnosis:  Numbness [R20.0] CVA (cerebral vascular accident) ([place]) [I63.9]  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 CVA (cerebral vascular accident) ([place]) 10/07/2019  . B12 deficiency 09/29/2019  . Morbid obesity ([place]) 09/29/2019  . H/O renal cell carcinoma 03/17/2019  . Anxiety disorder,  unspecified 02/24/2019  . Intermittent complete heart block ([place]) 02/16/2019  . Pacemaker 02/16/2019  . Mixed hyperlipidemia 02/15/2019  . CAD (coronary artery disease) 02/15/2019  . HTN (hypertension) 02/15/2019  . 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 Type 2 diabetes mellitus with other specified complication ([place]) 79/48/0165   PCP:  Martinique, Betty G, MD Pharmacy:   [id number] - [place], [place] - [address] [facility] [place] [address] Phone: [phone] Fax: [phone]     Social Determinants of Health (SDOH) Interventions    Readmission Risk Interventions No flowsheet data found.

## 2019-10-18 ENCOUNTER — Ambulatory Visit (INDEPENDENT_AMBULATORY_CARE_PROVIDER_SITE_OTHER): Payer: Medicare Other | Admitting: Family Medicine

## 2019-10-18 ENCOUNTER — Other Ambulatory Visit (HOSPITAL_COMMUNITY): Payer: Medicare Other

## 2019-10-18 ENCOUNTER — Other Ambulatory Visit: Payer: Self-pay

## 2019-10-18 ENCOUNTER — Encounter: Payer: Self-pay | Admitting: Family Medicine

## 2019-10-18 VITALS — BP 120/62 | HR 68 | Temp 97.8°F | Resp 12 | Ht 65.0 in | Wt 179.0 lb

## 2019-10-18 DIAGNOSIS — E785 Hyperlipidemia, unspecified: Secondary | ICD-10-CM | POA: Diagnosis not present

## 2019-10-18 DIAGNOSIS — I639 Cerebral infarction, unspecified: Secondary | ICD-10-CM | POA: Diagnosis not present

## 2019-10-18 DIAGNOSIS — I10 Essential (primary) hypertension: Secondary | ICD-10-CM

## 2019-10-18 DIAGNOSIS — E1169 Type 2 diabetes mellitus with other specified complication: Secondary | ICD-10-CM

## 2019-10-18 DIAGNOSIS — I255 Ischemic cardiomyopathy: Secondary | ICD-10-CM

## 2019-10-18 NOTE — Patient Instructions (Addendum)
A few things to remember from today's visit:  Essential hypertension - Plan: Basic metabolic panel, CBC  Hyperlipidemia, unspecified hyperlipidemia type  Cerebrovascular accident (CVA), unspecified mechanism ([place])  Keep appt with neuro. PT referral placed. No changes in rest of meds.  If you need refills please call your pharmacy. Do not use My Chart to request refills or for acute issues that need immediate attention.    Please be sure medication list is accurate. If a new problem present, please set up appointment sooner than planned today.

## 2019-10-18 NOTE — Progress Notes (Signed)
HPI:  April Lowery is a 74 y.o. female, who is here today with her son to follow on recent hospitalization.  She was admitted on 10/06/19 and discharged home on 10/08/19. She presented to the ER c/o dizziness and sudden onset of blurry vision and right-sided numbness/ice feeling.  She was in the ER a few days before admission,evaluated for dizziness, 10/04/19. CXR: Negative for acute process.  Brain MRI: No evidence of recent infarction, hemorrhage, or mass. Small chronic left cerebellar infarcts.  Dx'ed with vertigo and started on Meclizine, which helped.  At the time of admission brain MRI was repeated and showed two punctate acute infarcts of the dorsal left superior medulla and opposing inferior cerebellum.Small chronic left cerebellar infarcts.  Cervical MRI:No abnormal cord signal. Overall mild degenerative changes without high-grade stenosis.  Neuro consultation done during hospitalization, she has a f/u appt arranged.  Head and neck angiography 10/07/19:  1. Negative for intracranial large vessel occlusion. 2. 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 3. 25% diameter stenosis proximal right internal carotid artery and 50% diameter stenosis proximal left internal carotid artery 4. 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  She is on Plavix 75 mg daily. Aspirin 325 mg added and planned to take for 3 months. Crestor 40 mg and Zetia 10 mg daily.  Lab Results  Component Value Date   CHOL 143 10/08/2019   HDL 35 (L) 10/08/2019   LDLCALC 71 10/08/2019   TRIG 183 (H) 10/08/2019   CHOLHDL 4.1 10/08/2019   HTN: Lisinopril was held. She is on Metoprolol succinate 50 mg daily. Negative for severe/frequent headache, visual changes, chest pain, dyspnea, palpitation, or edema.  Lab Results  Component Value Date   CREATININE 0.74 10/06/2019   BUN 11 10/06/2019     NA 138 10/06/2019   K 3.9 10/06/2019   CL 104 10/06/2019   CO2 23 10/06/2019   Lab Results  Component Value Date   WBC 7.4 10/06/2019   HGB 13.5 10/06/2019   HCT 42.4 10/06/2019   MCV 89.6 10/06/2019   PLT 219 10/06/2019   DM II: She is on non pharmacologic treatment. Negative for polydipsia,polyuria, or polyphagia.  According to her son, she has not been very compliant with dietary recommendations.  Lab Results  Component Value Date   HGBA1C 7.0 (H) 10/08/2019   Review of Systems  Constitutional: Negative for activity change, appetite change, fatigue and fever.  HENT: Negative for mouth sores, nosebleeds and sore throat.   Respiratory: Negative for cough and wheezing.   Gastrointestinal: Negative for abdominal pain, nausea and vomiting.       Negative for changes in bowel habits.  Genitourinary: Negative for decreased urine volume, dysuria and hematuria.  Skin: Negative for rash and wound.  Neurological: Negative for syncope and facial asymmetry.  Psychiatric/Behavioral: Negative for behavioral problems and confusion.  Rest see pertinent positives and negatives per HPI.  Current Outpatient Medications on File Prior to Visit  Medication Sig Dispense Refill  . acetaminophen (TYLENOL) 650 MG CR tablet Take 650 mg by mouth every 8 (eight) hours as needed for pain.    Marland Kitchen aspirin EC 325 MG EC tablet Take 1 tablet (325 mg total) by mouth daily. 90 tablet 0  . cholecalciferol (VITAMIN D3) 25 MCG (1000 UT) tablet Take 1,000 Units by mouth daily.    . clopidogrel (PLAVIX) 75 MG tablet Take 1 tablet (75 mg total)  by mouth daily. 30 tablet 11  . Cyanocobalamin (B-12) 2500 MCG TABS Take 1 tablet by mouth daily.    Marland Kitchen ezetimibe (ZETIA) 10 MG tablet Take 1 tablet (10 mg total) by mouth daily. 30 tablet 11  . metoprolol succinate (TOPROL-XL) 50 MG 24 hr tablet Take 1 tablet (50 mg total) by mouth daily. Take with or immediately following a meal. 30 tablet 11  . omeprazole (PRILOSEC) 20  MG capsule Take 20 mg by mouth as needed (acid reflux).     . rosuvastatin (CRESTOR) 40 MG tablet Take 1 tablet (40 mg total) by mouth daily. (Patient taking differently: Take 40 mg by mouth at bedtime. ) 30 tablet 11  . sertraline (ZOLOFT) 50 MG tablet Take 1 tablet (50 mg total) by mouth daily. 90 tablet 1   No current facility-administered medications on file prior to visit.     Past Medical History:  Diagnosis Date  . Acute ST segment elevation myocardial infarction ([place])   . Arthritis   . CAD (coronary artery disease) 02/15/2019  . Cancer of left kidney ([place])   . Complete atrioventricular block (HCC)   . DVT (deep venous thrombosis) ([place]) 02/15/2019  . GI bleeding   . 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 HLD (hyperlipidemia) 02/15/2019  . HTN (hypertension) 02/15/2019  . Ischemic cardiomyopathy   . Melena   . Pacemaker   . Pulmonary thromboembolism ([place]) 02/15/2019   Allergies  Allergen Reactions  . Penicillins Rash    Social History   Socioeconomic History  . Marital status: Widowed    Spouse name: Not on file  . Number of children: 4  . Years of education: Not on file  . Highest education level: Not on file  Occupational History  . Occupation: retired  Tobacco Use  . Smoking status: Former Research scientist (life sciences)  . Smokeless tobacco: Never Used  Vaping Use  . Vaping Use: Never used  Substance and Sexual Activity  . Alcohol use: Never  . Drug use: Never  . Sexual activity: Not on file    Comment: MARRIED  Other Topics Concern  . Not on file  Social History Narrative  . Not on file   Social Determinants of Health   Financial Resource Strain: Low Risk   . Difficulty of Paying Living Expenses: Not hard at all  Food Insecurity: No Food Insecurity  . Worried About Charity fundraiser in the Last Year: Never true  . Ran Out of Food in the Last Year: Never true  Transportation Needs: No Transportation Needs  . Lack of Transportation (Medical): No  . Lack of  Transportation (Non-Medical): No  Physical Activity: Sufficiently Active  . Days of Exercise per Week: 5 days  . Minutes of Exercise per Session: 30 min  Stress: No Stress Concern Present  . Feeling of Stress : Not at all  Social Connections: Socially Isolated  . Frequency of Communication with Friends and Family: More than three times a week  . Frequency of Social Gatherings with Friends and Family: More than three times a week  . Attends Religious Services: Never  . Active Member of Clubs or Organizations: No  . Attends Archivist Meetings: Never  . Marital Status: Widowed    Vitals:   10/18/19 1504  BP: (!) 120/62  Pulse: 68  Resp: 12  Temp: 97.8 F (36.6 C)  SpO2: 92%   Body mass index is 29.79 kg/m.  Physical Exam Vitals and nursing note reviewed.  Constitutional:  General: She is not in acute distress.    Appearance: She is well-developed.  HENT:     Head: Normocephalic and atraumatic.     Mouth/Throat:     Mouth: Mucous membranes are moist.     Pharynx: Oropharynx is clear.  Eyes:     Conjunctiva/sclera: Conjunctivae normal.     Pupils: Pupils are equal, round, and reactive to light.  Cardiovascular:     Rate and Rhythm: Normal rate and regular rhythm.     Pulses:          Dorsalis pedis pulses are 2+ on the right side and 2+ on the left side.     Heart sounds: No murmur heard.   Pulmonary:     Effort: Pulmonary effort is normal. No respiratory distress.     Breath sounds: Normal breath sounds.  Abdominal:     Palpations: Abdomen is soft. There is no hepatomegaly or mass.     Tenderness: There is no abdominal tenderness.  Lymphadenopathy:     Cervical: No cervical adenopathy.  Skin:    General: Skin is warm.     Findings: No erythema or rash.  Neurological:     Mental Status: She is alert and oriented to person, place, and time.     Cranial Nerves: No cranial nerve deficit.     Sensory: Sensory deficit (RLE,RUE) present.     Motor: No  weakness or tremor.     Deep Tendon Reflexes:     Reflex Scores:      Patellar reflexes are 2+ on the right side and 2+ on the left side.    Comments: Unstable gait, not assisted today.  Psychiatric:     Comments: Well groomed, good eye contact.    ASSESSMENT AND PLAN:  April Lowery was seen today for hospitalization follow-up.  Diagnoses and all orders for this visit:  Orders Placed This Encounter  Procedures  . Basic metabolic panel  . CBC  . Ambulatory referral to Physical Therapy   Lab Results  Component Value Date   WBC 7.2 10/18/2019   HGB 13.5 10/18/2019   HCT 40.8 10/18/2019   MCV 88.7 10/18/2019   PLT 240 10/18/2019    Lab Results  Component Value Date   CREATININE 0.90 10/18/2019   BUN 11 10/18/2019   NA 139 10/18/2019   K 4.3 10/18/2019   CL 103 10/18/2019   CO2 29 10/18/2019   Type 2 diabetes mellitus with other specified complication, without long-term current use of insulin ([place]) HgA1C is slightly above goal. We discussed a few pharmacologic treatment options, she would like to hold on medication for now. She does not think she needs nutrition education at this time.  Regular exercise and healthy diet with avoidance of added sugar food intake is an important part of treatment and recommended. Annual eye exam, periodic dental and foot care recommended. F/U in 3 months  Essential hypertension BP adequately controlled. For now we are not resuming Lisinopril. Continue Metoprolol Succinate 5 mg daily. Monitor BP regularly. Low salt diet.  Hyperlipidemia, unspecified hyperlipidemia type LDL slightly above goal. She is planning improving her diet. Continue Crestor and Zetia.  PCSK9 could be considered if LDL < 70 is not achieved with current management.  Cerebrovascular accident (CVA), unspecified mechanism ([place]) Residual sensorial deficit, increased fall risk. Recommend not to drive for now. PT will be arranged. Aggressive management of CV risk  factors.  Return in about 3 months (around 01/20/2020) for DM II.  Arhan Mcmanamon G. Martinique, MD  [facility]. [place] office.   A few things to remember from today's visit:  Keep appt with neuro. PT referral placed. No changes in rest of meds.  If you need refills please call your pharmacy. Do not use My Chart to request refills or for acute issues that need immediate attention.    Please be sure medication list is accurate. If a new problem present, please set up appointment sooner than planned today.

## 2019-10-19 ENCOUNTER — Encounter: Payer: Self-pay | Admitting: Family Medicine

## 2019-10-19 LAB — BASIC METABOLIC PANEL
BUN: 11 mg/dL (ref 7–25)
CO2: 29 mmol/L (ref 20–32)
Calcium: 9.5 mg/dL (ref 8.6–10.4)
Chloride: 103 mmol/L (ref 98–110)
Creat: 0.9 mg/dL (ref 0.60–0.93)
Glucose, Bld: 94 mg/dL (ref 65–99)
Potassium: 4.3 mmol/L (ref 3.5–5.3)
Sodium: 139 mmol/L (ref 135–146)

## 2019-10-19 LAB — CBC
HCT: 40.8 % (ref 35.0–45.0)
Hemoglobin: 13.5 g/dL (ref 11.7–15.5)
MCH: 29.3 pg (ref 27.0–33.0)
MCHC: 33.1 g/dL (ref 32.0–36.0)
MCV: 88.7 fL (ref 80.0–100.0)
MPV: 11.4 fL (ref 7.5–12.5)
Platelets: 240 10*3/uL (ref 140–400)
RBC: 4.6 10*6/uL (ref 3.80–5.10)
RDW: 12.7 % (ref 11.0–15.0)
WBC: 7.2 10*3/uL (ref 3.8–10.8)

## 2019-10-21 ENCOUNTER — Other Ambulatory Visit: Payer: Self-pay | Admitting: *Deleted

## 2019-10-21 ENCOUNTER — Encounter: Payer: Self-pay | Admitting: Family Medicine

## 2019-10-21 NOTE — Patient Outreach (Addendum)
[facility]) Care Management  10/21/2019  April Lowery 1946/01/24 [id number]   EMMI- stroke  RED ON EMMI ALERT Day #  Date:  Red Alert Reason:   Insurance: [employer] Medicare and bankers supplement [facility] admissions x  1 ED visits x in the last 6 months    Outreach attempt # Patient is able to verify HIPAA, DOB and address [facility] Care Management RN reviewed and addressed red alert with patient   EMMI:  April Lowery confirms that the EMMI answer is correct  She has felt sad about her medical and social concerns recently [facility] RN CM discussed and offered [facility] SW services  April Lowery does not prefer [facility] as this time  She confirms wonderful support from her daughter in law who spends time with her to assist with allowing her to ventilate, process through her experiences and cheers her up [facility] RN CM encouraged April Lowery to outreach at any time that the services may be needed  She voiced sincere appreciation for the EMMI red alert follow up outreach call   Recent cerebrovascular accident (CVA) April Lowery confirms she continue to have numbness on her entire right side of her body She is encouraged that she starts outpatient therapy on Tuesday 10/26/19 in [place]     Social: April Lowery is a 74 year old retired female who lives with son [name] and daughter in Sports coach. She confirms they provide wonderful care and support for her. She needs assist with all care needs and transportation at this time. She reports her driver's privileges has been taken away for now. She also lost her husband in 2020 and three months after she lost her husband she lost a son.  Her daughter in law is working with her on physical and mental    Consent: [facility] RN CM reviewed [facility] services with patient. Patient gave verbal consent for services [facility] telephonic RN CM.   Advised patient that there will be further automated EMMI- post discharge calls to assess how the patient is doing following the recent  hospitalization Advised the patient that another call may be received from a nurse if any of their responses were abnormal. Patient voiced understanding and was appreciative of f/u call.   Plan: [facility] RN CM will follow up with patient within the next 30 days  April Blaize L. Lavina Hamman, RN, BSN, [place] Coordinator Office number [phone] Mobile number [phone]  Main [facility] number [phone] Fax number [phone]

## 2019-10-24 ENCOUNTER — Encounter: Payer: Self-pay | Admitting: Family Medicine

## 2019-10-25 ENCOUNTER — Telehealth: Payer: Self-pay

## 2019-10-25 NOTE — Telephone Encounter (Signed)
Patient sent a message to let you know that her side is still numb, but now when she is walking, she'll pain in her foot. She would like to know if this is something she should worry about?

## 2019-10-27 NOTE — Telephone Encounter (Signed)
As far as she does not have a new neurologic deficit (numbness getting worse or weakness) I do not think it is a concern. It is not unusual for pts who have CVA to have pain in affected area. Thanks, BJ

## 2019-10-27 NOTE — Telephone Encounter (Signed)
See my chart message

## 2019-11-04 ENCOUNTER — Encounter: Payer: Self-pay | Admitting: *Deleted

## 2019-11-04 ENCOUNTER — Other Ambulatory Visit: Payer: Self-pay

## 2019-11-04 ENCOUNTER — Other Ambulatory Visit: Payer: Self-pay | Admitting: *Deleted

## 2019-11-04 NOTE — Patient Outreach (Signed)
[facility]) Care Management  11/04/2019  April Lowery 1945/10/06 [id number]   [facility] outreach for follow to [facility]- stroke referred patient  April Lowery was referred to [facility] on 10/21/19 for RED ON [place] Day # 1 Date: Wednesday 10/20/19 Red Alert Reason: Loss of interest, feeling sad? yes The EMMI was resolved on 7/29/21[facility] SW services and she did not prefer [facility] SW referral   Insurance: [employer] and bankers supplement/[facility]ial [facility] admissions x  1 ED visits x in the last 6 months  Last admission 10/06/19 to 10/08/19 for stroke  Outreach attempt #2 successful  Patient is able to verify HIPAA, DOB and address April Lowery is very pleasant and jovial today as she was also on 10/21/19  [facility] Care Management RN reviewed the reason for follow upwith patient   [facility] follow up EMMI and start of complex care services  Recent cerebrovascular accident (CVA) April Fullard confirms her hospital follow up with Dr Martinique  April Lowery confirms she continue to have numbness on her entire right side of her body She started outpatient therapy on Tuesday 10/26/19 [place]  She reports she is doing well except some soreness and she has lost 6 lbs  She shares that her sister is coming to visit for a week ans she still has wonderful support of her son and daughter in law who encourages her to continue to be active, to walk, socialize etc She continues the intake of her Zoloft 50 mg daily  Hypertension (HTN) She has noted with home monitoring that her BP was recently 135/80 She confirms this is generally high for her She reports generally her BP ranges 112-102/60-80 [facility] RN CM discussed reasons for elevations in BP to include increase sodium, activity/pain, alcohol, increase in weight She denies these possible reason and recalls her lisinopril was recently discontinued for low blood pressure (BP) values She was encouraged to document BP values for 3-4 days and to call pcp  office to review with MD nurse to see if she may need an adjustment in her medication, especially if the BP continues to be elevated when not in outpatient therapy Discussed the recommended < 130/80 for patients with Diabetes and HTN No noted dizzy spells, except once in PT but she told the therapist and rested No nosebleeds, no blurred vision, no chest pain Denies noted concerns with her pacemaker   Diabetes cbg values recently have been 101-120  She checks every morning only She confirms a weight loss of 6 lbs ? 173 lbs She reports she had motivation from her "[place]" daughter in law as she cleaned out her closet and discussed shopping for new clothes   Social: April Lowery is a 74 year old retired female who lives with son [name] and daughter in law She lives in the lower level of their home. She confirms they provide wonderful care and support for her. She needs assist with all care needs and transportation at this time. She reports her driver's privileges has been taken away for now. She also lost her husband in 2020 and three months after she lost her husband she lost a son.  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), Hypertension (HTN), Hyperlipidemia (HLD), GERD (gastroesophageal reflux disease) intermittent complete heart block, other disorders of kidney and ureter, hx of cerebellar stroke, anxiety disorder, history of renal cell carcinoma, B12 deficiency, morbid obesity (10/18/19 wt =179 lbs) BMI 29.79  covid vaccines 07/27/19, 07/05/19 No preference for flu vaccine  DME 4 wheeled walker with seat, BP cuff cbg monitor  appointments reviewed  11/10/19 J McCue  neurology 12/07/19 cardiology 01/18/20 pcp Dr Betty Martinique 03/07/20 cardiology 03/31/20 Dr Betty Martinique  Plan [facility] RN CM will follow up within the next 14-21 business days [facility] RN CM sent a Welcome outreach letter with [facility] brochure, [employer], [facility] consent form with return envelope and know before you go sheet enclosed for review  Pt encouraged to return a call to [facility] RN CM prn- pt given [facility] RN CM office number and 24 hour nurse call center number Routed note to MD MD involvement barriers letter sent   Goals Addressed              This Visit's Progress     Patient Stated   .  Patient will be able to verbalize management of stroke, diabetes and hypertension at home (pt-stated)        CARE PLAN ENTRY (see longtitudinal plan of care for additional care plan information)  Objective:  . Last practice recorded BP readings:  BP Readings from Last 3 Encounters:  10/18/19 (!) 120/62  10/08/19 130/60  10/04/19 126/81 .   Marland Kitchen Most recent eGFR/CrCl: No results found for: EGFR  No components found for: CRCL  Current Barriers:  Marland Kitchen Knowledge deficit related to self care management of hypertension . Cognitive Deficits  Case Manager Clinical Goal(s):  Marland Kitchen Over the next 31 days, patient will not experience hospital admission. Hospital Admissions in last 6 months = 1  . Over the next 90 days, patient will verbalize basic understanding of hypertension disease process and self health management plan as evidenced by BP < 130/80   Interventions:  . Evaluation of current treatment plan related to hypertension self management and patient's adherence to plan as established by provider. . Reviewed medications with patient and discussed importance of compliance . Discussed plans with patient for ongoing care management follow up and provided patient with direct contact information for care management team . Advised patient, providing education and rationale, to monitor blood pressure daily and record, calling  PCP for findings outside established parameters.  . Reviewed scheduled/upcoming provider appointments including:  . Provided education regarding s/s of stroke and stroke prevention . Discussed signs and symptoms of hypertension . Provided educational material:  EMMI (Weight Loss Tips, Weight Loss Diet, Chair Exercises, Relaxation Techniques, Low Sodium Diet, DASH Diet, Controlling your Blood Pressure through Lifestyle, High Blood Pressure Health Problems, High Blood Pressure Taking Your Blood Pressure, Lowering Your Rick of High Blood Pressure, Stress, etc)  Patient Self Care Activities:  . Self administers medications as prescribed . Attends all scheduled provider appointments . Calls provider office for new concerns, questions, or BP outside discussed parameters . Monitors BP and records as discussed . Adheres to a low sodium diet/DASH diet . Increase physical activity as tolerated . Verbalize where to go to receive medical care  Initial goal documentation Please see other previous [facility] care plan information listed in Epic under the flow sheet section          Shemica Meath L. Lavina Hamman, RN, BSN, Love Coordinator Office number [phone]  Mobile number [phone]  Main [facility] number [phone] Fax number [phone]

## 2019-11-10 ENCOUNTER — Encounter: Payer: Self-pay | Admitting: Adult Health

## 2019-11-10 ENCOUNTER — Other Ambulatory Visit: Payer: Self-pay

## 2019-11-10 ENCOUNTER — Ambulatory Visit (INDEPENDENT_AMBULATORY_CARE_PROVIDER_SITE_OTHER): Payer: Medicare Other | Admitting: Adult Health

## 2019-11-10 VITALS — BP 133/64 | HR 60 | Ht 65.0 in | Wt 178.0 lb

## 2019-11-10 DIAGNOSIS — E785 Hyperlipidemia, unspecified: Secondary | ICD-10-CM

## 2019-11-10 DIAGNOSIS — I6503 Occlusion and stenosis of bilateral vertebral arteries: Secondary | ICD-10-CM | POA: Diagnosis not present

## 2019-11-10 DIAGNOSIS — I6523 Occlusion and stenosis of bilateral carotid arteries: Secondary | ICD-10-CM | POA: Diagnosis not present

## 2019-11-10 DIAGNOSIS — I10 Essential (primary) hypertension: Secondary | ICD-10-CM

## 2019-11-10 DIAGNOSIS — E119 Type 2 diabetes mellitus without complications: Secondary | ICD-10-CM

## 2019-11-10 DIAGNOSIS — I639 Cerebral infarction, unspecified: Secondary | ICD-10-CM

## 2019-11-10 NOTE — Patient Instructions (Addendum)
Continue working with therapy for ongoing improvement - if you would like to do neuro rehab therapy, please let us know for additional orders   Continue aspirin 325 mg daily and clopidogrel 75 mg daily  and Crestor and Zetia for secondary stroke prevention  Please continue current aspirin and Plavix regimen for additional 2 months and after that time, you can decrease aspirin back to 81 mg daily and continue Plavix  Continue to follow up with PCP regarding cholesterol, blood pressure and diabetes management  Maintain strict control of hypertension with blood pressure goal below 130/90, diabetes with hemoglobin A1c goal below 7.0% and cholesterol with LDL cholesterol (bad cholesterol) goal below 70 mg/dL.      Followup in the future with me in 3 months or call earlier if needed       Thank you for coming to see [place] at [facility]. I hope we have been able to provide you high quality care today.  You may receive a patient satisfaction survey over the next few weeks. We would appreciate your feedback and comments so that we may continue to improve ourselves and the health of our patients.    Sensory Loss After a Stroke A stroke can damage parts of your brain that control your body's normal functions, including your senses. As a result, you may have sensory loss, such as trouble seeing, tasting, swallowing, or feeling touch or pressure sensations. You may have problems feeling temperature changes or moving your body in a coordinated way. You may also perceive smell differently. You may have problems with all of your senses or only some of them. By following a treatment plan, you may recover lost senses and manage the changes to your lifestyle. What are treatment therapies for sensory loss? You may have a combination of therapies for sensory loss.  Physical therapy. This may include: ? Exercises to improve your coordination and balance. ? Exercises that combine touch,  balance, and movement (sensorimotor training). ? Movements to relieve pressure while you are sitting or lying (mobility training). ? Splints or braces to protect parts of your body that you cannot feel.  Devices to help with blood flow (circulation) and to help stimulate nerves in affected parts of your body.  Speech therapy to help you swallow safely.  Occupational therapy to help you with everyday tasks. This may include: ? Exercises or devices to improve your vision. ? Exercises to help you increase your touch perception. These may include feeling objects of different sizes and textures while your eyes are closed. ? Techniques for moving safely in your environment.  Prescription eye glasses for vision loss.  Hearing aids for hearing loss. Follow these instructions at home: Safety   Your risk of falling is higher after a stroke. You may have difficulty feeling your legs and feet or coordinating your movements. To lower your risk of falling: ? Use devices to help you move around (assistive devices), such as a wheelchair or walker, as directed by your health care team. ? Wear prescription eye glasses at all times when moving around. ? Use lights to help you see in the dark. ? Use grab bars in bathrooms and handrails in stairways. ? Keep walkways clear in your home by removing rugs, cords, and clutter from the floor.  Your risk of getting burned is higher after a stroke. To lower your risk of burns: ? Test the water temperature before taking a bath or washing your hands. ? Allow hot foods to cool  slightly before eating. ? Use potholders when handling hot pans.  When using sharp objects, such as scissors or knives, use your healthy hand. Do not handle sharp objects with your hand that was affected by your stroke, if this applies. Activity  Return to your normal activities as told by your health care provider. Ask your health care provider what activities are safe for you.  Avoid  spending too much time sitting or lying down. If you must be in a chair or bed, change positions regularly.  You may have problems doing your normal activities. Ask your health care provider about getting extra help at home. Eating and drinking   You may have problems swallowing food and fluids after a stroke. The problems can be due to: ? Changes in your muscles. ? Sensory changes, such as:  Difficulty feeling the consistency or size of a piece of food in your mouth.  Inability to feel the need to clear your throat.  You may need to: ? Take smaller bites and chew thoroughly. Make sure you have swallowed all the food in your mouth before you take another bite. ? Sit in an upright position when eating or drinking. ? Avoid distractions while eating or drinking. ? Stay upright for 30-45 minutes after eating. ? Change the texture of some things that you eat and drink. This may include:  Changing foods to a smooth, mashed consistency (puree).  Thickening liquids.  Follow instructions from your health care provider about eating and drinking restrictions. General instructions  Wear arm or leg braces as told by your health care team.  Get help at home as needed. You may need help getting dressed, bathing, using the bathroom, eating, or doing other activities.  Keep all follow-up visits as told by your health care providers. This is important. Summary  It is common to have sensory loss after a stroke. Sensory loss means that you have problems with some or all of your senses, such as vision, taste, hearing, smell, and touch.  Sensory loss happens because of damage to your brain and nervous system after a stroke.  Treatment for sensory loss may include physical, occupational, or speech therapy, and the use of assistive devices.  You may need to make changes to your home and lifestyle after a stroke to help you live safely and independently. This information is not intended to replace  advice given to you by your health care provider. Make sure you discuss any questions you have with your health care provider. Document Revised: 07/03/2018 Document Reviewed: 06/28/2016 Elsevier Patient Education  [place].    Stroke Prevention Some medical conditions and behaviors are associated with a higher chance of having a stroke. You can help prevent a stroke by making nutrition, lifestyle, and other changes, including managing any medical conditions you may have. What nutrition changes can be made?   Eat healthy foods. You can do this by: ? Choosing foods high in fiber, such as fresh fruits and vegetables and whole grains. ? Eating at least 5 or more servings of fruits and vegetables a day. Try to fill half of your plate at each meal with fruits and vegetables. ? Choosing lean protein foods, such as lean cuts of meat, poultry without skin, fish, tofu, beans, and nuts. ? Eating low-fat dairy products. ? Avoiding foods that are high in salt (sodium). This can help lower blood pressure. ? Avoiding foods that have saturated fat, trans fat, and cholesterol. This can help prevent high cholesterol. ?  Avoiding processed and premade foods.  Follow your health care provider's specific guidelines for losing weight, controlling high blood pressure (hypertension), lowering high cholesterol, and managing diabetes. These may include: ? Reducing your daily calorie intake. ? Limiting your daily sodium intake to 1,500 milligrams (mg). ? Using only healthy fats for cooking, such as olive oil, canola oil, or sunflower oil. ? Counting your daily carbohydrate intake. What lifestyle changes can be made?  Maintain a healthy weight. Talk to your health care provider about your ideal weight.  Get at least 30 minutes of moderate physical activity at least 5 days a week. Moderate activity includes brisk walking, biking, and swimming.  Do not use any products that contain nicotine or tobacco,  such as cigarettes and e-cigarettes. If you need help quitting, ask your health care provider. It may also be helpful to avoid exposure to secondhand smoke.  Limit alcohol intake to no more than 1 drink a day for nonpregnant women and 2 drinks a day for men. One drink equals 12 oz of beer, 5 oz of wine, or 1 oz of hard liquor.  Stop any illegal drug use.  Avoid taking birth control pills. Talk to your health care provider about the risks of taking birth control pills if: ? You are over 60 years old. ? You smoke. ? You get migraines. ? You have ever had a blood clot. What other changes can be made?  Manage your cholesterol levels. ? Eating a healthy diet is important for preventing high cholesterol. If cholesterol cannot be managed through diet alone, you may also need to take medicines. ? Take any prescribed medicines to control your cholesterol as told by your health care provider.  Manage your diabetes. ? Eating a healthy diet and exercising regularly are important parts of managing your blood sugar. If your blood sugar cannot be managed through diet and exercise, you may need to take medicines. ? Take any prescribed medicines to control your diabetes as told by your health care provider.  Control your hypertension. ? To reduce your risk of stroke, try to keep your blood pressure below 130/80. ? Eating a healthy diet and exercising regularly are an important part of controlling your blood pressure. If your blood pressure cannot be managed through diet and exercise, you may need to take medicines. ? Take any prescribed medicines to control hypertension as told by your health care provider. ? Ask your health care provider if you should monitor your blood pressure at home. ? Have your blood pressure checked every year, even if your blood pressure is normal. Blood pressure increases with age and some medical conditions.  Get evaluated for sleep disorders (sleep apnea). Talk to your health  care provider about getting a sleep evaluation if you snore a lot or have excessive sleepiness.  Take over-the-counter and prescription medicines only as told by your health care provider. Aspirin or blood thinners (antiplatelets or anticoagulants) may be recommended to reduce your risk of forming blood clots that can lead to stroke.  Make sure that any other medical conditions you have, such as atrial fibrillation or atherosclerosis, are managed. What are the warning signs of a stroke? The warning signs of a stroke can be easily remembered as BEFAST.  B is for balance. Signs include: ? Dizziness. ? Loss of balance or coordination. ? Sudden trouble walking.  E is for eyes. Signs include: ? A sudden change in vision. ? Trouble seeing.  F is for face. Signs include: ? Sudden  weakness or numbness of the face. ? The face or eyelid drooping to one side.  A is for arms. Signs include: ? Sudden weakness or numbness of the arm, usually on one side of the body.  S is for speech. Signs include: ? Trouble speaking (aphasia). ? Trouble understanding.  T is for time. ? These symptoms may represent a serious problem that is an emergency. Do not wait to see if the symptoms will go away. Get medical help right away. Call your local emergency services (911 in the [place] Do not drive yourself to the hospital.  Other signs of stroke may include: ? A sudden, severe headache with no known cause. ? Nausea or vomiting. ? Seizure. Where to find more information For more information, visit:  American Stroke Association: www.strokeassociation.org  National Stroke Association: www.stroke.org Summary  You can prevent a stroke by eating healthy, exercising, not smoking, limiting alcohol intake, and managing any medical conditions you may have.  Do not use any products that contain nicotine or tobacco, such as cigarettes and e-cigarettes. If you need help quitting, ask your health care provider. It  may also be helpful to avoid exposure to secondhand smoke.  Remember BEFAST for warning signs of stroke. Get help right away if you or a loved one has any of these signs. This information is not intended to replace advice given to you by your health care provider. Make sure you discuss any questions you have with your health care provider. Document Revised: 02/21/2017 Document Reviewed: 04/16/2016 Elsevier Patient Education  2020 [employer].

## 2019-11-10 NOTE — Progress Notes (Signed)
[facility] [address] [place]. [place] [address] [phone]       HOSPITAL FOLLOW UP NOTE  [name] Date of Birth:  1945/08/07 Medical Record Number:  [id number]   Reason for Referral:  hospital stroke follow up    SUBJECTIVE:   CHIEF COMPLAINT:  Chief Complaint  Patient presents with  . Hospitalization Follow-up    HFU for CVA. Still having issues with pain and numbness on right side. Still a little unsteady on feet.   . room 5    with sister     HPI:   [name] is a 74 y.o. female w/pmh of PE, HTN, HLD, DVT, CAD who presented on 10/06/2019 with gait instability and decreased sensation to the right leg and right arm.  Presented 2 days prior with dizziness with MRI negative and discharged home.  Stroke work-up revealed left superior medulla and cerebellar stroke likely secondary to large vessel disease in setting of left VA occlusion.  CTA head/neck also showed right VA mild to moderate stenosis, left ICA 50% stenosis and bilateral siphon moderate arthrosclerosis.  2D echo EF 40 to 45%.  PTA aspirin 81 mg daily and Plavix for history of CAD and recommended increasing aspirin to 325 mg daily in addition to Plavix for 3 months then PTA dosing.  History of HTN stable.  History of HLD with LDL 71 recommend continuation of Crestor 20 mg daily.  No prior history of DM with new diagnosis of DM during admission with A1c 7.0. 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  Evaluated by therapy and recommended PT and discharged home in stable condition.  Stroke: Left superior medulla and opposing inferior cerebellum -CT head negative -MR brain wo contrast: 2 punctate acute infarcts of the dorsal left superior medulla and opposing inferior cerebellum; small chronic left cerebellar infarcts -CTA head/neck: No LVO; arthrosclerotic disease and moderate stenosis in the  cavernous carotid bilaterally, 25% diameter stenosis proximal right ICA and 50% diameter stenosis proximal left ICA, mild to moderate stenosis distal right vertebral artery, occlusion distal left vertebral artery at skull base -2D echo: EF 40 to 45% -A1c 7.0 -LDL 71   Today, 11/10/2019, April Lowery is being seen for hospital follow-up accompanied by her sister  Residual deficits right-sided numbness with sensory impairment, gait instability and occasional vertigo She does report great improvement since discharge.  Recently started working with PT in [place].  Ambulating without assistive device for short distance but use of rolling walker for long distance She questions return to driving as well as going on planned trip to [place] in October  Continues on aspirin 325 mg daily and Plavix 75 mg daily without bleeding or bruising Continues on Crestor 40 mg daily and Zetia without side effects Blood pressure today satisfactory at 133/64 Continues to follow with PCP for HTN, HLD and DM management  No further concerns at this time       ROS:   14 system review of systems performed and negative with exception of numbness/tingling, decreased sensation, imbalance and dizziness  PMH:  Past Medical History:  Diagnosis Date  . Acute ST segment elevation myocardial infarction ([facility])   . Arthritis   . CAD (coronary artery disease) 02/15/2019  . Cancer of left kidney ([place])   . Complete atrioventricular block ([facility])   . DVT (deep venous thrombosis) ([place]) 02/15/2019  . GI bleeding   .  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 HLD (hyperlipidemia) 02/15/2019  . HTN (hypertension) 02/15/2019  . Ischemic cardiomyopathy   . Melena   . Pacemaker   . Pulmonary thromboembolism ([place]) 02/15/2019    PSH:  Past Surgical History:  Procedure Laterality Date  . CHOLECYSTECTOMY    . CORONARY ARTERY BYPASS GRAFT     STENTS  . PACEMAKER INSERTION    . TONSILLECTOMY AND ADENOIDECTOMY  2017    Social  History:  Social History   Socioeconomic History  . Marital status: Widowed    Spouse name: Not on file  . Number of children: 4  . Years of education: Not on file  . Highest education level: Not on file  Occupational History  . Occupation: retired  Tobacco Use  . Smoking status: Former Research scientist (life sciences)  . Smokeless tobacco: Never Used  Vaping Use  . Vaping Use: Never used  Substance and Sexual Activity  . Alcohol use: Never  . Drug use: Never  . Sexual activity: Not on file    Comment: MARRIED  Other Topics Concern  . Not on file  Social History Narrative  . Not on file   Social Determinants of Health   Financial Resource Strain: Low Risk   . Difficulty of Paying Living Expenses: Not hard at all  Food Insecurity: No Food Insecurity  . Worried About Charity fundraiser in the Last Year: Never true  . Ran Out of Food in the Last Year: Never true  Transportation Needs: No Transportation Needs  . Lack of Transportation (Medical): No  . Lack of Transportation (Non-Medical): No  Physical Activity: Sufficiently Active  . Days of Exercise per Week: 5 days  . Minutes of Exercise per Session: 30 min  Stress: No Stress Concern Present  . Feeling of Stress : Not at all  Social Connections: Moderately Isolated  . Frequency of Communication with Friends and Family: More than three times a week  . Frequency of Social Gatherings with Friends and Family: More than three times a week  . Attends Religious Services: Never  . Active Member of Clubs or Organizations: Yes  . Attends Archivist Meetings: Never  . Marital Status: Widowed  Intimate Partner Violence: Not At Risk  . Fear of Current or Ex-Partner: No  . Emotionally Abused: No  . Physically Abused: No  . Sexually Abused: No    Family History:  Family History  Problem Relation Age of Onset  . Heart disease Mother   . Heart attack Mother   . Heart disease Father   . Heart attack Father   . Heart disease Brother   .  Heart attack Brother   . Heart attack Maternal Grandmother   . Heart attack Maternal Grandfather   . Heart attack Paternal Grandmother   . Heart attack Paternal Grandfather     Medications:   Current Outpatient Medications on File Prior to Visit  Medication Sig Dispense Refill  . acetaminophen (TYLENOL) 650 MG CR tablet Take 650 mg by mouth every 8 (eight) hours as needed for pain.    Marland Kitchen aspirin EC 325 MG EC tablet Take 1 tablet (325 mg total) by mouth daily. 90 tablet 0  . cholecalciferol (VITAMIN D3) 25 MCG (1000 UT) tablet Take 1,000 Units by mouth daily.    . clopidogrel (PLAVIX) 75 MG tablet Take 1 tablet (75 mg total) by mouth daily. 30 tablet 11  . Cyanocobalamin (B-12) 2500 MCG TABS Take 1 tablet by mouth daily.    Marland Kitchen  ezetimibe (ZETIA) 10 MG tablet Take 1 tablet (10 mg total) by mouth daily. 30 tablet 11  . metoprolol succinate (TOPROL-XL) 50 MG 24 hr tablet Take 1 tablet (50 mg total) by mouth daily. Take with or immediately following a meal. 30 tablet 11  . omeprazole (PRILOSEC) 20 MG capsule Take 20 mg by mouth as needed (acid reflux).     . rosuvastatin (CRESTOR) 40 MG tablet Take 1 tablet (40 mg total) by mouth daily. (Patient taking differently: Take 40 mg by mouth at bedtime. ) 30 tablet 11  . sertraline (ZOLOFT) 50 MG tablet Take 1 tablet (50 mg total) by mouth daily. 90 tablet 1   No current facility-administered medications on file prior to visit.    Allergies:   Allergies  Allergen Reactions  . Penicillins Rash      OBJECTIVE:  Physical Exam  Vitals:   11/10/19 0758  BP: 133/64  Pulse: 60  Weight: 178 lb (80.7 kg)  Height: 5\' 5"  (1.651 m)   Body mass index is 29.62 kg/m. No exam data present  General: well developed, well nourished,  pleasant elderly Caucasian female, seated, in no evident distress Head: head normocephalic and atraumatic.   Neck: supple with no carotid or supraclavicular bruits Cardiovascular: regular rate and rhythm, no  murmurs Musculoskeletal: no deformity Skin:  no rash/petichiae Vascular:  Normal pulses all extremities   Neurologic Exam Mental Status: Awake and fully alert.   Fluent speech and language.  Oriented to place and time. Recent and remote memory intact. Attention span, concentration and fund of knowledge appropriate. Mood and affect appropriate.  Cranial Nerves: Fundoscopic exam reveals sharp disc margins. Pupils equal, briskly reactive to light. Extraocular movements full without nystagmus. Visual fields full to confrontation. Hearing intact. Facial sensation intact. Face, tongue, palate moves normally and symmetrically.  Motor: Normal bulk and tone. Normal strength in all tested extremity muscles. Sensory.:  Decreased sensory right upper and lower extremity temperature, vibration and pinprick Coordination: Rapid alternating movements normal in all extremities. Finger-to-nose and heel-to-shin mild right-sided ataxia. Gait and Station: Arises from chair without difficulty. Stance is normal. Gait demonstrates  ataxic gait with mild imbalance.  No use of assistive device.  Difficulty performing tandem walk and standing on single leg alone.  Romberg negative. Reflexes: 1+ and symmetric. Toes downgoing.     NIHSS  4 Modified Rankin  2      ASSESSMENT: NACHELLE NEGRETTE is a 74 y.o. year old female presented with dizziness, blurred vision and right-sided weakness/numbness on 10/04/2019 with initial MRI negative but then returned with reonset of right-sided numbness on 10/06/2019 with repeat MRI left superior medulla and cerebellar stroke likely secondary to large vessel disease with CTA showing left VA occlusion and mild to moderate right VA stenosis. Vascular risk factors include large vessel stenosis, chronic left cerebellar infarct on imaging, HTN, HLD, new onset DM, history of PE/DVT, CAD s/p stent and CHF s/p pacemaker.      PLAN:  1. Left medulla and cerebellar stroke:  a. Residual deficit:   i. Right hemisensory impairment, ataxia, gait impairment, imbalance and occasional vertigo.   ii. Encouraged continued participation in PT and consider OT if needed.   iii. Due to residual deficits, do not recommend return to driving at this time until further improvement.   iv. Would not recommend traveling to [place] as currently planned in October as she will be 3 months post stroke and concern for long flight duration and traveling to remote area.  Letter provided.   b. Continue aspirin 325 mg daily and clopidogrel 75 mg daily  and Crestor 40 mg daily and Zetia for secondary stroke prevention. c. Continue DAPT at current dose for additional 2 months and then decrease aspirin back to 81 mg daily and continue on Plavix for secondary stroke prevention and history of CAD d. Close PCP follow up for aggressive stroke risk factor management  2. Extracranial stenosis:  a. Continue DAPT with dosage adjustment in 2 months as well as ongoing use of statin and importance of aggressive stroke risk factor management b. Surveillance monitoring of carotids which will be completed at follow-up visit or managed by cardiology 3. HTN:  a. BP goal <130/90.   b. Stable.   c. Continue f/u with PCP 4. HLD:  a. LDL goal <70. Recent LDL 71.   b. Continue Crestor 40 mg daily and Zetia.   c. F/u with PCP for management as well as prescribing of statin 5. New dx DMII:  a. A1c goal<7.0. Recent A1c 7.1.   b. Currently diet controlled.   c. F/u with PCP    Follow up in 3 months or call earlier if needed   I spent 50 minutes of face-to-face and non-face-to-face time with patient and sister.  This included previsit chart review, lab review, study review, order entry, electronic health record documentation, patient education regarding recent stroke, residual deficits, importance of managing stroke risk factors and answered all questions to patient satisfaction     Frann Rider, [facility]  [facility] [address] [place], [place] [address]  Phone [phone] Fax [phone] Note: This document was prepared with digital dictation and possible smart phrase technology. Any transcriptional errors that result from this process are unintentional.

## 2019-11-12 ENCOUNTER — Other Ambulatory Visit: Payer: Self-pay | Admitting: Internal Medicine

## 2019-11-12 NOTE — Progress Notes (Signed)
I agree with the above plan 

## 2019-11-14 ENCOUNTER — Encounter: Payer: Self-pay | Admitting: Adult Health

## 2019-11-18 ENCOUNTER — Other Ambulatory Visit: Payer: Self-pay | Admitting: *Deleted

## 2019-11-18 ENCOUNTER — Other Ambulatory Visit: Payer: Self-pay

## 2019-11-18 NOTE — Patient Outreach (Addendum)
[facility]) Care Management  11/18/2019  April Lowery August 30, 1945 [id number]   [facility] outreach for follow to [facility]- stroke referred patient  April Lowery was referred to [facility] on 10/21/19 for RED ON [place] Day # 1 Date: Wednesday 10/20/19 Red Alert Reason: Loss of interest, feeling sad? yes The EMMI was resolved on 7/29/21[facility] SW services and she did not prefer [facility] SW referral   Insurance: [employer] Medicareand bankers supplement/[facility] x1ED visits x in the last 6 months  Last admission 10/06/19 to 10/08/19 for stroke/TIA   Outreach attempt #3 successful  Patient is able to verify HIPAA, DOB and address  [facility] RN reviewed the reason for follow upwith patient- BP elevations,therapies, preventive care    Follow up, preventive care assessment  April Lowery informs [facility] RN CM of the good news of her being "Released from therapy" today She was commended for all her hard work and improvements She confirms she had a good visit with sister and family from out of state recently and it "boosted morale" She reports her Hypertension (HTN) and diabetes are manageable since the last outreach She denies further BP elevations She continues monitoring at home She reports her diastolic BP has ranged in the 60-70's No changes in HTN medications She reports no further lose of weight at this time  [facility] RN CM provided encouragement She reports she continues to improve with diet and mobility  Preventive care  Reviewed stroke BE FAST (Balance, blurred, Double vision or loss vision of Eyes, Facial drooping, Arm weakness, Speech difficulties and Time) She confirms she is aware she had 2-3  transient ischemic attacks (TIAs). She reports with the last admission she had awaken from her sleep when her symptoms occurred and only noted lightheadedness or off balance. Son noted slurring words  She reports the MRI and CT confirmed her diagnosis as even the EMS  staff were not able to identify she had TIA/stroke symptoms  Previous hx of light headed and dizzy she did the FAST checks and states she was not able to identify the reportable stroke symptoms She has information on BE Fast and is reporting she is checking daily now     Diabetes eye exam last done in 02/2018 "didn't have time" in 2020 related to social issues with the death of her husband and son She reports her "feet are fine" as she gets pedicures monthly and denies any neuropathy symptoms cbg values in the last weeks have remain in the 100's  Hearing  Was check before she moved to [name] in 12/2018  She records her MD notifying her she "will need assistance in the future" She reports she "has a read out I will give to Dr Lowery the next time I see her"  She reports in [place] she was seeing her cardiologist every 6 months and pcp q 3 months  Present identified need  "Travel" needs She confirms she is not allowed to drive at this time and her son and his wife will need to go to [name] to check on her ill family members soon [facility] RN CM discussed [place] count senior services to include [facility] ([place] Transportation [facility] RN CM provided [place] office number as  [phone]  Trips included in this limit are "in-town" or "in-county", doctor's appointments, grocery or drug store shopping trips, visitation, etc.   Social:April Lowery is a 74 year old retired female who lives with son April Lowery  and daughter in law She lives in the lower level of their home. She confirms they provide wonderful care and support for her. She recently moved from [place] to [facility] in October 2020.  She also lost her husband in 2020 and three months after she lost her husband she lost a son.   She needs assist with all care needs and transportation at this time. She reports her driver's privileges has been taken away for now.  She has a sister that is supportive and visits She has 4 children Her  daughter in law is working with her on physical and mental  Conditions Stroke, small chronic left cerebellar infarcts right side weakness CAD with multiple stents, remote history of deep vein thrombosis (DVT) and  pulmonary embolism (PE), ischemic cardiomyopathy status post ICD and pacemaker (PPM), Hypertension (HTN), Hyperlipidemia (HLD), GERD (gastroesophageal reflux disease) intermittent complete heart block, other disorders of kidney and ureter, hx of cerebellar stroke, anxiety disorder, history of renal cell carcinoma, B12 deficiency, morbid obesity (10/18/19 wt =179 lbs) BMI 29.79  covid vaccines 07/27/19, 07/05/19 No preference for flu vaccine  DME 4 wheeled walker with seat, BP cuff cbg monitor  appointments reviewed 12/07/19 cardiology 01/18/20 pcp Dr April Lowery 03/07/20 cardiology 03/31/20 Dr April Lowery  Plan [facility] RN CM will follow up within the next 14-21 business days Pt encouraged to return a call to [facility] RN CM prn Routed note to MD Goals      Patient Stated   .  Patient will be able to verbalize management of stroke, diabetes and hypertension at home (pt-stated)      CARE PLAN ENTRY (see longtitudinal plan of care for additional care plan information)  Objective:  . Last practice recorded BP readings:  BP Readings from Last 3 Encounters:  10/18/19 (!) 120/62  10/08/19 130/60  10/04/19 126/81 .   Marland Kitchen Most recent eGFR/CrCl: No results found for: EGFR  No components found for: CRCL  Current Barriers:  Marland Kitchen Knowledge deficit related to self care management of hypertension . Cognitive Deficits  Case Manager Clinical Goal(s):  Marland Kitchen Over the next 31 days, patient will not experience hospital admission. Hospital Admissions in last 6 months = 1 11/18/19 met last hospital discharge 10/13/19 . Over the next 90 days, patient will verbalize basic understanding of hypertension disease process and self health management plan as evidenced by BP < 130/80 progressing DBP in 60-70's per pt     Interventions:  . Evaluation of current treatment plan related to hypertension self management and patient's adherence to plan as established by provider. . Reviewed medications with patient and discussed importance of compliance . Discussed plans with patient for ongoing care management follow up and provided patient with direct contact information for care management team . Advised patient, providing education and rationale, to monitor blood pressure daily and record, calling PCP for findings outside established parameters.  . Reviewed scheduled/upcoming provider appointments including:  . Provided education regarding s/s of stroke and stroke prevention . Discussed signs and symptoms of hypertension . Provided educational material:  EMMI (Weight Loss Tips, Weight Loss Diet, Chair Exercises, Relaxation Techniques, Low Sodium Diet, DASH Diet, Controlling your Blood Pressure through Lifestyle, High Blood Pressure Health Problems, High Blood Pressure Taking Your Blood Pressure, Lowering Your Rick of High Blood Pressure, Stress, etc) . Reviewed preventive care and BE FAST . Sent via mail [employer] for transportation, support groups and other services    Patient Self Care Activities:  . Self administers medications  as prescribed . Attends all scheduled provider appointments . Calls provider office for new concerns, questions, or BP outside discussed parameters . Monitors BP and records as discussed . Adheres to a low sodium diet/DASH diet . Increase physical activity as tolerated . Verbalize where to go to receive medical care  Initial goal documentation Please see other previous [facility] care plan information listed in Epic under the flow sheet section         Other   .  Exercise 3x per week (30 min per time)      Low impact exercise 3-4 times per week. Ideal for you aquatic exercises.  Continue a low fat and low salt diet.       Dakarri Kessinger L. Lavina Hamman, RN, BSN, [facility] Coordinator Office number [phone] Mobile number [phone]  Main [facility] number [phone] Fax number [phone]

## 2019-11-24 ENCOUNTER — Encounter: Payer: Self-pay | Admitting: Adult Health

## 2019-11-24 NOTE — Telephone Encounter (Signed)
When previously seen, she had right-sided hemisensory impairment as well as ataxia and complaints of vertigo.  Concern for ability to adequately maneuver right leg quickly if needed to.  Would recommend driving evaluation prior to return to driving.  Can you please further assist with this?  Thank you.

## 2019-11-30 NOTE — Telephone Encounter (Signed)
Can we obtain records from therapy appointments regarding driving concerns and privileges.  Previously participating at [facility] therapy.  Thank you.

## 2019-12-01 ENCOUNTER — Other Ambulatory Visit: Payer: Self-pay

## 2019-12-01 ENCOUNTER — Other Ambulatory Visit: Payer: Self-pay | Admitting: *Deleted

## 2019-12-01 ENCOUNTER — Telehealth: Payer: Self-pay | Admitting: *Deleted

## 2019-12-01 NOTE — Telephone Encounter (Signed)
Received pT notes form [facility], placed on [place] desk for review.

## 2019-12-01 NOTE — Telephone Encounter (Signed)
Per Janett Billow, NP I messaged patient for name of PT facility. Patient wants to return to driving; NP wants to see PT notes. I called [facility], spoke with Brooks. Patient needs to sign release for notes to be faxed. I sent my chart to advise patient  and gave her RN pod fax #.

## 2019-12-01 NOTE — Patient Outreach (Signed)
[facility]) Care Management  12/01/2019  April Lowery 10/02/1945 [id number]   [facility] outreach for follow toEMMI- strokereferred patient  April Lowery was referred to [facility] on 10/21/19 forRED ON EMMI ALERT Day #1Date:Wednesday 7/28/21Red Alert Reason:Loss of interest, feeling sad? yes The EMMI was resolved on 7/29/21[facility] SW services and she did not prefer [facility] SW referral  Insurance: [employer] Medicareand bankers supplement/[facility]ial [facility] x1ED visits x in the last 6 months  Last admission 10/06/19 to 10/08/19 for stroke/TIA   Outreach attempt  successful Patient is able to verify HIPAA, DOB and address [place] Management RN reviewedthe reason for follow upwith patient- BP elevations,therapies, preventive care   Follow up - transportation/driving permission [facility] RN CM noted Epic notes indicating patient requesting permission to drive from neurology staff She reports she was informed she would need to go in to outpatient rehab for tests to confirm she is capable to drive She reports being informed that this therapy is about $289-300 She is not able to afford this  She also reports she has left messages for the local agencies for transportation without success in a return call or message  [facility] RN CM was given permission by patient to outreach to local transportation services  She denies any other needs today or medical concerns  She reports she is managing well at home    Social:April Lowery is a 74 year old retired female who lives with son April Lowery and daughter in law She lives in the lower level of their home. She confirms they provide wonderful care and support for her. She recently moved from [place] to [facility] in October 2020.  She also lost her husband in 2020 and three months after she lost her husband she lost a son.   She needs assist with all care needs and transportation at this time. She reports her driver's privileges has been  taken away for now. 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  [facility]) Patient will be able to verbalize management of stroke, diabetes and hypertension at home (pt-stated)        [place] (see longtitudinal plan of care for additional care plan information)  Objective:  . Last practice recorded BP readings:  BP Readings from Last 3 Encounters:  11/10/19 133/64  10/18/19 (!) 120/62  10/08/19 130/60 .   Marland Kitchen Most recent eGFR/CrCl: No results found for: EGFR  No components found for: CRCL  Current Barriers:  Marland Kitchen Knowledge deficit related to self care management of hypertension . Cognitive Deficits  Case Manager Clinical Goal(s):  Marland Kitchen Over the next 31 days, patient will not experience hospital admission. Hospital Admissions  in last 6 months = 1 11/18/19 met last hospital discharge 10/13/19 . Over the next 90 days, patient will verbalize basic understanding of hypertension disease process and self health management plan as evidenced by BP < 130/80 progressing DBP in 60-70's per pt   . Over the next 30 days patient will have access to local transportation or be  informed she has been released to drive per neurology  Interventions:  . Evaluation of current treatment plan related to hypertension self management and patient's adherence to plan as established by provider. . Reviewed medications with patient and discussed importance of compliance . Discussed plans with patient for ongoing care management follow up and provided patient with direct contact information for care management team . Advised patient, providing education and rationale, to monitor blood pressure daily and record, calling PCP for findings outside established parameters.  . Reviewed scheduled/upcoming provider appointments including:  . Provided education regarding s/s of stroke and stroke prevention . Discussed signs and symptoms of hypertension . Provided educational material:  EMMI (Weight Loss Tips, Weight Loss Diet, Chair Exercises, Relaxation Techniques, Low Sodium Diet, DASH Diet, Controlling your Blood Pressure through Lifestyle, High Blood Pressure Health Problems, High Blood Pressure Taking Your Blood Pressure, Lowering Your Rick of High Blood Pressure, Stress, etc) . Reviewed preventive care and BE FAST . Sent via mail [employer] for transportation, support groups and other services    Patient Self Care Activities:  . Self administers medications as prescribed . Attends all scheduled provider appointments . Calls provider office for new concerns, questions, or BP outside discussed parameters . Monitors BP and records as discussed . Adheres to a low sodium diet/DASH diet . Increase physical activity as tolerated . Verbalize where to go to receive medical care  Please see past updates related to this goal by clicking on the "Past Updates" button in the selected goal  Please see other previous [facility] care plan information listed in Epic under the flow sheet section           Baker Kogler L. Lavina Hamman, RN, BSN, [place]  Coordinator Office number [phone] Main [facility] number [phone] Fax number [phone]

## 2019-12-01 NOTE — Telephone Encounter (Signed)
Faxed request for medical records re: PT in hospital July- Aug 2021 to [facility] records and [facility].

## 2019-12-01 NOTE — Telephone Encounter (Signed)
Thank you :)

## 2019-12-02 NOTE — Telephone Encounter (Signed)
Please advise patient that review of prior therapy note did state improvement of residual right-sided symptoms with improvement of strength and less numbness but more paresthesias.  I would recommend a graduated return to driving program as described below.  If any concerns regarding driving ability by licensed driver accompanying her, it will be then recommended to pursue driving rehab.  Graduated return to driving instructions were provided. It is recommended that the patient first drives with another licensed driver in an empty parking lot. If the patient does well, they can drive on a quiet street with the licensed driver. If the patient does well, they can drive on a busy street with a licensed driver.  If patient continues to do well, patient will be cleared to drive independently.  For the first month after resuming driving, it is recommend no nighttime, busy/heavy traffic roads or Interstate driving.

## 2019-12-03 ENCOUNTER — Other Ambulatory Visit: Payer: Self-pay | Admitting: *Deleted

## 2019-12-03 NOTE — Patient Outreach (Addendum)
April Lowery) Care Management  12/03/2019  April Lowery 15-Sep-1945 [id number]   [place] coordination- attempt to collaborate with neurology r/t pt return to driving concern  Attempt to outreach to neurology office to speak with staff about the voiced concerns about rehab for driving cost The office is closed at time [name] called     Called [facility] [phone]   NO answer and message states messages after 4 pm are responded to the next business day  Plan [facility] RN CM will follow up with [name] within the next 14-21 business days Pt encouraged to return a call to [facility] RN CM prn   Joelene Millin L. Lavina Hamman, RN, BSN, [place] Coordinator Office number [phone] Mobile number [phone]  Main [facility] number [phone] Fax number [phone]

## 2019-12-07 ENCOUNTER — Other Ambulatory Visit: Payer: Self-pay | Admitting: *Deleted

## 2019-12-07 ENCOUNTER — Ambulatory Visit (INDEPENDENT_AMBULATORY_CARE_PROVIDER_SITE_OTHER): Payer: Medicare Other | Admitting: *Deleted

## 2019-12-07 ENCOUNTER — Encounter: Payer: Self-pay | Admitting: Family Medicine

## 2019-12-07 DIAGNOSIS — I255 Ischemic cardiomyopathy: Secondary | ICD-10-CM

## 2019-12-07 LAB — CUP PACEART REMOTE DEVICE CHECK
Battery Remaining Longevity: 154 mo
Battery Voltage: 3.03 V
Brady Statistic AP VP Percent: 18.47 %
Brady Statistic AP VS Percent: 25.32 %
Brady Statistic AS VP Percent: 27.36 %
Brady Statistic AS VS Percent: 28.85 %
Brady Statistic RA Percent Paced: 43.77 %
Brady Statistic RV Percent Paced: 45.84 %
Date Time Interrogation Session: 20210913205223
Implantable Lead Implant Date: 20191121
Implantable Lead Implant Date: 20191121
Implantable Lead Location: 753859
Implantable Lead Location: 753860
Implantable Lead Model: 4076
Implantable Lead Model: 4076
Implantable Pulse Generator Implant Date: 20191121
Lead Channel Impedance Value: 304 Ohm
Lead Channel Impedance Value: 380 Ohm
Lead Channel Impedance Value: 437 Ohm
Lead Channel Impedance Value: 475 Ohm
Lead Channel Pacing Threshold Amplitude: 0.75 V
Lead Channel Pacing Threshold Amplitude: 0.75 V
Lead Channel Pacing Threshold Pulse Width: 0.4 ms
Lead Channel Pacing Threshold Pulse Width: 0.4 ms
Lead Channel Sensing Intrinsic Amplitude: 3.375 mV
Lead Channel Sensing Intrinsic Amplitude: 3.375 mV
Lead Channel Sensing Intrinsic Amplitude: 6.75 mV
Lead Channel Sensing Intrinsic Amplitude: 6.75 mV
Lead Channel Setting Pacing Amplitude: 1.5 V
Lead Channel Setting Pacing Amplitude: 2 V
Lead Channel Setting Pacing Pulse Width: 0.4 ms
Lead Channel Setting Sensing Sensitivity: 1.2 mV

## 2019-12-07 NOTE — Patient Outreach (Addendum)
[facility]) Care Management  12/07/2019  April Lowery April 22, 1945 [id number]   [place] coordination- medical transportation  [facility] RN CM called to [facility] [phone]  [facility] RN CM was requested to leave a message  A message was left for [facility] requesting a return call to assist patient with local transportation Left [facility] RN CM office number   [facility] RN CM spoke with April Lowery at [place] outpatient rehab to see if patient could receive assistance for the graduated return to driving program her neurologist recommended   [facility] RN CM outreached to [place] neurology with an unsuccessful attempt to speak with April Lowery about a possible less expensive graduated return to driving program for patient  Plan [facility] RN CM will follow up with April Lowery within the next 30 days  April Yepes L. Lavina Hamman, RN, BSN, [place] Coordinator Office number [phone] Mobile number [phone]  Main [facility] number [phone] Fax number [phone]

## 2019-12-08 ENCOUNTER — Encounter: Payer: Self-pay | Admitting: Family Medicine

## 2019-12-08 ENCOUNTER — Telehealth (INDEPENDENT_AMBULATORY_CARE_PROVIDER_SITE_OTHER): Payer: Medicare Other | Admitting: Family Medicine

## 2019-12-08 VITALS — HR 72 | Ht 65.0 in

## 2019-12-08 DIAGNOSIS — I10 Essential (primary) hypertension: Secondary | ICD-10-CM | POA: Diagnosis not present

## 2019-12-08 DIAGNOSIS — I2510 Atherosclerotic heart disease of native coronary artery without angina pectoris: Secondary | ICD-10-CM

## 2019-12-08 DIAGNOSIS — I255 Ischemic cardiomyopathy: Secondary | ICD-10-CM | POA: Diagnosis not present

## 2019-12-08 DIAGNOSIS — E1169 Type 2 diabetes mellitus with other specified complication: Secondary | ICD-10-CM | POA: Diagnosis not present

## 2019-12-08 DIAGNOSIS — R0600 Dyspnea, unspecified: Secondary | ICD-10-CM | POA: Diagnosis not present

## 2019-12-08 DIAGNOSIS — R0609 Other forms of dyspnea: Secondary | ICD-10-CM

## 2019-12-08 NOTE — Progress Notes (Signed)
Virtual Visit via Video Note I connected with April Lowery on 12/08/19 by a video enabled telemedicine application and verified that I am speaking with the correct person using two identifiers.  Location patient: home Location provider:work office Persons participating in the virtual visit: patient, provider  I discussed the limitations of evaluation and management by telemedicine and the availability of in person appointments. The patient expressed understanding and agreed to proceed.  HPI: April Lowery Is a 74 year old female with history of CAD, complete atrioventricular block (with pacemaker), hypertension, hyperlipidemia, and CVA concerned about 5 days of upper and lower extremity weakness and exertional dyspnea. Alleviated after resting for about 10 minutes.  There is no focal deficit but a sense of extremities tiredness. Problem is intermittent.  No associated fever,chills,sore throat,CP,palpitations,diaphoresis,cough,wheezing.abdominal pain,N/V, urinary symptoms,edema, or dizziness. Problem is stable. No new medications.  DM II: BS's 90-101. She has felt like her BS is low but when checked BS in normal, 90's-low 110's. She is on non pharmacologic treatment.  Lab Results  Component Value Date   HGBA1C 7.0 (H) 10/08/2019   HTN:She is not checking BP regularly. She is on Metoprolol Succinate 50 mg daily. Follows with cardiologist.  Lab Results  Component Value Date   CREATININE 0.90 10/18/2019   BUN 11 10/18/2019   NA 139 10/18/2019   K 4.3 10/18/2019   CL 103 10/18/2019   CO2 29 10/18/2019   CVA in 09/2019. She is on Plavix 75 mg daily and Aspirin 325 mg daily. Appt with neurologist on 02/21/20. + Fatigue. Appetite is good.  ROS: See pertinent positives and negatives per HPI.  Past Medical History:  Diagnosis Date  . Acute ST segment elevation myocardial infarction ([place])   . Arthritis   . CAD (coronary artery disease) 02/15/2019  . Cancer of left kidney ([place])   .  Complete atrioventricular block (HCC)   . DVT (deep venous thrombosis) ([place]) 02/15/2019  . GI bleeding   . 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 HLD (hyperlipidemia) 02/15/2019  . HTN (hypertension) 02/15/2019  . Ischemic cardiomyopathy   . Melena   . Pacemaker   . Pulmonary thromboembolism ([place]) 02/15/2019    Past Surgical History:  Procedure Laterality Date  . CHOLECYSTECTOMY    . CORONARY ARTERY BYPASS GRAFT     STENTS  . PACEMAKER INSERTION    . TONSILLECTOMY AND ADENOIDECTOMY  2017    Family History  Problem Relation Age of Onset  . Heart disease Mother   . Heart attack Mother   . Heart disease Father   . Heart attack Father   . Heart disease Brother   . Heart attack Brother   . Heart attack Maternal Grandmother   . Heart attack Maternal Grandfather   . Heart attack Paternal Grandmother   . Heart attack Paternal Grandfather     Social History   Socioeconomic History  . Marital status: Widowed    Spouse name: Not on file  . Number of children: 4  . Years of education: Not on file  . Highest education level: Not on file  Occupational History  . Occupation: retired  Tobacco Use  . Smoking status: Former Research scientist (life sciences)  . Smokeless tobacco: Never Used  Vaping Use  . Vaping Use: Never used  Substance and Sexual Activity  . Alcohol use: Never  . Drug use: Never  . Sexual activity: Not on file    Comment: MARRIED  Other Topics Concern  . Not on file  Social History Narrative  .  Not on file   Social Determinants of Health   Financial Resource Strain: Low Risk   . Difficulty of Paying Living Expenses: Not hard at all  Food Insecurity: No Food Insecurity  . Worried About Charity fundraiser in the Last Year: Never true  . Ran Out of Food in the Last Year: Never true  Transportation Needs: No Transportation Needs  . Lack of Transportation (Medical): No  . Lack of Transportation (Non-Medical): No  Physical Activity: Sufficiently Active  . Days of Exercise  per Week: 5 days  . Minutes of Exercise per Session: 30 min  Stress: No Stress Concern Present  . Feeling of Stress : Not at all  Social Connections: Moderately Isolated  . Frequency of Communication with Friends and Family: More than three times a week  . Frequency of Social Gatherings with Friends and Family: More than three times a week  . Attends Religious Services: Never  . Active Member of Clubs or Organizations: Yes  . Attends Archivist Meetings: Never  . Marital Status: Widowed  Intimate Partner Violence: Not At Risk  . Fear of Current or Ex-Partner: No  . Emotionally Abused: No  . Physically Abused: No  . Sexually Abused: No    Current Outpatient Medications:  .  acetaminophen (TYLENOL) 650 MG CR tablet, Take 650 mg by mouth every 8 (eight) hours as needed for pain., Disp: , Rfl:  .  aspirin EC 325 MG EC tablet, Take 1 tablet (325 mg total) by mouth daily., Disp: 90 tablet, Rfl: 0 .  cholecalciferol (VITAMIN D3) 25 MCG (1000 UT) tablet, Take 1,000 Units by mouth daily., Disp: , Rfl:  .  clopidogrel (PLAVIX) 75 MG tablet, TAKE 1 TABLET BY MOUTH EVERY DAY, Disp: 30 tablet, Rfl: 0 .  Cyanocobalamin (B-12) 2500 MCG TABS, Take 1 tablet by mouth daily., Disp: , Rfl:  .  ezetimibe (ZETIA) 10 MG tablet, Take 1 tablet (10 mg total) by mouth daily., Disp: 30 tablet, Rfl: 11 .  metoprolol succinate (TOPROL-XL) 50 MG 24 hr tablet, Take 1 tablet (50 mg total) by mouth daily. Take with or immediately following a meal., Disp: 30 tablet, Rfl: 11 .  omeprazole (PRILOSEC) 20 MG capsule, Take 20 mg by mouth as needed (acid reflux). , Disp: , Rfl:  .  rosuvastatin (CRESTOR) 40 MG tablet, Take 1 tablet (40 mg total) by mouth at bedtime., Disp: 30 tablet, Rfl: 0 .  sertraline (ZOLOFT) 50 MG tablet, Take 1 tablet (50 mg total) by mouth daily., Disp: 90 tablet, Rfl: 1  EXAM:  VITALS per patient if applicable:Pulse 72   Ht 5\' 5"  (1.651 m)   SpO2 98%   BMI 29.62 kg/m   GENERAL: alert,  oriented, appears well and in no acute distress  HEENT: atraumatic, conjunctiva clear, no obvious abnormalities on inspection.  NECK: normal movements of the head and neck  LUNGS: on inspection no signs of respiratory distress, breathing rate appears normal, no obvious gross SOB, gasping or wheezing  CV: no obvious cyanosis  April: moves all visible extremities without noticeable abnormality  PSYCH/NEURO: pleasant and cooperative, no obvious depression or anxiety, speech and thought processing grossly intact  ASSESSMENT AND PLAN:  Discussed the following assessment and plan:  DOE (dyspnea on exertion) - Plan: BASIC METABOLIC PANEL WITH GFR, CBC, DG Chest 2 View No other associated symptoms. ? Deconditioning. Cardiac etiology to be considered.  Clearly instructed about warning signs. Further recommendations according to CXR and lab results, will  go to [employer].  Coronary artery disease involving native coronary artery of native heart, angina presence unspecified Continue Metoprolol Succinate , Crestor,and Aspirin. She has appt with her cardiologist.  Essential hypertension Recommend monitoring BP regularly. No changes in current management.  Type 2 diabetes mellitus with other specified complication, without long-term current use of insulin ([place]) Otherwise adequately controlled. Continue monitoring BP regularly.  I discussed the assessment and treatment plan with the patient. April Lowery was provided an opportunity to ask questions and all were answered. She agreed with the plan and demonstrated an understanding of the instructions.   The patient was advised to call back or seek an in-person evaluation if the symptoms worsen or if the condition fails to improve as anticipated.  Return in about 2 weeks (around 12/22/2019) for SOB.   Kayler Buckholtz [place], MD

## 2019-12-09 NOTE — Progress Notes (Signed)
Patient scheduled for a telephone visit 12/22/2019 at 4 PM

## 2019-12-09 NOTE — Progress Notes (Signed)
Remote pacemaker transmission.   

## 2019-12-10 ENCOUNTER — Other Ambulatory Visit (INDEPENDENT_AMBULATORY_CARE_PROVIDER_SITE_OTHER): Payer: Medicare Other

## 2019-12-10 ENCOUNTER — Other Ambulatory Visit: Payer: Self-pay | Admitting: *Deleted

## 2019-12-10 ENCOUNTER — Other Ambulatory Visit: Payer: Self-pay | Admitting: Internal Medicine

## 2019-12-10 ENCOUNTER — Other Ambulatory Visit: Payer: Self-pay

## 2019-12-10 ENCOUNTER — Ambulatory Visit (INDEPENDENT_AMBULATORY_CARE_PROVIDER_SITE_OTHER)
Admission: RE | Admit: 2019-12-10 | Discharge: 2019-12-10 | Disposition: A | Discharge status: Home / Self Care | Payer: Medicare Other | Source: Ambulatory Visit | Attending: Family Medicine | Admitting: Family Medicine

## 2019-12-10 DIAGNOSIS — R0609 Other forms of dyspnea: Secondary | ICD-10-CM

## 2019-12-10 DIAGNOSIS — R0600 Dyspnea, unspecified: Secondary | ICD-10-CM

## 2019-12-10 LAB — BASIC METABOLIC PANEL
BUN: 14 mg/dL (ref 6–23)
CO2: 25 mEq/L (ref 19–32)
Calcium: 9.2 mg/dL (ref 8.4–10.5)
Chloride: 104 mEq/L (ref 96–112)
Creatinine, Ser: 1 mg/dL (ref 0.40–1.20)
GFR: 54.45 mL/min — ABNORMAL LOW (ref >60.00)
Glucose, Bld: 135 mg/dL — ABNORMAL HIGH (ref 70–99)
Potassium: 3.7 mEq/L (ref 3.5–5.1)
Sodium: 140 mEq/L (ref 135–145)

## 2019-12-10 LAB — CBC
HCT: 38.5 % (ref 36.0–46.0)
Hemoglobin: 12.9 g/dL (ref 12.0–15.0)
MCHC: 33.5 g/dL (ref 30.0–36.0)
MCV: 87.6 fl (ref 78.0–100.0)
Platelets: 214 10*3/uL (ref 150.0–400.0)
RBC: 4.4 Mil/uL (ref 3.87–5.11)
RDW: 14 % (ref 11.5–15.5)
WBC: 8.9 10*3/uL (ref 4.0–10.5)

## 2019-12-10 IMAGING — DX DG CHEST 2V
2 series · 2 of 2 positions shown · non-contrast
Comparison: Radiograph [DATE]

CLINICAL DATA: Five days of dyspnea on exertion without associated
symptoms

EXAM:
CHEST - 2 VIEW

[chest pa]
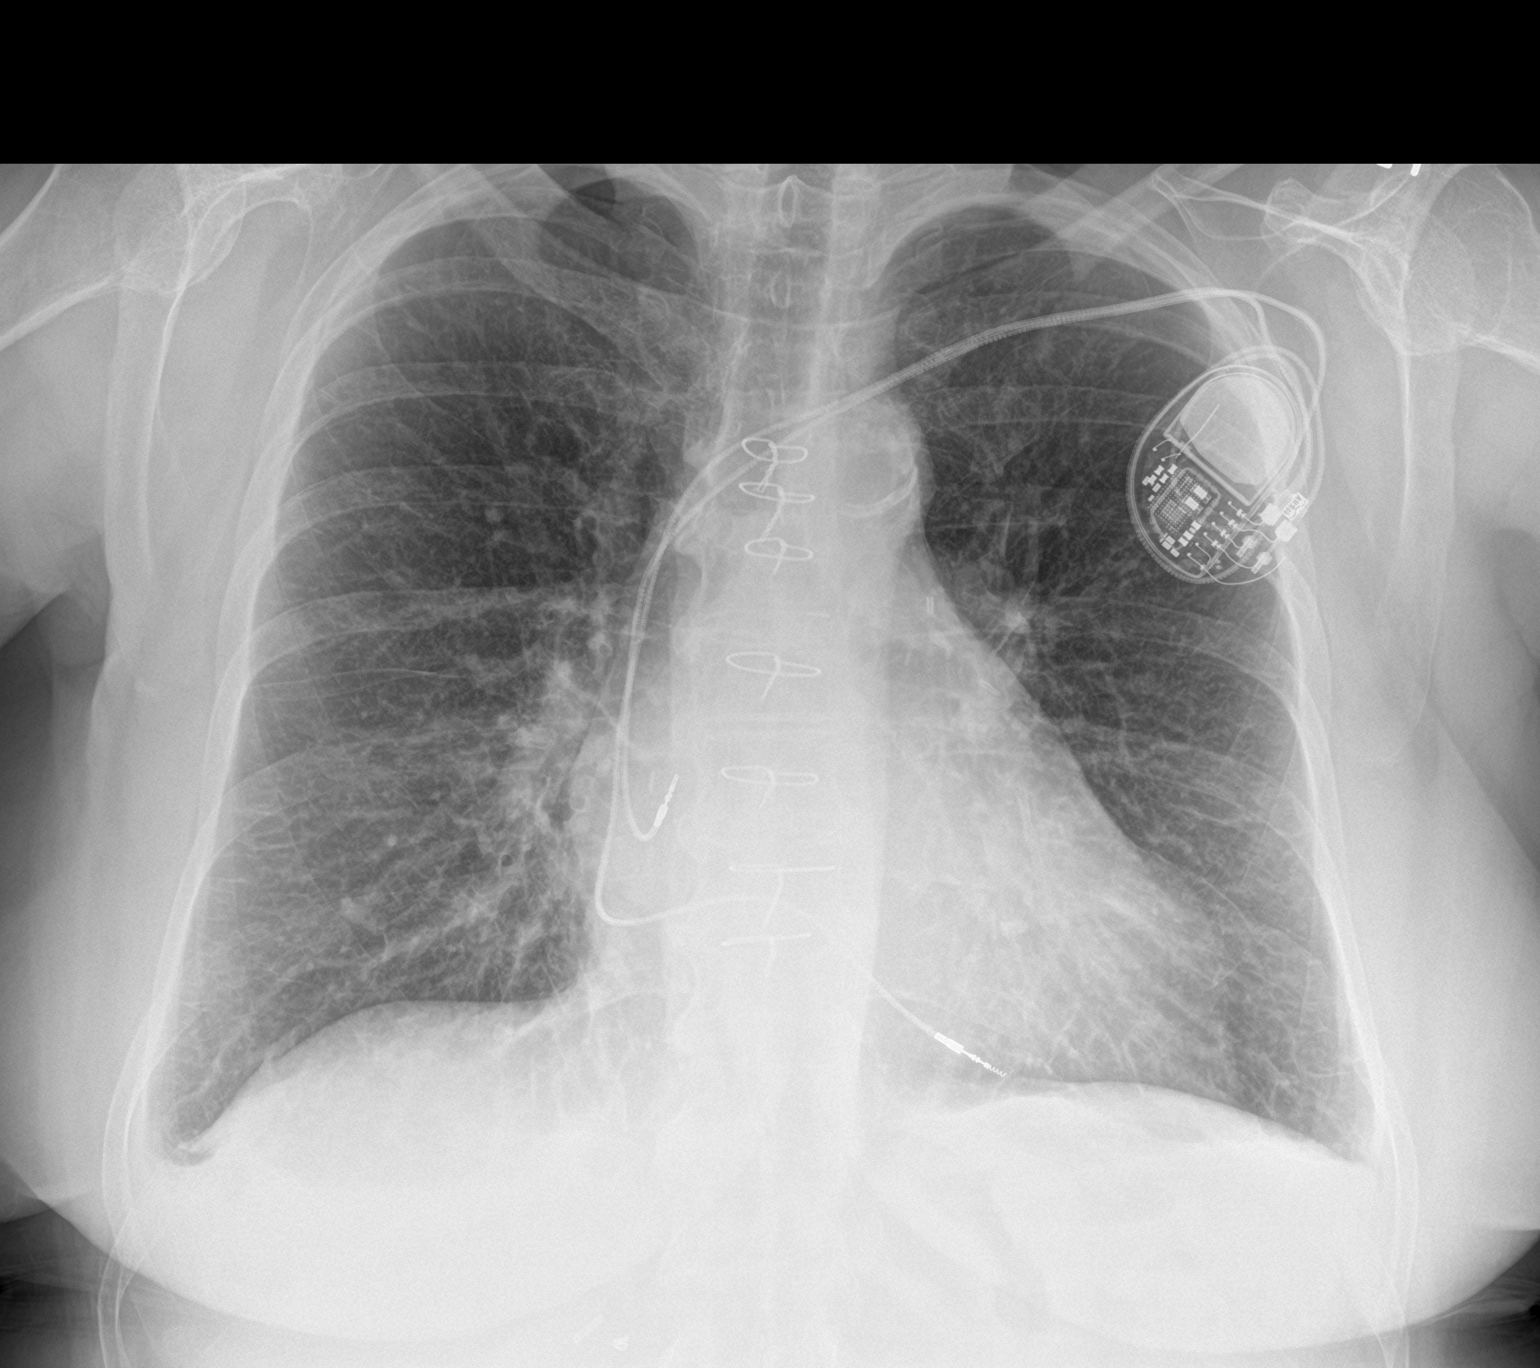

[chest lat]
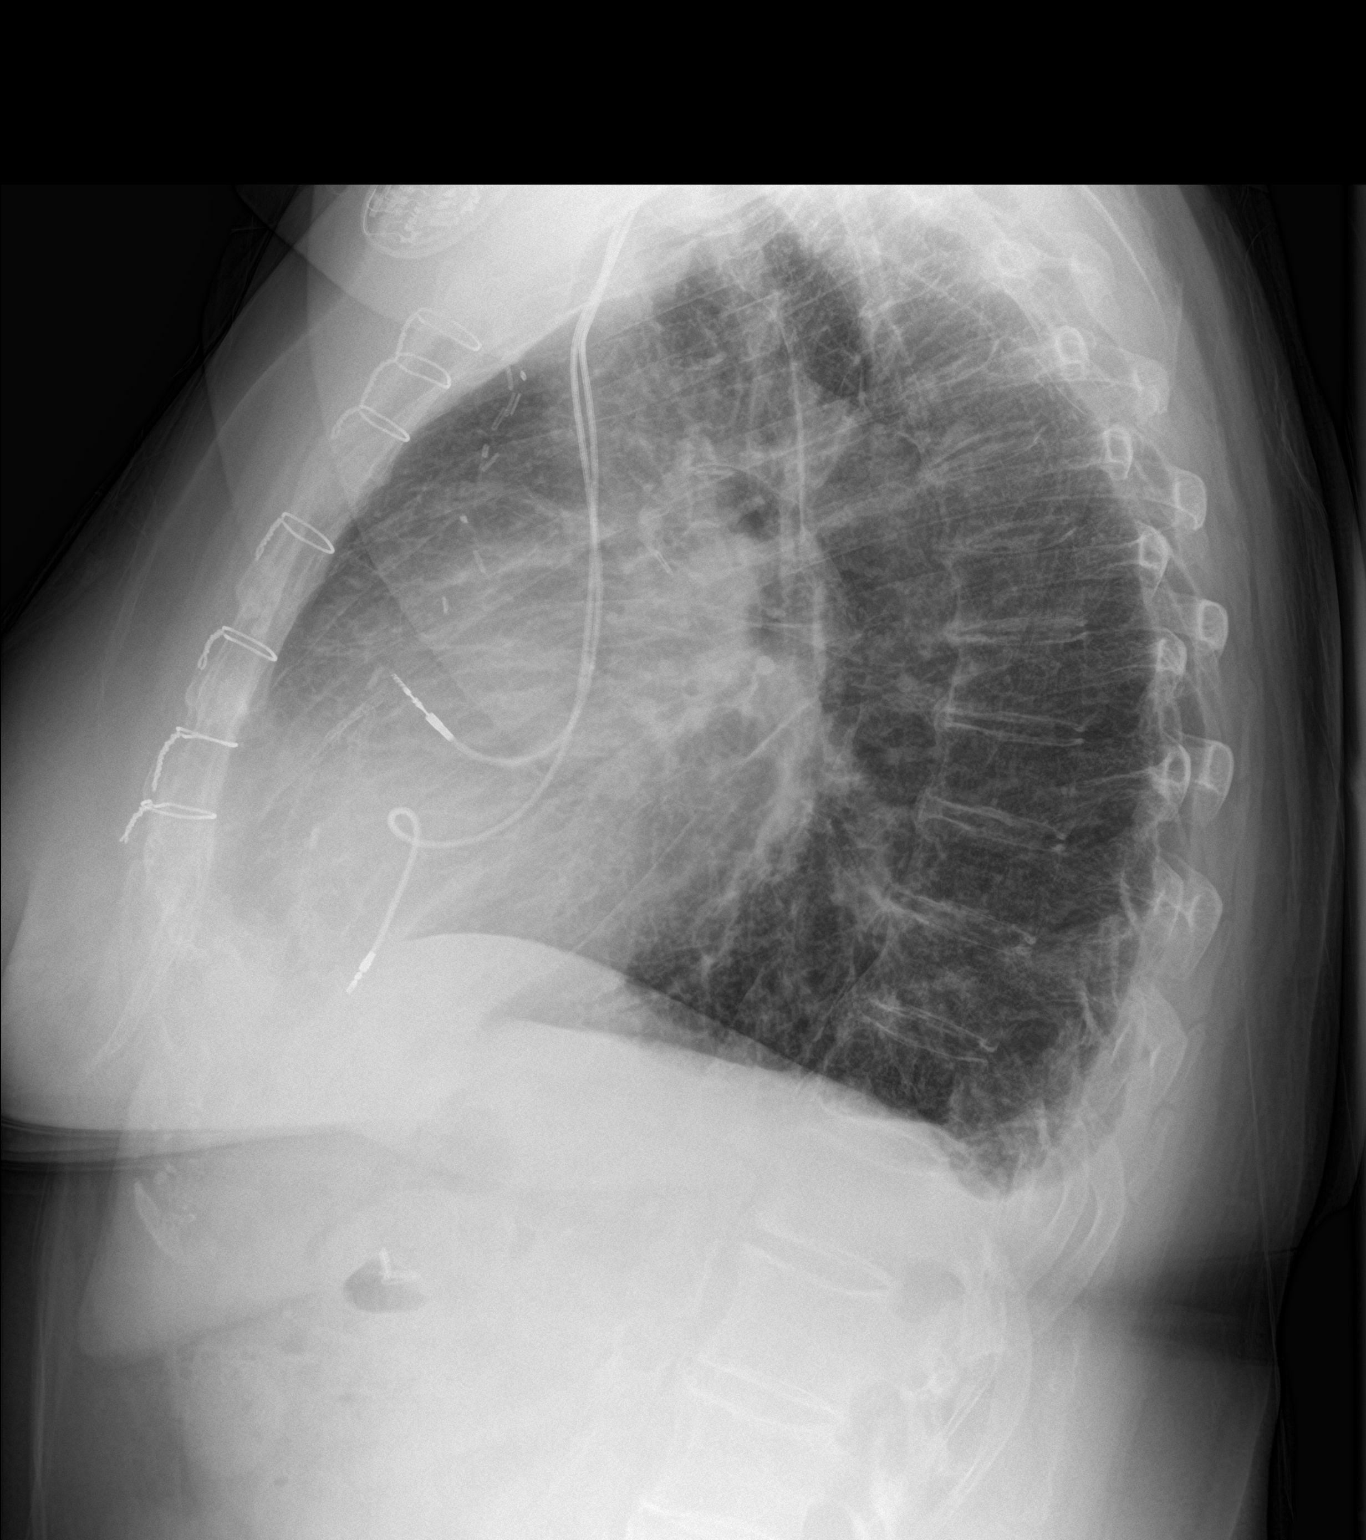

[2 of 2 positions shown; findings below may reference images not displayed]

FINDINGS: Increasing reticular interstitial opacities, fissural and septal
thickening as well as some mildly cephalized, redistributed
pulmonary vascularity and central cuffing. Cardiac silhouette is
upper limits normal size. Postsurgical changes prior sternotomy and
CABG as well as coronary stents projecting over the cardiac
silhouette. Pacer pack overlies left chest wall with leads at the
cardiac apex and right atrium. The aorta is calcified. The remaining
cardiomediastinal contours are unremarkable. No acute osseous or
soft tissue abnormality. Degenerative changes are present in the
imaged spine and shoulders.
IMPRESSION: 1. Findings consistent with mild interstitial pulmonary edema.
2. Prior sternotomy, CABG and coronary artery stenting.
3. Left chest wall pacer pack with stable positioning of well seated
leads.

## 2019-12-10 NOTE — Patient Outreach (Signed)
[facility]) Care Management  12/10/2019  April Lowery April 09, 1945 [id number]   [facility] outreach for follow toEMMI- strokereferred patient  April Lowery was referred to [facility] on 10/21/19 forRED ON EMMI ALERT Day #1Date:Wednesday 7/28/21Red Alert Reason:Loss of interest, feeling sad? yes The EMMI was resolved on 7/29/21[facility] SW services and she did not prefer [facility] SW referral  Insurance: NextGen Medicareand bankers supplement/[facility] admissions x1ED visits x in the last 6 months  Last admission 10/06/19 to 10/08/19 for stroke/TIA  Patient is able to verify HIPAA ([place]) identifiers Reviewed and addressed the purpose of the follow up call with the patient  Consent: [facility] ([place]) RN CM reviewed [facility] services with patient. Patient gave verbal consent for services.   Subjective April Lowery confirms she did receive outreach from Neurology about a better action plan for transportation and driving She is now allowed to drive with her son She has been doing this week without any noted difficulty with peripheral vision, etc   Noted recent symptoms of Sob and arms get weak, get tired with activity  She reports it taking a long time for her to clean her living room as she had to rest at intervals Pt called Dr Martinique who had her to complete labs imaging on this am and scheduled for follow up on 12/22/19   No wheeze or cough arms and legs stop sit and rest get better  No leg edema No noted chest jolts, pain, movement or pressure voiced   Objective  CBC    Component Value Date/Time   WBC 8.9 12/10/2019 0918   RBC 4.40 12/10/2019 0918   HGB 12.9 12/10/2019 0918   HCT 38.5 12/10/2019 0918   PLT 214.0 12/10/2019 0918   MCV 87.6 12/10/2019 0918   MCH 29.3 10/18/2019 1614   MCHC 33.5 12/10/2019 0918   RDW 14.0 12/10/2019 0918   LYMPHSABS 2.1 10/06/2019 1935   MONOABS 0.6 10/06/2019 1935    EOSABS 0.3 10/06/2019 1935   BASOSABS 0.1 10/06/2019 1935   BMP Latest Ref Rng & Units 12/10/2019 10/18/2019 10/06/2019  Glucose 70 - 99 mg/dL 135(H) 94 132(H)  BUN 6 - 23 mg/dL 14 11 11   Creatinine 0.40 - 1.20 mg/dL 1.00 0.90 0.74  BUN/Creat Ratio 6 - 22 (calc) - NOT APPLICABLE -  Sodium 371 - 145 mEq/L 140 139 138  Potassium 3.5 - 5.1 mEq/L 3.7 4.3 3.9  Chloride 96 - 112 mEq/L 104 103 104  CO2 19 - 32 mEq/L 25 29 23   Calcium 8.4 - 10.5 mg/dL 9.2 9.5 9.2   Past Medical History:  Diagnosis Date  . Acute ST segment elevation myocardial infarction ([place])   . Arthritis   . CAD (coronary artery disease) 02/15/2019  . Cancer of left kidney ([place])   . Complete atrioventricular block (HCC)   . DVT (deep venous thrombosis) ([place]) 02/15/2019  . GI bleeding   . 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 HLD (hyperlipidemia) 02/15/2019  . HTN (hypertension) 02/15/2019  . Ischemic cardiomyopathy   . Melena   . Pacemaker   . Pulmonary thromboembolism ([place]) 02/15/2019    Assessment She and [facility] RN CM discussed the transportation and driving interventions since the last outreach Agreed to the action plan of continuing to drive with her son Assessed present reported symptoms of shortness of breath (sob) and both arm weakness or tiredness only when she is active Questions answered about pacemaker firing or symptoms  from pacemaker abnormalities Confirmed her action plan to call MD office if symptoms increase or EMS prn  Plan [facility] RN CM will follow up with April Lowery within the next 30 business days Pt encouraged to return a call to [place] and 24 hour nurse call center prn  Goals Addressed              This Visit's Progress     Patient Stated   .  [facility]) Patient will be able to verbalize management of stroke, diabetes and hypertension at home (pt-stated)   On track     [place] (see longtitudinal plan of care for additional care plan information)  Objective:  . Last practice  recorded BP readings:  BP Readings from Last 3 Encounters:  11/10/19 133/64  10/18/19 (!) 120/62  10/08/19 130/60 .   Marland Kitchen Most recent eGFR/CrCl: No results found for: EGFR  No components found for: CRCL  Current Barriers:  Marland Kitchen Knowledge deficit related to self care management of hypertension . Cognitive Deficits  Case Manager Clinical Goal(s):  Marland Kitchen Over the next 31 days, patient will not experience hospital admission. Hospital Admissions in last 6 months = 1 11/18/19 met last hospital discharge 10/13/19 . Over the next 90 days, patient will verbalize basic understanding of hypertension disease process and self health management plan as evidenced by BP < 130/80 progressing DBP in 60-70's per pt   12/10/19 progressing -completing pt self care activities Pending tests/imaging from 12/10/19  . Over the next 30 days patient will have access to local transportation or be informed she has been released to drive per neurology - 12/10/19 Goal met   Interventions:  . Evaluation of current treatment plan related to hypertension self management and patient's adherence to plan as established by provider. . Reviewed medications with patient and discussed importance of compliance . Discussed plans with patient for ongoing care management follow up and provided patient with direct contact information for care management team . Advised patient, providing education and rationale, to monitor blood pressure daily and record, calling PCP for findings outside established parameters.  . Reviewed scheduled/upcoming provider appointments including:  . Provided education regarding s/s of stroke and stroke prevention . Discussed signs and symptoms of hypertension . Provided educational material:  EMMI (Weight Loss Tips, Weight Loss Diet, Chair Exercises, Relaxation Techniques, Low Sodium Diet, DASH Diet, Controlling your Blood Pressure through Lifestyle, High Blood Pressure Health Problems, High Blood Pressure Taking Your Blood  Pressure, Lowering Your Rick of High Blood Pressure, Stress, etc) . Reviewed preventive care and BE FAST . Sent via [place] for transportation, support groups and other services  . 12/10/19 assessed for transportation resolution and present symptoms reported to pcp, Reviewed labs, pending cxr, discussed action plan for any increase symptoms   Patient Self Care Activities:  . Self administers medications as prescribed . Attends all scheduled provider appointments . Calls provider office for new concerns, questions, or BP outside discussed parameters . Monitors BP and records as discussed . Adheres to a low sodium diet/DASH diet . Increase physical activity as tolerated . Verbalize where to go to receive medical care  Please see past updates related to this goal by clicking on the "Past Updates" button in the selected goal  Please see other previous [facility] care plan information listed in Epic under the flow sheet section           Enslee Bibbins L. Lavina Hamman, RN, BSN, CCM [facility] Telephonic Care Management  Care Coordinator Office number [phone] [facility] number [phone] Fax number [phone]

## 2019-12-10 NOTE — Addendum Note (Signed)
Addended by: Jacob Moores on: 12/10/2019 09:17 AM   Modules accepted: Orders

## 2019-12-13 ENCOUNTER — Other Ambulatory Visit: Payer: Self-pay

## 2019-12-15 ENCOUNTER — Other Ambulatory Visit: Payer: Self-pay | Admitting: Internal Medicine

## 2019-12-22 ENCOUNTER — Other Ambulatory Visit: Payer: Self-pay

## 2019-12-22 ENCOUNTER — Telehealth: Payer: Medicare Other | Admitting: Family Medicine

## 2019-12-22 ENCOUNTER — Ambulatory Visit (INDEPENDENT_AMBULATORY_CARE_PROVIDER_SITE_OTHER): Payer: Medicare Other | Admitting: Family Medicine

## 2019-12-22 ENCOUNTER — Ambulatory Visit (INDEPENDENT_AMBULATORY_CARE_PROVIDER_SITE_OTHER): Payer: Medicare Other

## 2019-12-22 ENCOUNTER — Encounter: Payer: Self-pay | Admitting: Family Medicine

## 2019-12-22 VITALS — BP 118/60 | HR 79 | Resp 16 | Ht 65.0 in | Wt 178.0 lb

## 2019-12-22 DIAGNOSIS — I5020 Unspecified systolic (congestive) heart failure: Secondary | ICD-10-CM | POA: Diagnosis not present

## 2019-12-22 DIAGNOSIS — R2681 Unsteadiness on feet: Secondary | ICD-10-CM | POA: Diagnosis not present

## 2019-12-22 DIAGNOSIS — R0602 Shortness of breath: Secondary | ICD-10-CM | POA: Diagnosis not present

## 2019-12-22 DIAGNOSIS — I10 Essential (primary) hypertension: Secondary | ICD-10-CM

## 2019-12-22 DIAGNOSIS — I255 Ischemic cardiomyopathy: Secondary | ICD-10-CM

## 2019-12-22 IMAGING — DX DG CHEST 2V
2 series · 2 of 2 positions shown · non-contrast
Comparison: [DATE]

CLINICAL DATA: Shortness of breath, improving.

EXAM:
CHEST - 2 VIEW

[chest pa]
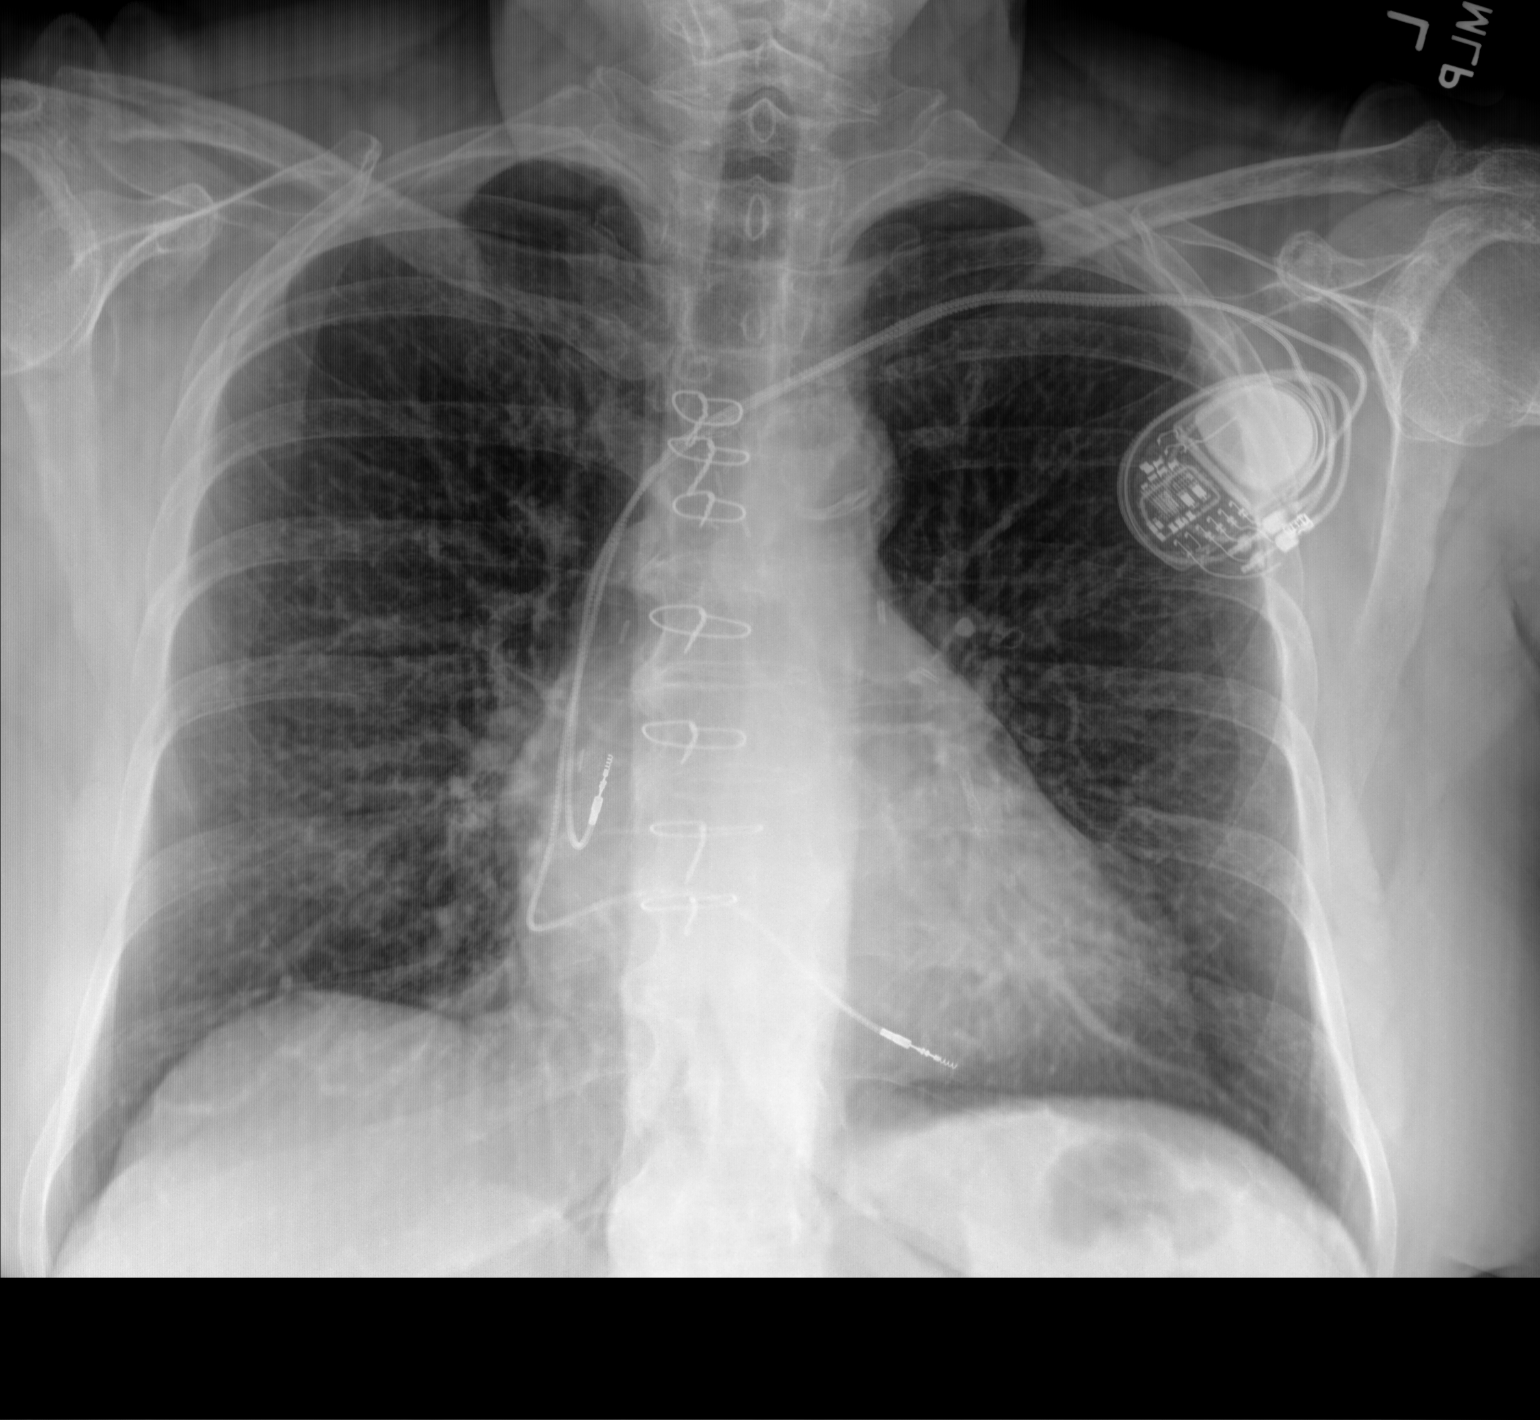

[chest lat]
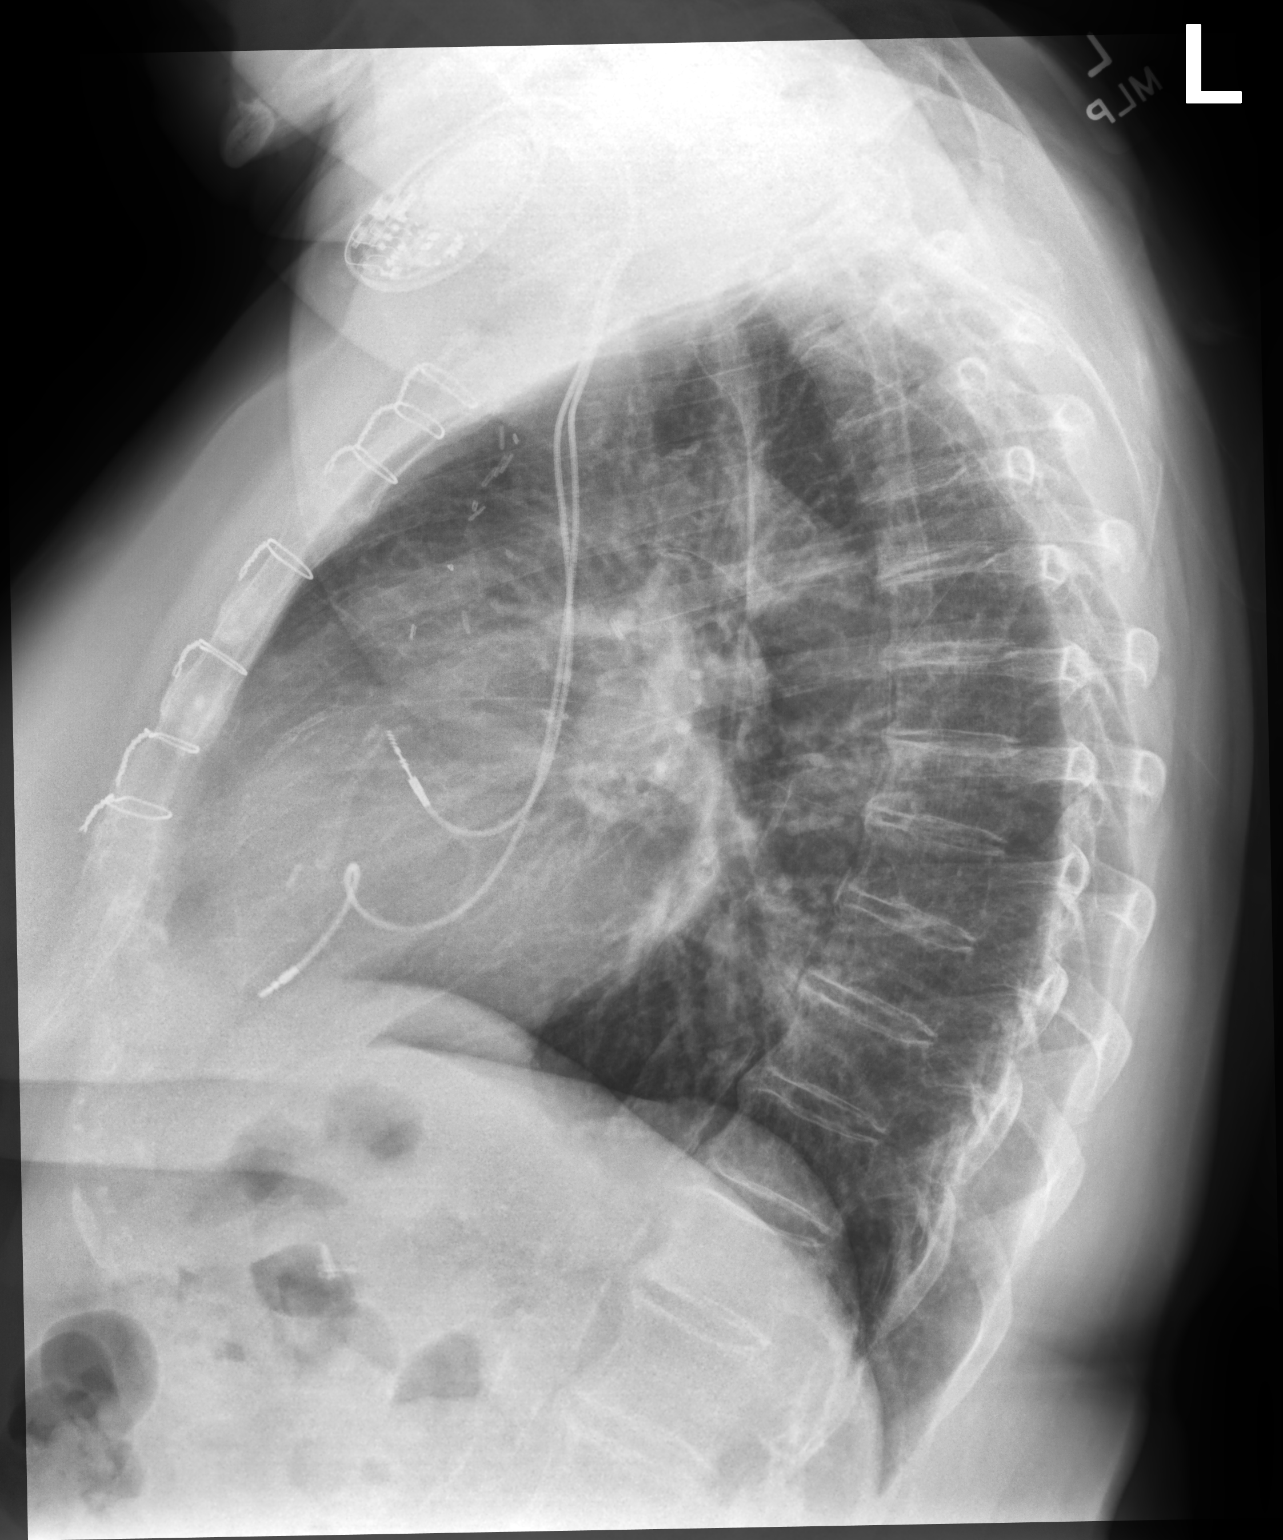

[2 of 2 positions shown; findings below may reference images not displayed]

FINDINGS: Postoperative changes in the mediastinum. Cardiac pacemaker. Heart
size and pulmonary vascularity are normal. Interstitial changes are
resolved since previous study suggesting improving edema. Pleural
effusions have resolved also. No focal consolidation. No
pneumothorax. Mediastinal contours appear intact. Calcification of
the aorta. Degenerative changes in the spine.
IMPRESSION: Interval resolution of pulmonary edema and pleural effusions.

## 2019-12-22 NOTE — Patient Instructions (Signed)
A few things to remember from today's visit:   SOB (shortness of breath) on exertion - Plan: Brain Natriuretic Peptide, DG Chest 2 View  Essential (primary) hypertension  If you need refills please call your pharmacy. Do not use My Chart to request refills or for acute issues that need immediate attention.   Depending on labs we will decide about next step, fluid pill will be recommended in lab is abnormal. Monitor for new symptoms.  Please be sure medication list is accurate. If a new problem present, please set up appointment sooner than planned today.

## 2019-12-22 NOTE — Progress Notes (Addendum)
HPI: [name] is a 74 y.o. female with hx of DM II,HLD,CAD,CVA,and anxiety here today with her son for follow up.   She was last seen on 12/08/19 to follow on SOB and to discuss CXR results. Last visit,virtual,she was concerned about bilateral upper and lower extremities "weakness", no new focal neurologic deficit; and SOB exacerbated by exertion. Negative for new associated symptom.  She has not had CP,palpitations,cough,wheezing,N/V,or edema.  CXR on 12/10/19: 1. Findings consistent with mild interstitial pulmonary edema. 2. Prior sternotomy, CABG and coronary artery stenting. 3. Left chest wall pacer pack with stable positioning of well seated leads. Exertional dyspnea has improved some (<50%) but not resolved. She wonders if stopping Lisinopril has caused some of the symptoms.Lisinopril 5 mg was discontinued at hospital discharge after CVA.  She is on Metoprolol XL 50 mg daily.  Component     Latest Ref Rng & Units 12/10/2019  Sodium     135 - 146 mmol/L 140  Potassium     3.5 - 5.3 mmol/L 3.7  Chloride     98 - 110 mmol/L 104  CO2     20 - 32 mmol/L 25  Glucose     65 - 99 mg/dL 135 (H)  BUN     7 - 25 mg/dL 14  Creatinine     0.60 - 0.93 mg/dL 1.00  GFR     >60.00 mL/min 54.45 (L)  Calcium     8.6 - 10.4 mg/dL 9.2   Echo 10/07/19 LVEF 40-45%.  Akinesis of inferolateral wall and apex. Left ventricular diastolic parameters are consistent with Grade I diastolic dysfunction (impaired relaxation).  She has not noted orthopnea or PND. She sleeps on her recliner, she cannot sleep well in bed.  Home BP readings BP 140's/80. 10/04/19 CVA with residual right-sided weakness and unstable gait. She is on Rosuvastatin 40 mg, Zetia 10 mg,and Plavix.  She has not noted abdominal pain or urinary symptoms.  Lab Results  Component Value Date   WBC 8.9 12/10/2019   HGB 12.9 12/10/2019   HCT 38.5 12/10/2019   MCV 87.6 12/10/2019   PLT 214.0 12/10/2019   Review of  Systems  Constitutional: Positive for fatigue. Negative for activity change, appetite change and fever.  HENT: Negative for mouth sores, nosebleeds and trouble swallowing.   Eyes: Negative for redness and visual disturbance.  Gastrointestinal:       Negative for changes in bowel habits.  Genitourinary: Negative for decreased urine volume, dysuria and hematuria.  Musculoskeletal: Positive for gait problem. Negative for joint swelling.  Skin: Negative for pallor and rash.  Neurological: Negative for syncope and headaches.  Rest of ROS, see pertinent positives sand negatives in HPI  Current Outpatient Medications on File Prior to Visit  Medication Sig Dispense Refill  . acetaminophen (TYLENOL) 650 MG CR tablet Take 650 mg by mouth every 8 (eight) hours as needed for pain.    Marland Kitchen aspirin EC 325 MG EC tablet Take 1 tablet (325 mg total) by mouth daily. 90 tablet 0  . cholecalciferol (VITAMIN D3) 25 MCG (1000 UT) tablet Take 1,000 Units by mouth daily.    . clopidogrel (PLAVIX) 75 MG tablet TAKE 1 TABLET BY MOUTH EVERY DAY 90 tablet 3  . Cyanocobalamin (B-12) 2500 MCG TABS Take 1 tablet by mouth daily.    Marland Kitchen ezetimibe (ZETIA) 10 MG tablet Take 1 tablet (10 mg total) by mouth daily. 30 tablet 11  . metoprolol succinate (TOPROL-XL) 50 MG 24  hr tablet TAKE 1 TABLET (50 MG TOTAL) BY MOUTH DAILY. TAKE WITH OR IMMEDIATELY FOLLOWING A MEAL. 90 tablet 2  . omeprazole (PRILOSEC) 20 MG capsule Take 20 mg by mouth as needed (acid reflux).     . rosuvastatin (CRESTOR) 40 MG tablet TAKE 1 TABLET BY MOUTH EVERYDAY AT BEDTIME 90 tablet 3  . sertraline (ZOLOFT) 50 MG tablet Take 1 tablet (50 mg total) by mouth daily. 90 tablet 1   No current facility-administered medications on file prior to visit.   Past Medical History:  Diagnosis Date  . Acute ST segment elevation myocardial infarction ([place])   . Arthritis   . CAD (coronary artery disease) 02/15/2019  . Cancer of left kidney ([place])   . Complete  atrioventricular block (HCC)   . DVT (deep venous thrombosis) ([place]) 02/15/2019  . GI bleeding   . 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 HLD (hyperlipidemia) 02/15/2019  . HTN (hypertension) 02/15/2019  . Ischemic cardiomyopathy   . Melena   . Pacemaker   . Pulmonary thromboembolism ([place]) 02/15/2019   Allergies  Allergen Reactions  . Penicillins Rash    Social History   Socioeconomic History  . Marital status: Widowed    Spouse name: Not on file  . Number of children: 4  . Years of education: Not on file  . Highest education level: Not on file  Occupational History  . Occupation: retired  Tobacco Use  . Smoking status: Former Research scientist (life sciences)  . Smokeless tobacco: Never Used  Vaping Use  . Vaping Use: Never used  Substance and Sexual Activity  . Alcohol use: Never  . Drug use: Never  . Sexual activity: Not on file    Comment: MARRIED  Other Topics Concern  . Not on file  Social History Narrative  . Not on file   Social Determinants of Health   Financial Resource Strain: Low Risk   . Difficulty of Paying Living Expenses: Not hard at all  Food Insecurity: No Food Insecurity  . Worried About Charity fundraiser in the Last Year: Never true  . Ran Out of Food in the Last Year: Never true  Transportation Needs: No Transportation Needs  . Lack of Transportation (Medical): No  . Lack of Transportation (Non-Medical): No  Physical Activity: Sufficiently Active  . Days of Exercise per Week: 5 days  . Minutes of Exercise per Session: 30 min  Stress: No Stress Concern Present  . Feeling of Stress : Not at all  Social Connections: Moderately Isolated  . Frequency of Communication with Friends and Family: More than three times a week  . Frequency of Social Gatherings with Friends and Family: More than three times a week  . Attends Religious Services: Never  . Active Member of Clubs or Organizations: Yes  . Attends Archivist Meetings: Never  . Marital Status:  Widowed   Vitals:   12/22/19 1130  BP: 118/60  Pulse: 79  Resp: 16  SpO2: 96%   Body mass index is 29.62 kg/m.  Physical Exam Vitals and nursing note reviewed.  Constitutional:      General: She is not in acute distress.    Appearance: She is well-developed.  HENT:     Head: Normocephalic and atraumatic.     Mouth/Throat:     Mouth: Mucous membranes are moist.     Pharynx: Oropharynx is clear.  Eyes:     Conjunctiva/sclera: Conjunctivae normal.     Pupils: Pupils are equal, round, and reactive  to light.  Cardiovascular:     Rate and Rhythm: Normal rate and regular rhythm.     Heart sounds: No murmur heard.      Comments: DP pulses present. Pulmonary:     Effort: Pulmonary effort is normal. No respiratory distress.     Breath sounds: Normal breath sounds.  Abdominal:     Palpations: Abdomen is soft. There is no hepatomegaly or mass.     Tenderness: There is no abdominal tenderness.  Lymphadenopathy:     Cervical: No cervical adenopathy.  Skin:    General: Skin is warm.     Findings: No erythema or rash.  Neurological:     Mental Status: She is alert and oriented to person, place, and time.     Comments: Mildly unstable, not assisted. No significant motor deficit appreciated, decreased sensation right upper and lower extremities.   Psychiatric:     Comments: Well groomed, good eye contact.   ASSESSMENT AND PLAN:  [name] was seen today for  follow-up.  Orders Placed This Encounter  Procedures  . DG Chest 2 View  . Brain Natriuretic Peptide   Lab Results  Component Value Date   CREATININE 0.96 (H) 12/22/2019   BUN 11 12/22/2019   NA 139 12/22/2019   K 4.6 12/22/2019   CL 105 12/22/2019   CO2 27 12/22/2019   SOB (shortness of breath) on exertion Some improvement. Possible etiologies discussed. ? Deconditioning. Lung auscultation negative. CXR to be repeated, recommendations will be given according to results. Instructed about warning  signs.  Essential (primary) hypertension BP is otherwise adequately controlled, DBP on lower normal range. For now we do not need to add Lisinopril. Continue monitoring BP at home. Continue low salt diet.  Unstable gait S/P CVA. Fall precautions.  HFrEF (heart failure with reduced ejection fraction) ([place]) On BB. Will hold on adding ACEI. Continue low salt diet. Follows with cardiologist.  Return if symptoms worsen or fail to improve, for Keep next f/u appt.  Casmer Yepiz G. Martinique, MD  [facility]. [place] office.  A few things to remember from today's visit:   SOB (shortness of breath) on exertion - Plan: Brain Natriuretic Peptide, DG Chest 2 View  Essential (primary) hypertension  If you need refills please call your pharmacy. Do not use My Chart to request refills or for acute issues that need immediate attention.   Depending on labs we will decide about next step, fluid pill will be recommended in lab is abnormal. Monitor for new symptoms.  Please be sure medication list is accurate. If a new problem present, please set up appointment sooner than planned today.

## 2019-12-23 LAB — BASIC METABOLIC PANEL WITH GFR
BUN/Creatinine Ratio: 11 (calc) (ref 6–22)
BUN: 11 mg/dL (ref 7–25)
CO2: 27 mmol/L (ref 20–32)
Calcium: 9.7 mg/dL (ref 8.6–10.4)
Chloride: 105 mmol/L (ref 98–110)
Creat: 0.96 mg/dL — ABNORMAL HIGH (ref 0.60–0.93)
GFR, Est African American: 68 mL/min/{1.73_m2} (ref >=60)
GFR, Est Non African American: 59 mL/min/{1.73_m2} — ABNORMAL LOW (ref >=60)
Glucose, Bld: 100 mg/dL — ABNORMAL HIGH (ref 65–99)
Potassium: 4.6 mmol/L (ref 3.5–5.3)
Sodium: 139 mmol/L (ref 135–146)

## 2019-12-24 ENCOUNTER — Encounter: Payer: Self-pay | Admitting: Family Medicine

## 2019-12-24 ENCOUNTER — Other Ambulatory Visit: Payer: Self-pay | Admitting: *Deleted

## 2019-12-24 NOTE — Patient Outreach (Signed)
[facility]) Care Management  12/24/2019  April Lowery 07-16-1945 [id number]   [facility] Unsuccessful outreach   Outreach attempt to the home number  No answer. [facility] RN CM left HIPAA [facility] Portability and Accountability Act) compliant voicemail message along with CM's contact info.   Plan: [facility] RN CM scheduled this patient for another call attempt within 4-7 business days  [name] L. Lavina Hamman, RN, BSN, [place] Coordinator Office number [phone] Mobile number [phone]  Main [facility] number [phone] Fax number [phone]

## 2019-12-27 ENCOUNTER — Other Ambulatory Visit: Payer: Self-pay

## 2019-12-27 ENCOUNTER — Other Ambulatory Visit (INDEPENDENT_AMBULATORY_CARE_PROVIDER_SITE_OTHER): Payer: Medicare Other

## 2019-12-27 DIAGNOSIS — R0602 Shortness of breath: Secondary | ICD-10-CM

## 2019-12-27 NOTE — Addendum Note (Signed)
Addended by: Rodrigo Ran on: 12/27/2019 10:23 AM   Modules accepted: Orders

## 2019-12-28 ENCOUNTER — Encounter: Payer: Self-pay | Admitting: Family Medicine

## 2019-12-28 ENCOUNTER — Other Ambulatory Visit: Payer: Self-pay | Admitting: Family Medicine

## 2019-12-28 LAB — BRAIN NATRIURETIC PEPTIDE: Brain Natriuretic Peptide: 505 pg/mL — ABNORMAL HIGH (ref <100)

## 2019-12-28 MED ORDER — FUROSEMIDE 20 MG PO TABS
ORAL_TABLET | ORAL | 3 refills | Status: DC
Start: 2019-12-28 — End: 2020-02-23

## 2019-12-31 ENCOUNTER — Other Ambulatory Visit: Payer: Self-pay

## 2019-12-31 ENCOUNTER — Other Ambulatory Visit: Payer: Self-pay | Admitting: *Deleted

## 2019-12-31 NOTE — Patient Outreach (Signed)
[facility]) Care Management  12/31/2019  April Lowery May 25, 1945 [id number]   [facility] outreach for follow toEMMI- strokereferred patient  April Lowery was referred to [facility] on 10/21/19 forRED ON EMMI ALERT Day #1Date:Wednesday 7/28/21Red Alert Reason:Loss of interest, feeling sad? yes The EMMI was resolved on 7/29/21[facility] SW services and she did not prefer [facility] SW referral  Insurance: NextGen Medicareand bankers supplement/colonial penn [facility] admissions x1ED visits x in the last 6 months  Last admission 10/06/19 to 10/08/19 for stroke/TIA   Patient is able to verify HIPAA ([place]) identifiers Reviewed and addressed the purpose of the follow up call with the patient  Consent: [facility] RN CM reviewed [facility] services with patient. Patient gave verbal consent for services.   Follow up Overall April Lowery reports she is doing well with improvements The only medical concern is she still continues to have some shortness of breath (sob) with exertion She has been evaluated by Dr Martinique and prescribed diuretic to assist Fluid in left lung was found but on the repeat x ray it ws resolved/negative  [facility] RN CM assessed and reviewed other reasons for possible sob on exertion Discussed CHF, anemia, pulmonary embolism, other lung diseases, obesity, anxiety  and kidney diseases Pt to monitor in her action plan for GERD, aspiration and weight management  She continues to monitor all her vital signs at home average oxygen saturation is 97-98% cbg 111 BP 108/60  She reports working with her son recently on some home improvement activities and over making efforts to stay active. Her weight (wt) has decreased from 181, 178  To today's 175 lbs She was commended for her hard work   Anemia- Hemoglobin in 11/2019 per Epic was 12.5, she takes B 12 supplements Anxiety assessed and she reports noting anxiety more with  her handing shaking and she stops to rest  [facility] RN CM discussed and demonstrated a simple meditation process of stopping to refocus by asking 3 questions   Pacemaker is reported to be checked more frequently It was being checked every 6 months and the cardiology office is now checking q check 3 months  Transportation concerns resolved  Home and personal safety assessed Sent EMMIs to listed e mail address in [place]- cpr fo adults, anemia caused by low iron, shortness of breath (dyspnea) heart failure, Adult   Plan [facility] RN CM will follow up with April Wareing in 21-28 business days Pt encouraged to return a call to [facility] RN CM prn Routed note to MD Goals Addressed              This Visit's Progress     Patient Stated   .  [facility]) Keep or Improve My Strength (pt-stated)   On track     Follow Up Date 01/21/20   - increase activity or exercise time a little every week - know who to call for help if I fall - learn how to get up if I fall - plan activity for times when energy is the highest    Why is this important?   Before the stroke you probably did not think much about being safe when you are up and about.  Now, it may be harder for you to get around.  It may also be easier for you to trip or fall.  It is common to have muscle weakness after a stroke. You may also feel like you cannot control an arm or  leg.  It will be helpful to work with a physical therapist to get your strength and muscle control back.  It is good to stay as active as you can. Walking and stretching help you stay strong and flexible.  The physical therapist will develop an exercise program just for you.     Notes:     .  [facility]) Monitor and Manage My Blood Sugar (pt-stated)   On track     Follow Up Date 01/21/20   - check blood sugar at prescribed times - check blood sugar if I feel it is too high or too low - take the blood sugar log to all doctor visits - take the blood sugar meter to all doctor visits    Why is  this important?   Checking your blood sugar at home helps to keep it from getting very high or very low.  Writing the results in a diary or log helps the doctor know how to care for you.  Your blood sugar log should have the time, date and the results.  Also, write down the amount of insulin or other medicine that you take.  Other information, like what you ate, exercise done and how you were feeling, will also be helpful.     Notes:     .  COMPLETED: [facility]) Patient will be able to verbalize management of stroke, diabetes and hypertension at home (pt-stated)   On track     [place] (see longtitudinal plan of care for additional care plan information)  Objective:  . Last practice recorded BP readings:  BP Readings from Last 3 Encounters:  12/22/19 118/60  11/10/19 133/64  10/18/19 (!) 120/62 .   Marland Kitchen Most recent eGFR/CrCl: No results found for: EGFR  No components found for: CRCL  Current Barriers:  Marland Kitchen Knowledge deficit related to self care management of hypertension . Cognitive Deficits  Case Manager Clinical Goal(s):  Marland Kitchen Over the next 31 days, patient will not experience hospital admission. Hospital Admissions in last 6 months = 1 11/18/19 met last hospital discharge 10/13/19 . Over the next 90 days, patient will verbalize basic understanding of hypertension disease process and self health management plan as evidenced by BP < 130/80 progressing DBP in 60-70's per pt   12/10/19 progressing -completing pt self care activities Pending tests/imaging from 12/10/19  . Over the next 30 days patient will have access to local transportation or be informed she has been released to drive per neurology - 12/10/19 Goal met  . 12/31/19 Resolving due to duplicate goal  .   Interventions:  . Evaluation of current treatment plan related to hypertension self management and patient's adherence to plan as established by provider. . Reviewed medications with patient and discussed importance of  compliance . Discussed plans with patient for ongoing care management follow up and provided patient with direct contact information for care management team . Advised patient, providing education and rationale, to monitor blood pressure daily and record, calling PCP for findings outside established parameters.  . Reviewed scheduled/upcoming provider appointments including:  . Provided education regarding s/s of stroke and stroke prevention . Discussed signs and symptoms of hypertension . Provided educational material:  EMMI (Weight Loss Tips, Weight Loss Diet, Chair Exercises, Relaxation Techniques, Low Sodium Diet, DASH Diet, Controlling your Blood Pressure through Lifestyle, High Blood Pressure Health Problems, High Blood Pressure Taking Your Blood Pressure, Lowering Your Rick of High Blood Pressure, Stress, etc) . Reviewed preventive care and BE FAST .  Sent via [place] for transportation, support groups and other services  . 12/10/19 assessed for transportation resolution and present symptoms reported to pcp, Reviewed labs, pending cxr, discussed action plan for any increase symptoms   Patient Self Care Activities:  . Self administers medications as prescribed . Attends all scheduled provider appointments . Calls provider office for new concerns, questions, or BP outside discussed parameters . Monitors BP and records as discussed . Adheres to a low sodium diet/DASH diet . Increase physical activity as tolerated . Verbalize where to go to receive medical care  Please see past updates related to this goal by clicking on the "Past Updates" button in the selected goal  Please see other previous [facility] care plan information listed in Epic under the flow sheet section       .  [facility]) Track and Manage Activity and Exertion (pt-stated)        Follow Up Date 01/21/20   - follow activity or exercise plan - pace activity allowing for rest    Why is this important?    Exercising is very important when managing your heart failure.  It will help your heart get stronger.    Notes:     .  [facility]) Track and Manage My Blood Pressure (pt-stated)   On track     Follow Up Date 01/21/20   - check blood pressure weekly - choose a place to take my blood pressure (home, clinic or office, retail store) - write blood pressure results in a log or diary    Why is this important?   You won't feel high blood pressure, but it can still hurt your blood vessels.  High blood pressure can cause heart or kidney problems. It can also cause a stroke.  Making lifestyle changes like losing a little weight or eating less salt will help.  Checking your blood pressure at home and at different times of the day can help to control blood pressure.  If the doctor prescribes medicine remember to take it the way the doctor ordered.  Call the office if you cannot afford the medicine or if there are questions about it.     Notes:     .  [facility]) Track and Manage Symptoms (pt-stated)   On track     Follow Up Date 01/21/20    - eat more whole grains, fruits and vegetables, lean meats and healthy fats - follow rescue plan if symptoms flare-up - know when to call the doctor    Why is this important?   You will be able to handle your symptoms better if you keep track of them.  Making some simple changes to your lifestyle will help.  Eating healthy is one thing you can do to take good care of yourself.    Notes:        Michaelia Beilfuss L. Lavina Hamman, RN, BSN, [place] Coordinator Office number [phone] Main [facility] number [phone] Fax number [phone]

## 2020-01-04 LAB — HM DIABETES EYE EXAM

## 2020-01-18 ENCOUNTER — Other Ambulatory Visit: Payer: Self-pay

## 2020-01-18 ENCOUNTER — Ambulatory Visit (INDEPENDENT_AMBULATORY_CARE_PROVIDER_SITE_OTHER): Payer: Medicare Other | Admitting: Family Medicine

## 2020-01-18 ENCOUNTER — Encounter: Payer: Self-pay | Admitting: Family Medicine

## 2020-01-18 VITALS — BP 118/60 | HR 66 | Temp 97.9°F | Resp 16 | Ht 65.0 in | Wt 178.8 lb

## 2020-01-18 DIAGNOSIS — E1169 Type 2 diabetes mellitus with other specified complication: Secondary | ICD-10-CM | POA: Diagnosis not present

## 2020-01-18 DIAGNOSIS — R2681 Unsteadiness on feet: Secondary | ICD-10-CM

## 2020-01-18 DIAGNOSIS — R0602 Shortness of breath: Secondary | ICD-10-CM

## 2020-01-18 DIAGNOSIS — I255 Ischemic cardiomyopathy: Secondary | ICD-10-CM | POA: Diagnosis not present

## 2020-01-18 DIAGNOSIS — I10 Essential (primary) hypertension: Secondary | ICD-10-CM | POA: Diagnosis not present

## 2020-01-18 DIAGNOSIS — I5020 Unspecified systolic (congestive) heart failure: Secondary | ICD-10-CM

## 2020-01-18 NOTE — Patient Instructions (Addendum)
A few things to remember from today's visit:   SOB (shortness of breath) on exertion  Essential (primary) hypertension - Plan: BASIC METABOLIC PANEL WITH GFR  Unstable gait  HFrEF (heart failure with reduced ejection fraction) ([place]) - Plan: Brain Natriuretic Peptide  Type 2 diabetes mellitus with other specified complication, without long-term current use of insulin (HCC) - Plan: Hemoglobin A1c  No changes today. Will given new recommendations according to lab results.  If you need refills please call your pharmacy. Do not use My Chart to request refills or for acute issues that need immediate attention.    Please be sure medication list is accurate. If a new problem present, please set up appointment sooner than planned today.

## 2020-01-18 NOTE — Progress Notes (Signed)
Chief Complaint  Patient presents with  . Follow-up   HPI: [name] is a pleasant 74 y.o. female, who is here today for follow up.  DM II:Dx'ed 02/11/18.  On non pharmacologic treatments. BS's low 110's.  Negative for abdominal pain, nausea,vomiting, polydipsia,polyuria, or polyphagia.  Lab Results  Component Value Date   HGBA1C 7.0 (H) 10/08/2019   HTN: She has her BP monitor here. Still having some SOB but gradually getting better. Hx CAD,complete heart block s/p pacemaker, and HFrEF. She is on Metoprolol succinate 50 mg daily She is on Furosemide 20 mg daily. BNP was mildly elevated at 505. She took Furosemide 40 mg x 5 days. Negative for orthopnea and PND.  Echo on 10/07/19: LVEF 24-23% and diastolic I dysfunction. Negative for severe/frequent headache,CP,palpitation, or edema.  She needs a handicap sticker. CVA with residual unstable gait.  Lab Results  Component Value Date   CREATININE 0.96 (H) 12/22/2019   BUN 11 12/22/2019   NA 139 12/22/2019   K 4.6 12/22/2019   CL 105 12/22/2019   CO2 27 12/22/2019   Review of Systems  Constitutional: Positive for fatigue. Negative for activity change, appetite change and fever.  HENT: Negative for mouth sores and nosebleeds.   Eyes: Negative for redness and visual disturbance.  Respiratory: Negative for cough and wheezing.   Gastrointestinal: Negative for vomiting.       Negative for changes in bowel habits.  Genitourinary: Negative for decreased urine volume and hematuria.  Musculoskeletal: Positive for gait problem. Negative for myalgias.  Neurological: Negative for syncope, facial asymmetry and weakness.  Rest see pertinent positives and negatives per HPI.  Current Outpatient Medications on File Prior to Visit  Medication Sig Dispense Refill  . acetaminophen (TYLENOL) 650 MG CR tablet Take 650 mg by mouth every 8 (eight) hours as needed for pain.    . cholecalciferol (VITAMIN D3) 25 MCG (1000 UT)  tablet Take 1,000 Units by mouth daily.    . clopidogrel (PLAVIX) 75 MG tablet TAKE 1 TABLET BY MOUTH EVERY DAY 90 tablet 3  . Cyanocobalamin (B-12) 2500 MCG TABS Take 1 tablet by mouth daily.    Marland Kitchen ezetimibe (ZETIA) 10 MG tablet Take 1 tablet (10 mg total) by mouth daily. 30 tablet 11  . furosemide (LASIX) 20 MG tablet 2 tabs daily for 5 days then one tab daily. (Patient not taking: Reported on 01/21/2020) 30 tablet 3  . metoprolol succinate (TOPROL-XL) 50 MG 24 hr tablet TAKE 1 TABLET (50 MG TOTAL) BY MOUTH DAILY. TAKE WITH OR IMMEDIATELY FOLLOWING A MEAL. 90 tablet 2  . omeprazole (PRILOSEC) 20 MG capsule Take 20 mg by mouth as needed (acid reflux).     . rosuvastatin (CRESTOR) 40 MG tablet TAKE 1 TABLET BY MOUTH EVERYDAY AT BEDTIME 90 tablet 3  . sertraline (ZOLOFT) 50 MG tablet Take 1 tablet (50 mg total) by mouth daily. 90 tablet 1   No current facility-administered medications on file prior to visit.   Past Medical History:  Diagnosis Date  . Acute ST segment elevation myocardial infarction ([place])   . Arthritis   . CAD (coronary artery disease) 02/15/2019  . Cancer of left kidney ([place])   . Complete atrioventricular block (HCC)   . DVT (deep venous thrombosis) ([place]) 02/15/2019  . GI bleeding   . 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 HLD (hyperlipidemia) 02/15/2019  . HTN (hypertension) 02/15/2019  . Ischemic cardiomyopathy   . Melena   . Pacemaker   .  Pulmonary thromboembolism ([place]) 02/15/2019   Allergies  Allergen Reactions  . Penicillins Rash   Social History   Socioeconomic History  . Marital status: Widowed    Spouse name: Not on file  . Number of children: 4  . Years of education: Not on file  . Highest education level: Not on file  Occupational History  . Occupation: retired  Tobacco Use  . Smoking status: Former Research scientist (life sciences)  . Smokeless tobacco: Never Used  Vaping Use  . Vaping Use: Never used  Substance and Sexual Activity  . Alcohol use: Never  . Drug  use: Never  . Sexual activity: Not on file    Comment: MARRIED  Other Topics Concern  . Not on file  Social History Narrative  . Not on file   Social Determinants of Health   Financial Resource Strain: Low Risk   . Difficulty of Paying Living Expenses: Not hard at all  Food Insecurity: No Food Insecurity  . Worried About Charity fundraiser in the Last Year: Never true  . Ran Out of Food in the Last Year: Never true  Transportation Needs: No Transportation Needs  . Lack of Transportation (Medical): No  . Lack of Transportation (Non-Medical): No  Physical Activity: Sufficiently Active  . Days of Exercise per Week: 5 days  . Minutes of Exercise per Session: 30 min  Stress: No Stress Concern Present  . Feeling of Stress : Not at all  Social Connections: Moderately Isolated  . Frequency of Communication with Friends and Family: More than three times a week  . Frequency of Social Gatherings with Friends and Family: More than three times a week  . Attends Religious Services: Never  . Active Member of Clubs or Organizations: Yes  . Attends Archivist Meetings: Never  . Marital Status: Widowed   Vitals:   01/18/20 1358  BP: 118/60  Pulse: 66  Resp: 16  Temp: 97.9 F (36.6 C)  SpO2: 98%   Body mass index is 29.75 kg/m.  Physical Exam Vitals and nursing note reviewed.  Constitutional:      General: She is not in acute distress.    Appearance: She is well-developed.  HENT:     Head: Normocephalic and atraumatic.     Mouth/Throat:     Mouth: Mucous membranes are moist.     Pharynx: Oropharynx is clear.  Eyes:     Conjunctiva/sclera: Conjunctivae normal.  Cardiovascular:     Rate and Rhythm: Normal rate and regular rhythm.     Pulses:          Dorsalis pedis pulses are 2+ on the right side and 2+ on the left side.     Heart sounds: No murmur heard.   Pulmonary:     Effort: Pulmonary effort is normal. No respiratory distress.     Breath sounds: Normal breath  sounds.  Abdominal:     Palpations: Abdomen is soft. There is no hepatomegaly or mass.     Tenderness: There is no abdominal tenderness.  Lymphadenopathy:     Cervical: No cervical adenopathy.  Skin:    General: Skin is warm.     Findings: No erythema or rash.  Neurological:     General: No focal deficit present.     Mental Status: She is alert and oriented to person, place, and time.     Deep Tendon Reflexes:     Reflex Scores:      Patellar reflexes are 2+ on the right side  and 2+ on the left side.    Comments: Mildly stable gait, not assisted.  Psychiatric:     Comments: Well groomed, good eye contact.    ASSESSMENT AND PLAN:  April Lowery was seen today for follow-up.  Diagnoses and all orders for this visit:  Orders Placed This Encounter  Procedures  . BASIC METABOLIC PANEL WITH GFR  . Brain Natriuretic Peptide  . Hemoglobin A1c   Lab Results  Component Value Date   HGBA1C 6.8 (H) 01/18/2020   Lab Results  Component Value Date   CREATININE 0.85 01/18/2020   BUN 9 01/18/2020   NA 141 01/18/2020   K 4.3 01/18/2020   CL 104 01/18/2020   CO2 28 01/18/2020    SOB (shortness of breath) on exertion Improving. ? Deconditioning. Regular physical activity as tolerated. Instructed about warning signs.  Essential (primary) hypertension BP adequately controlled. Continue Metoprolol Succinate 50 mg daily. Low salt diet.  Type 2 diabetes mellitus with other specified complication, without long-term current use of insulin (HCC) HgA1C slightly above goal. Continue non pharmacologic treatment. Annual eye exam and good foot care.  Unstable gait Fall precautions. Progressively getting better. Handicap sticker completed and signed.  HFrEF (heart failure with reduced ejection fraction) (HCC) Continue Metoprolol succinate 50 mg daily and Furosemide 20 mg daily.  Low salt intake.   Return in about 5 months (around 06/17/2020) for HTN,DM II.   Amelia Macken G. [place],  MD  [facility]. [place] office.  A few things to remember from today's visit:   SOB (shortness of breath) on exertion  Essential (primary) hypertension - Plan: BASIC METABOLIC PANEL WITH GFR  Unstable gait  HFrEF (heart failure with reduced ejection fraction) ([place]) - Plan: Brain Natriuretic Peptide  Type 2 diabetes mellitus with other specified complication, without long-term current use of insulin (HCC) - Plan: Hemoglobin A1c  No changes today. Will given new recommendations according to lab results.  If you need refills please call your pharmacy. Do not use My Chart to request refills or for acute issues that need immediate attention.    Please be sure medication list is accurate. If a new problem present, please set up appointment sooner than planned today.

## 2020-01-19 LAB — HEMOGLOBIN A1C
Hgb A1c MFr Bld: 6.8 % of total Hgb — ABNORMAL HIGH (ref <5.7)
Mean Plasma Glucose: 148 (calc)
eAG (mmol/L): 8.2 (calc)

## 2020-01-19 LAB — BASIC METABOLIC PANEL WITH GFR
BUN: 9 mg/dL (ref 7–25)
CO2: 28 mmol/L (ref 20–32)
Calcium: 9 mg/dL (ref 8.6–10.4)
Chloride: 104 mmol/L (ref 98–110)
Creat: 0.85 mg/dL (ref 0.60–0.93)
GFR, Est African American: 79 mL/min/{1.73_m2} (ref >=60)
GFR, Est Non African American: 68 mL/min/{1.73_m2} (ref >=60)
Glucose, Bld: 133 mg/dL — ABNORMAL HIGH (ref 65–99)
Potassium: 4.3 mmol/L (ref 3.5–5.3)
Sodium: 141 mmol/L (ref 135–146)

## 2020-01-19 LAB — BRAIN NATRIURETIC PEPTIDE: Brain Natriuretic Peptide: 603 pg/mL — ABNORMAL HIGH (ref <100)

## 2020-01-21 ENCOUNTER — Encounter: Payer: Self-pay | Admitting: Adult Health

## 2020-01-21 ENCOUNTER — Other Ambulatory Visit: Payer: Self-pay | Admitting: *Deleted

## 2020-01-21 ENCOUNTER — Other Ambulatory Visit: Payer: Self-pay

## 2020-01-21 NOTE — Patient Outreach (Signed)
[facility]) Care Management  01/21/2020  ALIEYAH SPADER [birth year] [id number]   [facility] outreach for follow toEMMI- strokereferred patient  Mrs AVIA MERKLEY was referred to [facility] on 10/21/19 forRED ON EMMI ALERT Day #1Date:Wednesday 7/28/21Red Alert Reason:Loss of interest, feeling sad? yes The EMMI was resolved on 7/29/21[facility] SW services and she did not prefer [facility] SW referral  Insurance: NextGen Medicareand bankers supplement/colonial penn [facility] admissions x1ED visits x in the last 6 months  Last admission 10/06/19 to 10/08/19 for stroke/TIA   Patient is able to verify HIPAA ([place]) identifiers Reviewed and addressed the purpose of the follow up call with the patient  Consent: [facility]) RN CM reviewed [place] with patient. Patient gave verbal consent for services.   Follow up Mrs Lufkin reports she is overall doing good  primary care provider (PCP) office visit Had office visit on Tuesday 01/18/20 to follow up on her repeat labs She was noted with elevated  BNP (last 3 results) Recent Labs    12/27/19 1027 01/18/20 1442  BNP 505* 603*  Education was provided on BNP purpose  Maintaining sob  Diabetes  HgA1c went from 7 to 6.8 on 01/18/20   congestive Heart Failure (CHF) Reviewed s/s, home action plan   F/u to pcp in 3 months f/u 03/31/20    Plan [facility] RN CM will follow up with Mrs Holcomb in 78 business days Pt encouraged to return a call to [facility] RN CM prn Routed note to MD Goals      Patient Stated   .  [facility]) Keep or Improve My Strength (pt-stated)      Follow Up Date 02/22/20    - increase activity or exercise time a little every week - know who to call for help if I fall - learn how to get up if I fall - plan activity for times when energy is the highest     Notes:    .  [facility]) Monitor and Manage My Blood Sugar (pt-stated)      Follow Up Date 02/22/20    -  check blood sugar at prescribed times - check blood sugar if I feel it is too high or too low - take the blood sugar log to all doctor visits - take the blood sugar meter to all doctor visits    Notes:    .  [facility]) Track and Manage Activity and Exertion (pt-stated)      Follow Up Date 02/22/20    - follow activity or exercise plan - pace activity allowing for rest   Notes:    .  [facility]) Track and Manage My Blood Pressure (pt-stated)      Follow Up Date 02/22/20    - check blood pressure weekly - choose a place to take my blood pressure (home, clinic or office, retail store) - write blood pressure results in a log or diary   Notes:    .  [facility]) Track and Manage Symptoms (pt-stated)      Follow Up Date 02/22/20    - eat more whole grains, fruits and vegetables, lean meats and healthy fats - follow rescue plan if symptoms flare-up - know when to call the doctor   Notes:       Ausencio Vaden L. Lavina Hamman, RN, BSN, [place] Coordinator Office number [phone] Main [facility] number [phone] Fax number [phone]

## 2020-01-22 ENCOUNTER — Encounter: Payer: Self-pay | Admitting: Family Medicine

## 2020-01-22 ENCOUNTER — Encounter: Payer: Self-pay | Admitting: Adult Health

## 2020-01-24 ENCOUNTER — Encounter: Payer: Self-pay | Admitting: *Deleted

## 2020-01-31 ENCOUNTER — Encounter: Payer: Self-pay | Admitting: Adult Health

## 2020-02-02 NOTE — Telephone Encounter (Signed)
Form signed and placed in out box.  Thank you.

## 2020-02-02 NOTE — Telephone Encounter (Signed)
Handed to MR, Hilda Blades with email to send.

## 2020-02-15 ENCOUNTER — Ambulatory Visit: Payer: Medicare Other | Admitting: Adult Health

## 2020-02-21 ENCOUNTER — Ambulatory Visit: Payer: Medicare Other | Admitting: Adult Health

## 2020-02-21 ENCOUNTER — Encounter: Payer: Self-pay | Admitting: Family Medicine

## 2020-02-22 ENCOUNTER — Other Ambulatory Visit: Payer: Self-pay

## 2020-02-22 ENCOUNTER — Other Ambulatory Visit: Payer: Self-pay | Admitting: *Deleted

## 2020-02-22 NOTE — Patient Outreach (Addendum)
[place] [facility]) Care Management  02/22/2020  April Lowery May 29, 1945 [id number]   [facility] outreach for follow toEMMI- strokereferred patient  [name] was referred to [facility] on 10/21/19 forRED ON EMMI ALERT Day #1Date:Wednesday 7/28/21Red Alert Reason:Loss of interest, feeling sad? yes The EMMI was resolved on 7/29/21[facility] SW services and she did not prefer [facility] SW referral  Insurance: NextGen Medicareand bankers supplement/colonial penn [facility] admissions x1ED visits x in the last 6 months  Last admission 10/06/19 to 10/08/19 for stroke/TIA   Patient is able to verify HIPAA ([place] and [place]) identifiers Reviewed and addressed the purpose of the follow up call with the patient  Consent: [facility]) RN CM reviewed [place] with patient. Patient gave verbal consent for services.   Followup [name] reports she is overall doing very good She is presently in [place], [place] with family and it has lifted her spirits   Still some shortness of breath (sob) but reports it is a lot better She has been taking her lasix as ordered Her new lasix order had been changed from once to twice a day She ran out of Lasix on 121/29/21 but returns home to [facility] on 12/1//21 where she has filled Lasix prescription She has been communicating with her primary care provider (PCP) staff via mychart this week related to this prescription need  She was cautioned to monitor for sodium and fluid intake until she is able to take her prescribed Lasix on She is going to visit family in [place] in December and is very excited about this  Her son and daughter in law did get to visit her daughter in [place] ill father in [name]  [name] is very Patent attorney of the outreach from [place] RN CM will follow up with [name] within the next 45-60 business days as agreed not unless she had care coordination issues that need  to be address prior to that Pt encouraged to return a call to [facility] RN CM prn   Dennies Coate L. Lavina Hamman, RN, BSN, [place] Coordinator Office number [phone] Main [facility] number [phone] Fax number [phone]

## 2020-02-23 ENCOUNTER — Other Ambulatory Visit: Payer: Self-pay | Admitting: Family Medicine

## 2020-02-23 MED ORDER — FUROSEMIDE 20 MG PO TABS
20.0000 mg | ORAL_TABLET | Freq: Two times a day (BID) | ORAL | 1 refills | Status: DC
Start: 2020-02-23 — End: 2020-03-30

## 2020-02-24 ENCOUNTER — Encounter: Payer: Self-pay | Admitting: Adult Health

## 2020-02-24 ENCOUNTER — Ambulatory Visit (INDEPENDENT_AMBULATORY_CARE_PROVIDER_SITE_OTHER): Payer: Medicare Other | Admitting: Adult Health

## 2020-02-24 ENCOUNTER — Other Ambulatory Visit: Payer: Self-pay

## 2020-02-24 VITALS — BP 138/67 | HR 72 | Ht 65.0 in | Wt 178.0 lb

## 2020-02-24 DIAGNOSIS — I10 Essential (primary) hypertension: Secondary | ICD-10-CM | POA: Diagnosis not present

## 2020-02-24 DIAGNOSIS — I639 Cerebral infarction, unspecified: Secondary | ICD-10-CM

## 2020-02-24 DIAGNOSIS — E785 Hyperlipidemia, unspecified: Secondary | ICD-10-CM | POA: Diagnosis not present

## 2020-02-24 DIAGNOSIS — I69398 Other sequelae of cerebral infarction: Secondary | ICD-10-CM

## 2020-02-24 DIAGNOSIS — E119 Type 2 diabetes mellitus without complications: Secondary | ICD-10-CM

## 2020-02-24 DIAGNOSIS — I6523 Occlusion and stenosis of bilateral carotid arteries: Secondary | ICD-10-CM

## 2020-02-24 DIAGNOSIS — R209 Unspecified disturbances of skin sensation: Secondary | ICD-10-CM

## 2020-02-24 NOTE — Progress Notes (Signed)
I agree with the above plan 

## 2020-02-24 NOTE — Progress Notes (Signed)
[facility] [address] [place]. [place] [id number] [phone]       STROKE FOLLOW UP NOTE  [name] Date of Birth:  02-Oct-1945 Medical Record Number:  [id number]   Reason for Referral: stroke follow up    SUBJECTIVE:   CHIEF COMPLAINT:  Chief Complaint  Patient presents with  . Cerebrovascular Accident    right leg and heel pain (pins and needles)    HPI:   Today, 02/24/2020, April Lowery returns for 49-month stroke follow-up unaccompanied.  Residual deficits of right arm and leg numbness with leg paresthesias, and imbalance progressively improving. Leg pain worse the following day after prolonged activity. Topical cream with temporary benefit.  Denies new stroke/TIA symptoms.  Remains on aspirin, Plavix, Crestor and Zetia for secondary stroke prevention and history of CAD without side effects.  Blood pressure today 138/67. Stable at home.  Recent A1c 6.8 (down from 7.0) continuing nonpharmacological treatment per PCP. SOB c/o with improvement of lasix.  No concerns at this time.   History provided for reference purposes only Initial visit 11/10/2019: April Lowery is being seen for hospital follow-up accompanied by her sister Residual deficits right-sided numbness with sensory impairment, gait instability and occasional vertigo She does report great improvement since discharge.  Recently started working with PT in [place].  Ambulating without assistive device for short distance but use of rolling walker for long distance She questions return to driving as well as going on planned trip to [place] in October Continues on aspirin 325 mg daily and Plavix 75 mg daily without bleeding or bruising Continues on Crestor 40 mg daily and Zetia without side effects Blood pressure today satisfactory at 133/64 Continues to follow with PCP for HTN, HLD and DM management No further concerns at this time  Stroke admission 10/06/2019 April Lowery is a 74 y.o. female  w/pmh of PE, HTN, HLD, DVT, CAD who presented on 10/06/2019 with gait instability and decreased sensation to the right leg and right arm.  Presented 2 days prior with dizziness with MRI negative and discharged home.  Stroke work-up revealed left superior medulla and cerebellar stroke likely secondary to large vessel disease in setting of left VA occlusion.  CTA head/neck also showed right VA mild to moderate stenosis, left ICA 50% stenosis and bilateral siphon moderate arthrosclerosis.  2D echo EF 40 to 45%.  PTA aspirin 81 mg daily and Plavix for history of CAD and recommended increasing aspirin to 325 mg daily in addition to Plavix for 3 months then PTA dosing.  History of HTN stable.  History of HLD with LDL 71 recommend continuation of Crestor 20 mg daily.  No prior history of DM with new diagnosis of DM during admission with A1c 7.0. 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  Evaluated by therapy and recommended PT and discharged home in stable condition.  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 Acute ST segment elevation myocardial infarction ([place])   . Arthritis   . CAD (coronary artery disease) 02/15/2019  . Cancer  of left kidney ([place])   . Complete atrioventricular block (HCC)   . DVT (deep venous thrombosis) ([place]) 02/15/2019  . GI bleeding   . 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 HLD (hyperlipidemia) 02/15/2019  . HTN (hypertension) 02/15/2019  . Ischemic cardiomyopathy   . Melena   . Pacemaker   . Pulmonary thromboembolism ([place]) 02/15/2019    PSH:  Past Surgical History:  Procedure Laterality Date  . CHOLECYSTECTOMY    . CORONARY ARTERY BYPASS GRAFT     STENTS  . PACEMAKER INSERTION    . TONSILLECTOMY AND ADENOIDECTOMY  2017    Social History:  Social History   Socioeconomic History  . Marital status: Widowed    Spouse name: Not on file  . Number of children: 4  . Years of education: Not on file  . Highest education level: Not on file  Occupational History  . Occupation: retired  Tobacco Use  . Smoking status: Former Research scientist (life sciences)  . Smokeless tobacco: Never Used  Vaping Use  . Vaping Use: Never used  Substance and Sexual Activity  . Alcohol use: Never  . Drug use: Never  . Sexual activity: Not on file    Comment: MARRIED  Other Topics Concern  . Not on file  Social History Narrative  . Not on file   Social Determinants of Health   Financial Resource Strain: Low Risk   . Difficulty of Paying Living Expenses: Not hard at all  Food Insecurity: No Food Insecurity  . Worried About Charity fundraiser in the Last Year: Never true  . Ran Out of Food in the Last Year: Never true  Transportation Needs: No Transportation Needs  . Lack of Transportation (Medical): No  . Lack of Transportation (Non-Medical): No  Physical Activity: Sufficiently Active  . Days of Exercise per Week: 5 days  . Minutes of Exercise per Session: 30 min  Stress: No Stress Concern Present  . Feeling of Stress : Not at all  Social Connections: Moderately Isolated  . Frequency of Communication with Friends and Family: More than three times a week  . Frequency of Social Gatherings with Friends and  Family: More than three times a week  . Attends Religious Services: Never  . Active Member of Clubs or Organizations: Yes  . Attends Archivist Meetings: Never  . Marital Status: Widowed  Intimate Partner Violence: Not At Risk  . Fear of Current or Ex-Partner: No  . Emotionally Abused: No  . Physically Abused: No  . Sexually Abused: No    Family History:  Family History  Problem Relation Age of Onset  . Heart disease Mother   . Heart attack Mother   . Heart disease Father   . Heart attack Father   . Heart disease Brother   . Heart attack Brother   . Heart attack Maternal Grandmother   . Heart attack Maternal Grandfather   . Heart attack Paternal Grandmother   . Heart attack Paternal Grandfather     Medications:   Current Outpatient Medications on File Prior to Visit  Medication Sig Dispense Refill  . acetaminophen (TYLENOL) 650 MG CR tablet Take 650 mg by mouth every 8 (eight) hours as needed for  pain.    . cholecalciferol (VITAMIN D3) 25 MCG (1000 UT) tablet Take 1,000 Units by mouth daily.    . clopidogrel (PLAVIX) 75 MG tablet TAKE 1 TABLET BY MOUTH EVERY DAY 90 tablet 3  . Cyanocobalamin (B-12) 2500 MCG TABS Take 1 tablet by mouth daily.    Mar[place] Kitchen ezetimibe (ZETIA) 10 MG tablet Take 1 tablet (10 mg total) by mouth daily. 30 tablet 11  . furosemide (LASIX) 20 MG tablet Take 1 tablet (20 mg total) by mouth 2 (two) times daily. 2 tabs daily for 5 days then one tab daily. 180 tablet 1  . metoprolol succinate (TOPROL-XL) 50 MG 24 hr tablet TAKE 1 TABLET (50 MG TOTAL) BY MOUTH DAILY. TAKE WITH OR IMMEDIATELY FOLLOWING A MEAL. 90 tablet 2  . omeprazole (PRILOSEC) 20 MG capsule Take 20 mg by mouth as needed (acid reflux).     . rosuvastatin (CRESTOR) 40 MG tablet TAKE 1 TABLET BY MOUTH EVERYDAY AT BEDTIME 90 tablet 3  . sertraline (ZOLOFT) 50 MG tablet Take 1 tablet (50 mg total) by mouth daily. 90 tablet 1   No current facility-administered medications on file prior to  visit.    Allergies:   Allergies  Allergen Reactions  . Penicillins Rash      OBJECTIVE:  Physical Exam  Vitals:   02/24/20 0805  BP: 138/67  Pulse: 72  Weight: 178 lb (80.7 kg)  Height: 5\' 5"  (1.651 m)   Body mass index is 29.62 kg/m. No exam data present  General: well developed, well nourished, very pleasant elderly Caucasian female, seated, in no evident distress Head: head normocephalic and atraumatic.   Neck: supple with no carotid or supraclavicular bruits Cardiovascular: regular rate and rhythm, no murmurs Musculoskeletal: no deformity Skin:  no rash/petichiae Vascular:  Normal pulses all extremities   Neurologic Exam Mental Status: Awake and fully alert.   Fluent speech and language.  Oriented to place and time. Recent and remote memory intact. Attention span, concentration and fund of knowledge appropriate. Mood and affect appropriate.  Cranial Nerves: Pupils equal, briskly reactive to light. Extraocular movements full without nystagmus. Visual fields full to confrontation. Hearing intact. Facial sensation intact. Face, tongue, palate moves normally and symmetrically.  Motor: Normal bulk and tone. Normal strength in all tested extremity muscles. Sensory.:  Decreased sensory RLE temperature, vibration and pinprick and subjective paresthesias RLE distally Coordination: Rapid alternating movements normal in all extremities. Finger-to-nose and heel-to-shin mild right-sided ataxia. Gait and Station: Arises from chair without difficulty. Stance is normal. Gait demonstrates normal stride length and balance without use of assistive device.  Difficulty performing tandem walk and heel toe walk.  Romberg negative Reflexes: 1+ and symmetric. Toes downgoing.          ASSESSMENT: April Lowery is a 74 y.o. year old female presented with dizziness, blurred vision and right-sided weakness/numbness on 10/04/2019 with initial MRI negative but then returned with reonset of  right-sided numbness on 10/06/2019 with repeat MRI left superior medulla and cerebellar stroke likely secondary to large vessel disease with CTA showing left VA occlusion and mild to moderate right VA stenosis. Vascular risk factors include large vessel stenosis, chronic left cerebellar infarct on imaging, HTN, HLD, new onset DM, history of PE/DVT, CAD s/p stent and CHF s/p pacemaker.      PLAN:  1. Left medulla and cerebellar stroke:  a. Residual deficit: Progressive improvement with main complaint RLE paresthesias and occasional imbalance.  Provided education regarding poststroke sensory impairment and exercises  to do at home.  She declines interest in medication management currently and plans on continuing to use OTC topical cream as needed b. Continue aspirin 81 mg daily and clopidogrel 75 mg daily (hx of CAD) and Crestor 40 mg daily and Zetia for secondary stroke prevention. c. Discussed secondary stroke prevention measures and importance of close PCP follow up for aggressive stroke risk factor management  2. B/l carotid stenosis:  a. CTA R ICA 25% stenosis and L ICA 50% stenosis b. Remains asymptomatic continuing on DAPT and statin.   c. Plan on obtaining carotid duplex after follow-up visit (approx 1 year from prior imaging) 3. HTN:  a. BP goal <130/90.  Stable on metoprolol per PCP 4. HLD:  a. LDL goal <70. Recent LDL 71.  On Crestor and Zetia per PCP 5. New dx DMII:  a. A1c goal<7.0. Recent A1c 6.8 down from 7.0.  Currently diet controlled per PCP    Follow up in 6 months or call earlier if needed   CC:  [place] provider: Dr. Sethi [place], Malka So, MD    I spent 30 minutes of face-to-face and non-face-to-face time with patient.  This included previsit chart review, lab review, study review, order entry, electronic health record documentation, patient education regarding stroke, residual deficits, importance of managing stroke risk factors and answered all other questions to patient  satisfaction  Frann Rider, [facility]  [facility] [address] [place], [place] [address]  Phone [phone] Fax [phone] Note: This document was prepared with digital dictation and possible smart phrase technology. Any transcriptional errors that result from this process are unintentional.

## 2020-02-24 NOTE — Patient Instructions (Signed)
Continue use of topical ointment for right leg pain - if becomes worse or not tolerable, please call office  Continue aspirin 81 mg daily and clopidogrel 75 mg daily  and Crestor and Zetia  for secondary stroke prevention  Continue to follow up with PCP regarding cholesterol, diabetes and blood pressure management  Maintain strict control of hypertension with blood pressure goal below 130/90, diabetes with hemoglobin A1c goal below 7% and cholesterol with LDL cholesterol (bad cholesterol) goal below 70 mg/dL.       Followup in the future with me in 6 months or call earlier if needed       Thank you for coming to see [place] at [facility]. I hope we have been able to provide you high quality care today.  You may receive a patient satisfaction survey over the next few weeks. We would appreciate your feedback and comments so that we may continue to improve ourselves and the health of our patients.   Sensory Loss After a Stroke A stroke can damage parts of your brain that control your body's normal functions, including your senses. As a result, you may have sensory loss, such as trouble seeing, tasting, swallowing, or feeling touch or pressure sensations. You may have problems feeling temperature changes or moving your body in a coordinated way. You may also perceive smell differently. You may have problems with all of your senses or only some of them. By following a treatment plan, you may recover lost senses and manage the changes to your lifestyle. What are treatment therapies for sensory loss? You may have a combination of therapies for sensory loss.  Physical therapy. This may include: ? Exercises to improve your coordination and balance. ? Exercises that combine touch, balance, and movement (sensorimotor training). ? Movements to relieve pressure while you are sitting or lying (mobility training). ? Splints or braces to protect parts of your body that you cannot  feel.  Devices to help with blood flow (circulation) and to help stimulate nerves in affected parts of your body.  Speech therapy to help you swallow safely.  Occupational therapy to help you with everyday tasks. This may include: ? Exercises or devices to improve your vision. ? Exercises to help you increase your touch perception. These may include feeling objects of different sizes and textures while your eyes are closed. ? Techniques for moving safely in your environment.  Prescription eye glasses for vision loss.  Hearing aids for hearing loss. Follow these instructions at home: Safety   Your risk of falling is higher after a stroke. You may have difficulty feeling your legs and feet or coordinating your movements. To lower your risk of falling: ? Use devices to help you move around (assistive devices), such as a wheelchair or walker, as directed by your health care team. ? Wear prescription eye glasses at all times when moving around. ? Use lights to help you see in the dark. ? Use grab bars in bathrooms and handrails in stairways. ? Keep walkways clear in your home by removing rugs, cords, and clutter from the floor.  Your risk of getting burned is higher after a stroke. To lower your risk of burns: ? Test the water temperature before taking a bath or washing your hands. ? Allow hot foods to cool slightly before eating. ? Use potholders when handling hot pans.  When using sharp objects, such as scissors or knives, use your healthy hand. Do not handle sharp objects with your hand that  was affected by your stroke, if this applies. Activity  Return to your normal activities as told by your health care provider. Ask your health care provider what activities are safe for you.  Avoid spending too much time sitting or lying down. If you must be in a chair or bed, change positions regularly.  You may have problems doing your normal activities. Ask your health care provider about  getting extra help at home. Eating and drinking   You may have problems swallowing food and fluids after a stroke. The problems can be due to: ? Changes in your muscles. ? Sensory changes, such as:  Difficulty feeling the consistency or size of a piece of food in your mouth.  Inability to feel the need to clear your throat.  You may need to: ? Take smaller bites and chew thoroughly. Make sure you have swallowed all the food in your mouth before you take another bite. ? Sit in an upright position when eating or drinking. ? Avoid distractions while eating or drinking. ? Stay upright for 30-45 minutes after eating. ? Change the texture of some things that you eat and drink. This may include:  Changing foods to a smooth, mashed consistency (puree).  Thickening liquids.  Follow instructions from your health care provider about eating and drinking restrictions. General instructions  Wear arm or leg braces as told by your health care team.  Get help at home as needed. You may need help getting dressed, bathing, using the bathroom, eating, or doing other activities.  Keep all follow-up visits as told by your health care providers. This is important. Summary  It is common to have sensory loss after a stroke. Sensory loss means that you have problems with some or all of your senses, such as vision, taste, hearing, smell, and touch.  Sensory loss happens because of damage to your brain and nervous system after a stroke.  Treatment for sensory loss may include physical, occupational, or speech therapy, and the use of assistive devices.  You may need to make changes to your home and lifestyle after a stroke to help you live safely and independently. This information is not intended to replace advice given to you by your health care provider. Make sure you discuss any questions you have with your health care provider. Document Revised: 07/03/2018 Document Reviewed: 06/28/2016 Elsevier  Patient Education  [place].

## 2020-03-07 ENCOUNTER — Ambulatory Visit (INDEPENDENT_AMBULATORY_CARE_PROVIDER_SITE_OTHER): Payer: Medicare Other

## 2020-03-07 DIAGNOSIS — I255 Ischemic cardiomyopathy: Secondary | ICD-10-CM

## 2020-03-07 LAB — CUP PACEART REMOTE DEVICE CHECK
Battery Remaining Longevity: 151 mo
Battery Voltage: 3.03 V
Brady Statistic AP VP Percent: 6.86 %
Brady Statistic AP VS Percent: 10.08 %
Brady Statistic AS VP Percent: 16.76 %
Brady Statistic AS VS Percent: 66.29 %
Brady Statistic RA Percent Paced: 16.97 %
Brady Statistic RV Percent Paced: 23.62 %
Date Time Interrogation Session: 20211214033548
Implantable Lead Implant Date: 20191121
Implantable Lead Implant Date: 20191121
Implantable Lead Location: 753859
Implantable Lead Location: 753860
Implantable Lead Model: 4076
Implantable Lead Model: 4076
Implantable Pulse Generator Implant Date: 20191121
Lead Channel Impedance Value: 342 Ohm
Lead Channel Impedance Value: 399 Ohm
Lead Channel Impedance Value: 475 Ohm
Lead Channel Impedance Value: 475 Ohm
Lead Channel Pacing Threshold Amplitude: 0.75 V
Lead Channel Pacing Threshold Amplitude: 0.75 V
Lead Channel Pacing Threshold Pulse Width: 0.4 ms
Lead Channel Pacing Threshold Pulse Width: 0.4 ms
Lead Channel Sensing Intrinsic Amplitude: 2.5 mV
Lead Channel Sensing Intrinsic Amplitude: 2.5 mV
Lead Channel Sensing Intrinsic Amplitude: 26.25 mV
Lead Channel Sensing Intrinsic Amplitude: 26.25 mV
Lead Channel Setting Pacing Amplitude: 1.5 V
Lead Channel Setting Pacing Amplitude: 2 V
Lead Channel Setting Pacing Pulse Width: 0.4 ms
Lead Channel Setting Sensing Sensitivity: 1.2 mV

## 2020-03-22 NOTE — Progress Notes (Signed)
Remote pacemaker transmission.   

## 2020-03-24 ENCOUNTER — Other Ambulatory Visit: Payer: Self-pay | Admitting: Family Medicine

## 2020-03-26 ENCOUNTER — Other Ambulatory Visit: Payer: Self-pay | Admitting: Family Medicine

## 2020-03-29 ENCOUNTER — Other Ambulatory Visit: Payer: Self-pay

## 2020-03-29 ENCOUNTER — Other Ambulatory Visit: Payer: Self-pay | Admitting: *Deleted

## 2020-03-29 NOTE — Patient Outreach (Addendum)
[facility]) Care Management  03/29/2020  April Lowery Jan 11, 1946 [id number]  [facility] outreach for follow toEMMI- strokereferred patient  April Lowery was referred to [facility] on 10/21/19 forRED ON EMMI ALERT Day #1Date:Wednesday 7/28/21Red Alert Reason:Loss of interest, feeling sad? yes The EMMI was resolved on 7/29/21[facility] SW services and she did not prefer [facility] SW referral  Insurance: NextGen Medicareand bankers supplement/colonial penn [facility] admissions x1ED visits x in the last 6 months  Last admission 10/06/19 to 10/08/19 for stroke/TIA   Patient is able to verify HIPAA ([place] and [place]) identifiers Reviewed and addressed the purpose of the follow up call with the patient  Consent: [facility]) RN CM reviewed [place] with patient. Patient gave verbal consent for services.   Followup April Lowery reports she is overall doing very good She is presently at sister's in [place]  She will return to [place] in 1-2 weeks for follow up appointments with pcp on 04/03/20 and neurology 08/24/20  Still some shortness of breath (sob) but reports it is a lot better She continues taking her lasix as ordered twice a day, rest as needed and cluster her activities She is managing her strength and symptoms well  Continue to monitor her CBG and BP   She denies worsening issues with diabetes or hypertension Resolved symptom exacerbation CHF issues & activity tolerance issues  She denies any care coordination or disease management needs  April Lowery is very appreciative of the outreach from [place] Patient agrees to care plan and follow up [facility] RN CM will follow up with April Lowery within the next 90-100 business days to assess for any further care coordination or disease management needs and assess for health coach services  Pt encouraged to return a call to [facility] RN CM prn Routed note to MD Goals Addressed               This Visit's Progress     Patient Stated   .  COMPLETED: [facility]) Keep or Improve My Strength (pt-stated)   On track     Follow Up Date 03/29/20   - increase activity or exercise time a little every week - know who to call for help if I fall - learn how to get up if I fall - plan activity for times when energy is the highest    Notes:     .  [facility]) Monitor and Manage My Blood Sugar (pt-stated)   On track     Follow Up Date 06/27/20   - check blood sugar at prescribed times - check blood sugar if I feel it is too high or too low - take the blood sugar log to all doctor visits - take the blood sugar meter to all doctor visits    Notes: 03/29/20 continues to be managed at home well    .  COMPLETED: [facility]) Track and Manage Activity and Exertion (pt-stated)   On track     Follow Up Date 03/29/20  - follow activity or exercise plan - pace activity allowing for rest    Notes: 03/29/20 met goal     .  [facility]) Track and Manage My Blood Pressure (pt-stated)   On track     Follow Up Date 06/27/20   - check blood pressure weekly - choose a place to take my blood pressure (home, clinic or office, retail store) - write blood pressure results in a log or  diary    Notes: 03/29/20 being managed well at  home     .  COMPLETED: [facility]) Track and Manage Symptoms (pt-stated)   On track     Follow Up Date 03/29/20    - eat more whole grains, fruits and vegetables, lean meats and healthy fats - follow rescue plan if symptoms flare-up - know when to call the doctor      Notes: 03/29/20 met goals         Joelene Millin L. Lavina Hamman, RN, BSN, [place] Coordinator Office number [phone] Main [facility] number [phone] Fax number [phone]

## 2020-03-31 ENCOUNTER — Ambulatory Visit: Payer: Medicare Other | Admitting: Family Medicine

## 2020-04-03 ENCOUNTER — Ambulatory Visit (INDEPENDENT_AMBULATORY_CARE_PROVIDER_SITE_OTHER): Payer: Medicare Other | Admitting: Family Medicine

## 2020-04-03 ENCOUNTER — Other Ambulatory Visit: Payer: Self-pay

## 2020-04-03 ENCOUNTER — Encounter: Payer: Self-pay | Admitting: Family Medicine

## 2020-04-03 VITALS — BP 100/60 | HR 93 | Resp 16 | Ht 65.0 in | Wt 174.0 lb

## 2020-04-03 DIAGNOSIS — I5020 Unspecified systolic (congestive) heart failure: Secondary | ICD-10-CM

## 2020-04-03 DIAGNOSIS — Z1159 Encounter for screening for other viral diseases: Secondary | ICD-10-CM

## 2020-04-03 DIAGNOSIS — E1169 Type 2 diabetes mellitus with other specified complication: Secondary | ICD-10-CM

## 2020-04-03 DIAGNOSIS — I10 Essential (primary) hypertension: Secondary | ICD-10-CM

## 2020-04-03 DIAGNOSIS — R42 Dizziness and giddiness: Secondary | ICD-10-CM

## 2020-04-03 DIAGNOSIS — E538 Deficiency of other specified B group vitamins: Secondary | ICD-10-CM

## 2020-04-03 LAB — BASIC METABOLIC PANEL
BUN: 20 mg/dL (ref 6–23)
CO2: 30 mEq/L (ref 19–32)
Calcium: 9.7 mg/dL (ref 8.4–10.5)
Chloride: 100 mEq/L (ref 96–112)
Creatinine, Ser: 0.99 mg/dL (ref 0.40–1.20)
GFR: 56.9 mL/min — ABNORMAL LOW (ref >60.00)
Glucose, Bld: 141 mg/dL — ABNORMAL HIGH (ref 70–99)
Potassium: 3.5 mEq/L (ref 3.5–5.1)
Sodium: 139 mEq/L (ref 135–145)

## 2020-04-03 LAB — VITAMIN B12: Vitamin B-12: 1154 pg/mL — ABNORMAL HIGH (ref 211–911)

## 2020-04-03 LAB — BRAIN NATRIURETIC PEPTIDE: Pro B Natriuretic peptide (BNP): 191 pg/mL — ABNORMAL HIGH (ref 0.0–100.0)

## 2020-04-03 NOTE — Assessment & Plan Note (Signed)
Continue B12 2000 mcg daily. Further recommendations according to B12 result.

## 2020-04-03 NOTE — Assessment & Plan Note (Signed)
Continue Furosemide 20 mg bid and Metoprolol succinate. Further recommendations according to BNP result.

## 2020-04-03 NOTE — Assessment & Plan Note (Signed)
Problem has been well controlled. Not yet due for HgA1C. Continue non pharmacologic treatment.

## 2020-04-03 NOTE — Progress Notes (Signed)
HPI: April Lowery is a 75 y.o. female, who is here today for follow up.   She was last seen on 01/18/20.  Since her last visit she has seen neurologist. 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 Intermittent episodes of lightheadedness after CVD, gradually getting better. She is not using assistance devise. She is on Plavix 75 mg daily.  HFrEF: BNP has been mildly elevated. No orthopnea or PND. Dyspnea reported in the past improved. She is on Furosemide 20 mg bid. Echo 10/07/19: LVEF 40-45%. Grade I diastolic dysfunction.  DM2: Dx'ed in 01/2018. Currently she is on nonpharmacologic treatment. Negative for polydipsia,polyuria, or polyphagia.  Lab Results  Component Value Date   HGBA1C 6.8 (H) 01/18/2020   Hypertension and CAD: Currently she is on metoprolol succinate 50 mg daily. Negative for severe/frequent headache, visual changes, chest pain, palpitation, focal weakness, or edema.  Component     Latest Ref Rng & Units 01/18/2020  Sodium     135 - 145 mEq/L 141  Potassium     3.5 - 5.1 mEq/L 4.3  Chloride     96 - 112 mEq/L 104  CO2     19 - 32 mEq/L 28  Glucose     70 - 99 mg/dL 133 (H)  BUN     6 - 23 mg/dL 9  Creatinine     0.40 - 1.20 mg/dL 0.85  GFR     >60.00 mL/min   Calcium     8.4 - 10.5 mg/dL 9.0   Hyperlipidemia: She is on Crestor 40 mg daily.  Lab Results  Component Value Date   CHOL 143 10/08/2019   HDL 35 (L) 10/08/2019   LDLCALC 71 10/08/2019   TRIG 183 (H) 10/08/2019   CHOLHDL 4.1 10/08/2019   B12 deficiency: She is on B12 supplementation, 2000 mcg daily.  Review of Systems  Constitutional: Negative for activity change, appetite change and fever.  HENT: Negative for mouth sores, nosebleeds and sore throat.   Respiratory: Negative for cough and wheezing.   Gastrointestinal: Negative for abdominal pain, nausea and vomiting.       Negative for changes in bowel habits.  Genitourinary: Negative for decreased urine  volume and hematuria.  Neurological: Negative for syncope, facial asymmetry and weakness.  Rest of ROS, see pertinent positives sand negatives in HPI  Current Outpatient Medications on File Prior to Visit  Medication Sig Dispense Refill  . acetaminophen (TYLENOL) 650 MG CR tablet Take 650 mg by mouth every 8 (eight) hours as needed for pain.    Marland Kitchen aspirin EC 81 MG tablet Take 81 mg by mouth daily. Swallow whole.    . cholecalciferol (VITAMIN D3) 25 MCG (1000 UT) tablet Take 1,000 Units by mouth daily.    . clopidogrel (PLAVIX) 75 MG tablet TAKE 1 TABLET BY MOUTH EVERY DAY 90 tablet 3  . Cyanocobalamin (B-12) 2500 MCG TABS Take 1 tablet by mouth daily.    Marland Kitchen ezetimibe (ZETIA) 10 MG tablet Take 1 tablet (10 mg total) by mouth daily. 30 tablet 11  . furosemide (LASIX) 20 MG tablet TAKE 2 TABLETS BY MOUTH DAILY FOR 5 DAYS THEN 1 TABLET DAILY 90 tablet 1  . metoprolol succinate (TOPROL-XL) 50 MG 24 hr tablet TAKE 1 TABLET (50 MG TOTAL) BY MOUTH DAILY. TAKE WITH OR IMMEDIATELY FOLLOWING A MEAL. 90 tablet 2  . omeprazole (PRILOSEC) 20 MG capsule Take 20 mg by mouth as needed (acid reflux).     . rosuvastatin (CRESTOR)  40 MG tablet TAKE 1 TABLET BY MOUTH EVERYDAY AT BEDTIME 90 tablet 3  . sertraline (ZOLOFT) 50 MG tablet TAKE 1 TABLET BY MOUTH EVERY DAY 30 tablet 0   No current facility-administered medications on file prior to visit.     Past Medical History:  Diagnosis Date  . Acute ST segment elevation myocardial infarction ([place])   . Arthritis   . CAD (coronary artery disease) 02/15/2019  . Cancer of left kidney ([place])   . Complete atrioventricular block (HCC)   . DVT (deep venous thrombosis) ([place]) 02/15/2019  . GI bleeding   . 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 HLD (hyperlipidemia) 02/15/2019  . HTN (hypertension) 02/15/2019  . Ischemic cardiomyopathy   . Melena   . Pacemaker   . Pulmonary thromboembolism ([place]) 02/15/2019   Allergies  Allergen Reactions  . Penicillins Rash     Social History   Socioeconomic History  . Marital status: Widowed    Spouse name: Not on file  . Number of children: 4  . Years of education: Not on file  . Highest education level: Not on file  Occupational History  . Occupation: retired  Tobacco Use  . Smoking status: Former Research scientist (life sciences)  . Smokeless tobacco: Never Used  Vaping Use  . Vaping Use: Never used  Substance and Sexual Activity  . Alcohol use: Never  . Drug use: Never  . Sexual activity: Not on file    Comment: MARRIED  Other Topics Concern  . Not on file  Social History Narrative  . Not on file   Social Determinants of Health   Financial Resource Strain: Low Risk   . Difficulty of Paying Living Expenses: Not hard at all  Food Insecurity: No Food Insecurity  . Worried About Charity fundraiser in the Last Year: Never true  . Ran Out of Food in the Last Year: Never true  Transportation Needs: No Transportation Needs  . Lack of Transportation (Medical): No  . Lack of Transportation (Non-Medical): No  Physical Activity: Sufficiently Active  . Days of Exercise per Week: 5 days  . Minutes of Exercise per Session: 30 min  Stress: No Stress Concern Present  . Feeling of Stress : Not at all  Social Connections: Moderately Isolated  . Frequency of Communication with Friends and Family: More than three times a week  . Frequency of Social Gatherings with Friends and Family: More than three times a week  . Attends Religious Services: Never  . Active Member of Clubs or Organizations: Yes  . Attends Archivist Meetings: Never  . Marital Status: Widowed    Vitals:   04/03/20 0910  BP: 100/60  Pulse: 93  Resp: 16  SpO2: 98%   Body mass index is 28.96 kg/m.  Physical Exam Vitals and nursing note reviewed.  Constitutional:      General: She is not in acute distress.    Appearance: She is well-developed.  HENT:     Head: Normocephalic and atraumatic.     Mouth/Throat:     Mouth: Oropharynx is  clear and moist and mucous membranes are normal. Mucous membranes are moist.     Pharynx: Oropharynx is clear.  Eyes:     Conjunctiva/sclera: Conjunctivae normal.     Pupils: Pupils are equal, round, and reactive to light.  Neck:     Vascular: No JVD.  Cardiovascular:     Rate and Rhythm: Normal rate and regular rhythm.     Heart sounds: No murmur  heard.   Pulmonary:     Effort: Pulmonary effort is normal. No respiratory distress.     Breath sounds: Normal breath sounds.  Abdominal:     Palpations: Abdomen is soft. There is no hepatomegaly or mass.     Tenderness: There is no abdominal tenderness.  Musculoskeletal:        General: No edema.  Lymphadenopathy:     Cervical: No cervical adenopathy.  Skin:    General: Skin is warm.     Findings: No erythema or rash.  Neurological:     General: No focal deficit present.     Mental Status: She is alert and oriented to person, place, and time.     Cranial Nerves: No cranial nerve deficit.     Deep Tendon Reflexes: Strength normal.     Comments: Mildly unstable gait, not assisted.  Psychiatric:        Mood and Affect: Mood and affect normal.     Comments: Well groomed, good eye contact.   ASSESSMENT AND PLAN:   Ms. HYE TOWNSHEND was seen today for follow-up.  Orders Placed This Encounter  Procedures  . Hepatitis C antibody  . Basic metabolic panel  . Brain Natriuretic Peptide  . Microalbumin / creatinine urine ratio  . Vitamin B12   Lab Results  Component Value Date   [id number] (H) 04/03/2020   Lab Results  Component Value Date   CREATININE 0.99 04/03/2020   BUN 20 04/03/2020   NA 139 04/03/2020   K 3.5 04/03/2020   CL 100 04/03/2020   CO2 30 04/03/2020   Lab Results  Component Value Date   MICROALBUR 1.4 04/03/2020   Type 2 diabetes mellitus with other specified complication ([place]) Problem has been well controlled. Not yet due for HgA1C. Continue non pharmacologic treatment.  HFrEF (heart failure  with reduced ejection fraction) (HCC) Continue Furosemide 20 mg bid and Metoprolol succinate. Further recommendations according to BNP result.  B12 deficiency Continue B12 2000 mcg daily. Further recommendations according to B12 result.  Essential (primary) hypertension BP on lower normal range. Recommend monitoring BP at home. For now continue Metoprolol succinate 50 mg daily. Low salt diet.  Encounter for HCV screening test for low risk patient - Hepatitis C antibody  Lightheadedness Residual after CVA and gradually improving. Fall precautions.  Return in about 5 months (around 09/01/2020) for DM II,HTN,.   Blayde Bacigalupi G. Martinique, MD  [facility]. [place] office.   A few things to remember from today's visit:   Type 2 diabetes mellitus with other specified complication, without long-term current use of insulin ([place]) - Plan: Basic metabolic panel  HFrEF (heart failure with reduced ejection fraction) ([place]) - Plan: Brain Natriuretic Peptide  Essential (primary) hypertension - Plan: Basic metabolic panel  123456 deficiency - Plan: Vitamin B12  Encounter for HCV screening test for low risk patient - Plan: Hepatitis C antibody  Lightheadedness  If you need refills please call your pharmacy. Do not use My Chart to request refills or for acute issues that need immediate attention.   No changes today but we need to check blood pressure at home, mildly low today. Lightheadedness can be due to low blood pressure, dehydration. Fall precautions.  Please be sure medication list is accurate. If a new problem present, please set up appointment sooner than planned today.

## 2020-04-03 NOTE — Addendum Note (Signed)
Addended by: Marrion Coy on: 04/03/2020 10:09 AM   Modules accepted: Orders

## 2020-04-03 NOTE — Patient Instructions (Addendum)
A few things to remember from today's visit:   Type 2 diabetes mellitus with other specified complication, without long-term current use of insulin ([place]) - Plan: Basic metabolic panel  HFrEF (heart failure with reduced ejection fraction) ([place]) - Plan: Brain Natriuretic Peptide  Essential (primary) hypertension - Plan: Basic metabolic panel  S97 deficiency - Plan: Vitamin B12  Encounter for HCV screening test for low risk patient - Plan: Hepatitis C antibody  Lightheadedness  If you need refills please call your pharmacy. Do not use My Chart to request refills or for acute issues that need immediate attention.   No changes today but we need to check blood pressure at home, mildly low today. Lightheadedness can be due to low blood pressure, dehydration. Fall precautions.  Please be sure medication list is accurate. If a new problem present, please set up appointment sooner than planned today.

## 2020-04-03 NOTE — Assessment & Plan Note (Signed)
BP on lower normal range. Recommend monitoring BP at home. For now continue Metoprolol succinate 50 mg daily. Low salt diet.

## 2020-04-04 LAB — MICROALBUMIN / CREATININE URINE RATIO
Creatinine,U: 86.7 mg/dL
Microalb Creat Ratio: 1.6 mg/g (ref 0.0–30.0)
Microalb, Ur: 1.4 mg/dL (ref 0.0–1.9)

## 2020-04-04 LAB — HEPATITIS C ANTIBODY
Hepatitis C Ab: NONREACTIVE
SIGNAL TO CUT-OFF: 0.02 (ref <1.00)

## 2020-04-21 ENCOUNTER — Other Ambulatory Visit: Payer: Self-pay | Admitting: Internal Medicine

## 2020-04-25 ENCOUNTER — Other Ambulatory Visit: Payer: Self-pay | Admitting: Family Medicine

## 2020-04-26 ENCOUNTER — Other Ambulatory Visit: Payer: Self-pay

## 2020-04-26 MED ORDER — SERTRALINE HCL 50 MG PO TABS: 50.0000 mg | ORAL_TABLET | Freq: Every day | ORAL | 2 refills | Status: DC

## 2020-05-02 ENCOUNTER — Telehealth: Payer: Self-pay | Admitting: Family Medicine

## 2020-05-02 NOTE — Telephone Encounter (Signed)
Pt is calling in stating that she would like to have a handicap placard form filled out and mailed to her address on file the address on file has been verified and it is correct.

## 2020-05-02 NOTE — Telephone Encounter (Signed)
Form mailed

## 2020-05-02 NOTE — Telephone Encounter (Signed)
Form filled out & on pcp's desk to be signed.  

## 2020-05-18 ENCOUNTER — Other Ambulatory Visit: Payer: Self-pay | Admitting: *Deleted

## 2020-05-18 NOTE — Patient Outreach (Signed)
[facility]) Care Management  05/18/2020  MONIC ENGELMANN Sep 28, 1945 [id number]   [facility] incoming call from complex care patient Mrs DIANNAH RINDFLEISCH was referred to [facility] on 10/21/19 forRED ON EMMI ALERT Day #1Date:Wednesday 7/28/21Red Alert Reason:Loss of interest, feeling sad? yes The EMMI was resolved on 7/29/21[facility] SW services and she did not prefer [facility] SW referral  Insurance: [employer] Medicareand bankers supplement/[employer] [facility] x1ED visits x in the last 6 months  Last admission 10/06/19 to 10/08/19 for stroke/TIA  Followup Mrs Rettig reports she noted a possible call from [place] and outreached She reviewed her phone during this call to confirm the last outreach from [place] was in January 2022 not on May 17 2020 She reports she is overall doingverygood She has returned to [place] [facility] after visiting her sister in [place]  She denies any medical or social concerns  She and [facility] RN CM reviewed the 06/27/20 upcoming [facility] outreach scheduled  Plan Patient agrees to care plan and follow up [place] CM will follow up with Mrs Warrick within the next 45 business days to assess for any further care coordination or disease management needs and assess for health coach services  Pt encouraged to return a call to [facility] RN CM prn  Matilde Markie L. Lavina Hamman, RN, BSN, [place] Coordinator Office number [phone] [facility] number [phone] Fax number [phone]

## 2020-05-19 ENCOUNTER — Other Ambulatory Visit: Payer: Self-pay

## 2020-05-19 ENCOUNTER — Ambulatory Visit (INDEPENDENT_AMBULATORY_CARE_PROVIDER_SITE_OTHER): Payer: Medicare Other | Admitting: Family Medicine

## 2020-05-19 ENCOUNTER — Encounter: Payer: Self-pay | Admitting: Family Medicine

## 2020-05-19 VITALS — BP 124/70 | HR 91 | Resp 16 | Ht 65.0 in | Wt 177.1 lb

## 2020-05-19 DIAGNOSIS — M7542 Impingement syndrome of left shoulder: Secondary | ICD-10-CM

## 2020-05-19 DIAGNOSIS — R52 Pain, unspecified: Secondary | ICD-10-CM | POA: Diagnosis not present

## 2020-05-19 DIAGNOSIS — M2550 Pain in unspecified joint: Secondary | ICD-10-CM | POA: Diagnosis not present

## 2020-05-19 NOTE — Patient Instructions (Addendum)
A few things to remember from today's visit:  Arthralgia, unspecified joint  Impingement syndrome, shoulder, left - Plan: Ambulatory referral to Physical Therapy  If you need refills please call your pharmacy. Do not use My Chart to request refills or for acute issues that need immediate attention.   Generalized joint pain and stuffiness can be osteoarthritis. Duloxetine can be considered in the future. Continue Tylenol.  Please ask about pacemaker area tenderness when you have it check. Please be sure medication list is accurate. If a new problem present, please set up appointment sooner than planned today.

## 2020-05-19 NOTE — Progress Notes (Addendum)
Chief Complaint  Patient presents with  . Shoulder Pain    Left, x a week   HPI: [name] is a 75 y.o. right handed female with hx of DM II,HFrEF,CAD,CVA,and HLD here today with above complaint. Bilateral shoulder pain for a while. She has been working on her garden for the past couple weeks. It seems to be worse for the past 2 weeks, L>R. Shoulder pain interferes with sleep. L>R, mild limitation of ROM. Achy like,sometimes 10/10. Pain is exacerbated by movement and alleviated by rest.  Sometimes her "whole body hurts", mainly side affected by CVA. + Stiffness, worse in the morning. No injuries. No joint edema or erythema.  Tylenol helps.  No changes in breathing. Negative for fever,changes in appetite,CP,wheezing, cough, palpitations,or diaphoresis.  Tenderness around pacemaker since placement, 2-3 years ago. Pulling like sensation with LUE movement, afraid of "ripping something out." She has not seen cardio in a while. Pacemaker is checked remotely and regularly.  Review of Systems  Constitutional: Positive for fatigue. Negative for activity change.  HENT: Negative for mouth sores, nosebleeds and sore throat.   Gastrointestinal: Negative for abdominal pain, nausea and vomiting.       Negative for changes in bowel habits.  Musculoskeletal: Positive for gait problem.  Skin: Negative for pallor and rash.  Neurological: Negative for syncope, weakness and headaches.  Psychiatric/Behavioral: Positive for sleep disturbance. Negative for confusion.  Rest see pertinent positives and negatives per HPI.  Current Outpatient Medications on File Prior to Visit  Medication Sig Dispense Refill  . acetaminophen (TYLENOL) 650 MG CR tablet Take 650 mg by mouth every 8 (eight) hours as needed for pain.    Marland Kitchen aspirin EC 81 MG tablet Take 81 mg by mouth daily. Swallow whole.    . cholecalciferol (VITAMIN D3) 25 MCG (1000 UT) tablet Take 1,000 Units by mouth daily.    .  clopidogrel (PLAVIX) 75 MG tablet TAKE 1 TABLET BY MOUTH EVERY DAY 90 tablet 3  . Cyanocobalamin (B-12) 2500 MCG TABS Take 1 tablet by mouth daily.    Marland Kitchen ezetimibe (ZETIA) 10 MG tablet TAKE 1 TABLET BY MOUTH EVERY DAY 90 tablet 1  . furosemide (LASIX) 20 MG tablet TAKE 2 TABLETS BY MOUTH DAILY FOR 5 DAYS THEN 1 TABLET DAILY 30 tablet 1  . metoprolol succinate (TOPROL-XL) 50 MG 24 hr tablet TAKE 1 TABLET (50 MG TOTAL) BY MOUTH DAILY. TAKE WITH OR IMMEDIATELY FOLLOWING A MEAL. 90 tablet 2  . omeprazole (PRILOSEC) 20 MG capsule Take 20 mg by mouth as needed (acid reflux).     . rosuvastatin (CRESTOR) 40 MG tablet TAKE 1 TABLET BY MOUTH EVERYDAY AT BEDTIME 90 tablet 3  . sertraline (ZOLOFT) 50 MG tablet Take 1 tablet (50 mg total) by mouth daily. 90 tablet 2   No current facility-administered medications on file prior to visit.   Past Medical History:  Diagnosis Date  . Acute ST segment elevation myocardial infarction ([place])   . Arthritis   . CAD (coronary artery disease) 02/15/2019  . Cancer of left kidney ([place])   . Complete atrioventricular block (HCC)   . DVT (deep venous thrombosis) ([place]) 02/15/2019  . GI bleeding   . 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 HLD (hyperlipidemia) 02/15/2019  . HTN (hypertension) 02/15/2019  . Ischemic cardiomyopathy   . Melena   . Pacemaker   . Pulmonary thromboembolism ([place]) 02/15/2019   Allergies  Allergen Reactions  . Penicillins Rash    Social  History   Socioeconomic History  . Marital status: Widowed    Spouse name: Not on file  . Number of children: 4  . Years of education: Not on file  . Highest education level: Not on file  Occupational History  . Occupation: retired  Tobacco Use  . Smoking status: Former Research scientist (life sciences)  . Smokeless tobacco: Never Used  Vaping Use  . Vaping Use: Never used  Substance and Sexual Activity  . Alcohol use: Never  . Drug use: Never  . Sexual activity: Not on file    Comment: MARRIED  Other Topics  Concern  . Not on file  Social History Narrative  . Not on file   Social Determinants of Health   Financial Resource Strain: Low Risk   . Difficulty of Paying Living Expenses: Not hard at all  Food Insecurity: No Food Insecurity  . Worried About Charity fundraiser in the Last Year: Never true  . Ran Out of Food in the Last Year: Never true  Transportation Needs: No Transportation Needs  . Lack of Transportation (Medical): No  . Lack of Transportation (Non-Medical): No  Physical Activity: Sufficiently Active  . Days of Exercise per Week: 5 days  . Minutes of Exercise per Session: 30 min  Stress: No Stress Concern Present  . Feeling of Stress : Not at all  Social Connections: Moderately Isolated  . Frequency of Communication with Friends and Family: More than three times a week  . Frequency of Social Gatherings with Friends and Family: More than three times a week  . Attends Religious Services: Never  . Active Member of Clubs or Organizations: Yes  . Attends Archivist Meetings: Never  . Marital Status: Widowed   Vitals:   05/19/20 0914  BP: 124/70  Pulse: 91  Resp: 16  SpO2: 96%   Body mass index is 29.48 kg/m.  Physical Exam Vitals and nursing note reviewed.  Constitutional:      General: She is not in acute distress.    Appearance: She is well-developed. She is not ill-appearing.  HENT:     Head: Normocephalic and atraumatic.  Eyes:     Conjunctiva/sclera: Conjunctivae normal.  Cardiovascular:     Rate and Rhythm: Normal rate and regular rhythm.     Heart sounds: No murmur heard.   Pulmonary:     Effort: Pulmonary effort is normal. No respiratory distress.  Chest:    Musculoskeletal:        General: No edema.     Right shoulder: No effusion, tenderness or bony tenderness. Normal range of motion.     Left shoulder: Tenderness present. No deformity or bony tenderness. Decreased range of motion.     Cervical back: No tenderness or bony tenderness.      Thoracic back: No lacerations. Normal range of motion.     Lumbar back: No tenderness or bony tenderness.     Comments: No tender trigger paints. Tenderness upon palpation of left infraclavicular area. Left shoulder: No deformity, edema, or erythema appreciated.No muscle atrophy. Luan Pulling' test pos, drop arm rotator cuff test pos, empty can supraspinatus test pos, lift-Off Subscapularis test could not do it,elicited pain and ROM is limited.  Skin:    General: Skin is warm.     Findings: No erythema or rash.  Neurological:     Mental Status: She is alert and oriented to person, place, and time.     Deep Tendon Reflexes: Strength normal.     Comments: Mildly  unstable gait, not assisted.  Psychiatric:        Mood and Affect: Mood and affect normal. Mood is not depressed.   ASSESSMENT AND PLAN:  Ms.Starletta was seen today for shoulder pain.  Diagnoses and all orders for this visit: Orders Placed This Encounter  Procedures  . Ambulatory referral to Physical Therapy   Impingement syndrome, shoulder, left We discussed Dx,prognosis,and treatment options. PT will be arranged. She prefers to hold on medications like Tramadol for now. Continue Tylenol 500 mg 3-4 times per day. We will hold on imaging for now.  Arthralgia, unspecified joint Worse on joints affected by CVA. OA most likely. Fall precautions. Continue Tylenol. We could consider Duloxetine if problem gets worse.  Pain in pacemaker pocket Chronic. Recommend asking provider during pacemaker check. She will let me know if she needs a referral to cardiologist. Topical icy hot may help.  Return if symptoms worsen or fail to improve, for Keep next appt..   Rudolf Blizard G. [place], MD  [facility]. [place] office.  A few things to remember from today's visit:  Arthralgia, unspecified joint  Impingement syndrome, shoulder, left - Plan: Ambulatory referral to Physical Therapy  If you need refills please call your  pharmacy. Do not use My Chart to request refills or for acute issues that need immediate attention.   Generalized joint pain and stuffiness can be osteoarthritis. Duloxetine can be considered in the future. Continue Tylenol.  Please ask about pacemaker area tenderness when you have it check. Please be sure medication list is accurate. If a new problem present, please set up appointment sooner than planned today.

## 2020-06-06 ENCOUNTER — Ambulatory Visit (INDEPENDENT_AMBULATORY_CARE_PROVIDER_SITE_OTHER): Payer: Medicare Other

## 2020-06-06 DIAGNOSIS — I6302 Cerebral infarction due to thrombosis of basilar artery: Secondary | ICD-10-CM | POA: Diagnosis not present

## 2020-06-06 LAB — CUP PACEART REMOTE DEVICE CHECK
Battery Remaining Longevity: 147 mo
Battery Voltage: 3.02 V
Brady Statistic AP VP Percent: 11.56 %
Brady Statistic AP VS Percent: 11.08 %
Brady Statistic AS VP Percent: 28.7 %
Brady Statistic AS VS Percent: 48.66 %
Brady Statistic RA Percent Paced: 22.65 %
Brady Statistic RV Percent Paced: 40.26 %
Date Time Interrogation Session: 20220315034512
Implantable Lead Implant Date: 20191121
Implantable Lead Implant Date: 20191121
Implantable Lead Location: 753859
Implantable Lead Location: 753860
Implantable Lead Model: 4076
Implantable Lead Model: 4076
Implantable Pulse Generator Implant Date: 20191121
Lead Channel Impedance Value: 342 Ohm
Lead Channel Impedance Value: 418 Ohm
Lead Channel Impedance Value: 475 Ohm
Lead Channel Impedance Value: 570 Ohm
Lead Channel Pacing Threshold Amplitude: 0.625 V
Lead Channel Pacing Threshold Amplitude: 0.875 V
Lead Channel Pacing Threshold Pulse Width: 0.4 ms
Lead Channel Pacing Threshold Pulse Width: 0.4 ms
Lead Channel Sensing Intrinsic Amplitude: 2.625 mV
Lead Channel Sensing Intrinsic Amplitude: 2.625 mV
Lead Channel Sensing Intrinsic Amplitude: 25.5 mV
Lead Channel Sensing Intrinsic Amplitude: 25.5 mV
Lead Channel Setting Pacing Amplitude: 1.75 V
Lead Channel Setting Pacing Amplitude: 2 V
Lead Channel Setting Pacing Pulse Width: 0.4 ms
Lead Channel Setting Sensing Sensitivity: 1.2 mV

## 2020-06-14 NOTE — Progress Notes (Signed)
Remote pacemaker transmission.   

## 2020-06-27 ENCOUNTER — Other Ambulatory Visit: Payer: Self-pay

## 2020-06-27 ENCOUNTER — Other Ambulatory Visit: Payer: Self-pay | Admitting: *Deleted

## 2020-06-27 NOTE — Patient Outreach (Signed)
[facility]) Care Management  06/27/2020  April Lowery May 14, 1945 [id number]   [facility] complex care patient April Lowery was referred to [facility] on 10/21/19 forRED ON EMMI ALERT Day #1Date:Wednesday 7/28/21Red Alert Reason:Loss of interest, feeling sad? yes The EMMI was resolved on 7/29/21[facility] SW services and she did not prefer [facility] SW referral  Insurance: [employer] supplement/[employer] [facility] admissions x1ED visits x in the last 6 months  Last admission 10/06/19 to 10/08/19 for stroke/TIA  Followup Patient is able to verify HIPAA ([place] and [place]) identifiers Reviewed and addressed the purpose of the follow up call with the patient  Consent: [facility] RN CM reviewed [facility] services with patient. Patient gave verbal consent for services.  April Lowery reports she is doing very well  She reports returning to [facility] from [place] about 3 weeks ago Continuing to remain active   Recently she has noted Lower left back denies increase activity or injury She questions if it is sciatica She is not sure of how or when the pain started She guess it  maybe from sitting long periods of time on a plane or a possible twist of her left ankle  Reviewed low back pain and sciatica causes, home treatment, tests, when to outreach to EMS or MD  She voiced understanding and was provided answers to her questions  Gait/Balance still "wacky" but not falls  Hypertension (HTN) BP good below 140/90  Diabetes (DM) type 2  cbg range 80-130 Last HgAlc 6.8 12/2019 pending recheck by pcp   Maintaining weight at 174-177 lbs but would like to get to 145 lb Reviewed nutrition management consult via pcp She wants to continue to watch want she eats    Sent patient EMMI education on acute low back pain (video) sciatica exercises, low back pain in adults, weight loss diet and weight loss tips to her confirmed e mail  address   Discussed [facility] complex case progression. No needs identified for care coordination nor disease management. Pt does not prefer referral to [facility] disease management Agrees to case closure with availability to outreach to [facility] RN CM prn   Plan Goals Addressed              This Visit's Progress     Patient Stated   .  COMPLETED: ([facility]) Monitor and Manage My Blood Sugar (pt-stated)   On track     Case closure 06/27/20   - check blood sugar at prescribed times - check blood sugar if I feel it is too high or too low - take the blood sugar log to all doctor visits - take the blood sugar meter to all doctor visits     Notes: 06/27/20 no new concerns Highest value has been 130 managing well at home 03/29/20 continues to be managed at home well    .  COMPLETED: [facility]) Track and Manage My Blood Pressure (pt-stated)   On track     Case closure 06/27/20   - check blood pressure weekly - choose a place to take my blood pressure (home, clinic or office, retail store) - write blood pressure results in a log or diary    Notes: 06/27/20 denies elevations managed at home 03/29/20 being managed well at  home         [place]. Lavina Hamman, RN, BSN, [place] Coordinator Office number [phone] [facility] number [phone] Fax number [phone]

## 2020-07-05 ENCOUNTER — Encounter: Payer: Self-pay | Admitting: Family Medicine

## 2020-07-11 ENCOUNTER — Ambulatory Visit (INDEPENDENT_AMBULATORY_CARE_PROVIDER_SITE_OTHER): Payer: Medicare Other

## 2020-07-11 ENCOUNTER — Other Ambulatory Visit: Payer: Self-pay

## 2020-07-11 ENCOUNTER — Ambulatory Visit (INDEPENDENT_AMBULATORY_CARE_PROVIDER_SITE_OTHER): Payer: Medicare Other | Admitting: Family Medicine

## 2020-07-11 ENCOUNTER — Encounter: Payer: Self-pay | Admitting: Family Medicine

## 2020-07-11 VITALS — BP 120/72 | HR 67 | Temp 98.6°F | Resp 16 | Ht 65.0 in | Wt 170.0 lb

## 2020-07-11 DIAGNOSIS — I6302 Cerebral infarction due to thrombosis of basilar artery: Secondary | ICD-10-CM

## 2020-07-11 DIAGNOSIS — G8929 Other chronic pain: Secondary | ICD-10-CM

## 2020-07-11 DIAGNOSIS — M25552 Pain in left hip: Secondary | ICD-10-CM

## 2020-07-11 DIAGNOSIS — M545 Low back pain, unspecified: Secondary | ICD-10-CM

## 2020-07-11 IMAGING — DX DG LUMBAR SPINE COMPLETE 4+V
5 series · 5 of 5 positions shown · non-contrast
Comparison: None.

CLINICAL DATA: Worsening left lower back pain

EXAM:
LUMBAR SPINE - COMPLETE 4+ VIEW

[lumbar spine ap]
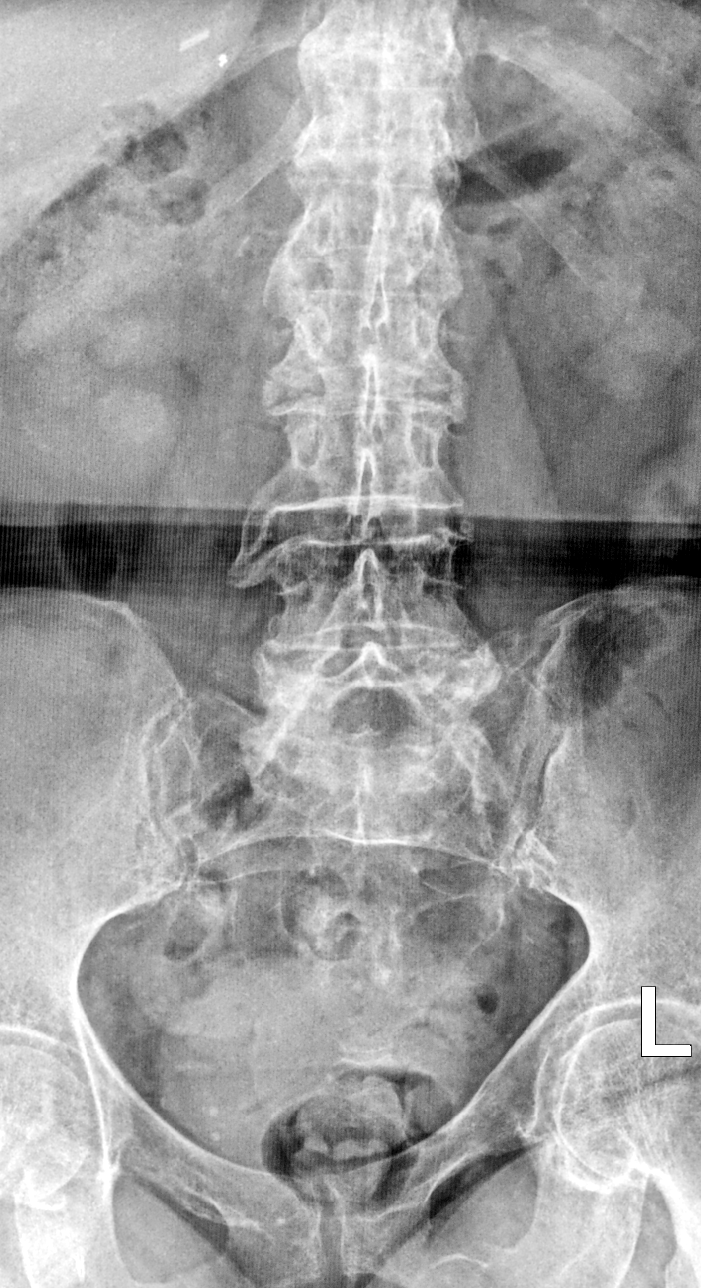

[lumbar spine oblique (1 of 2)]
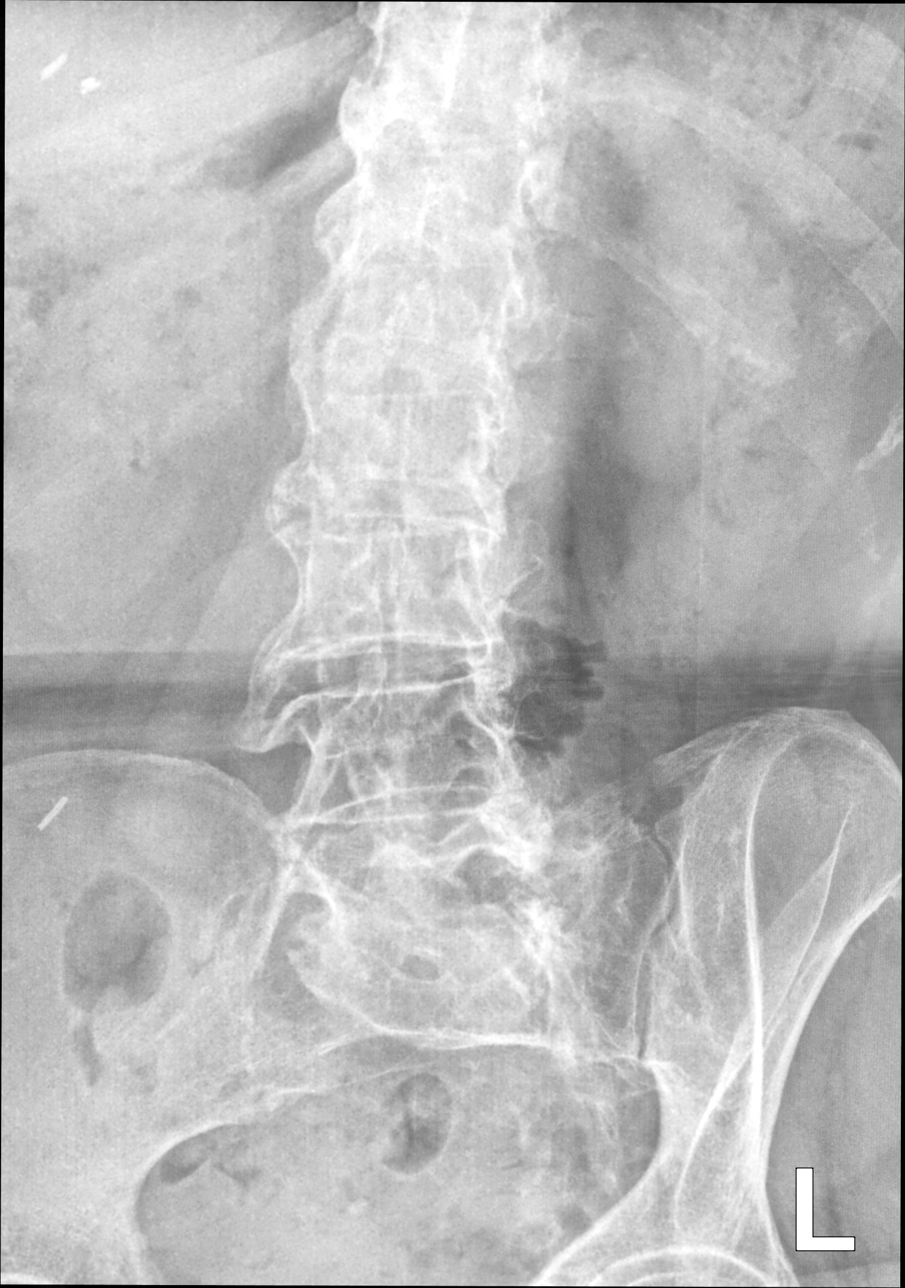

[lumbar spine oblique (2 of 2)]
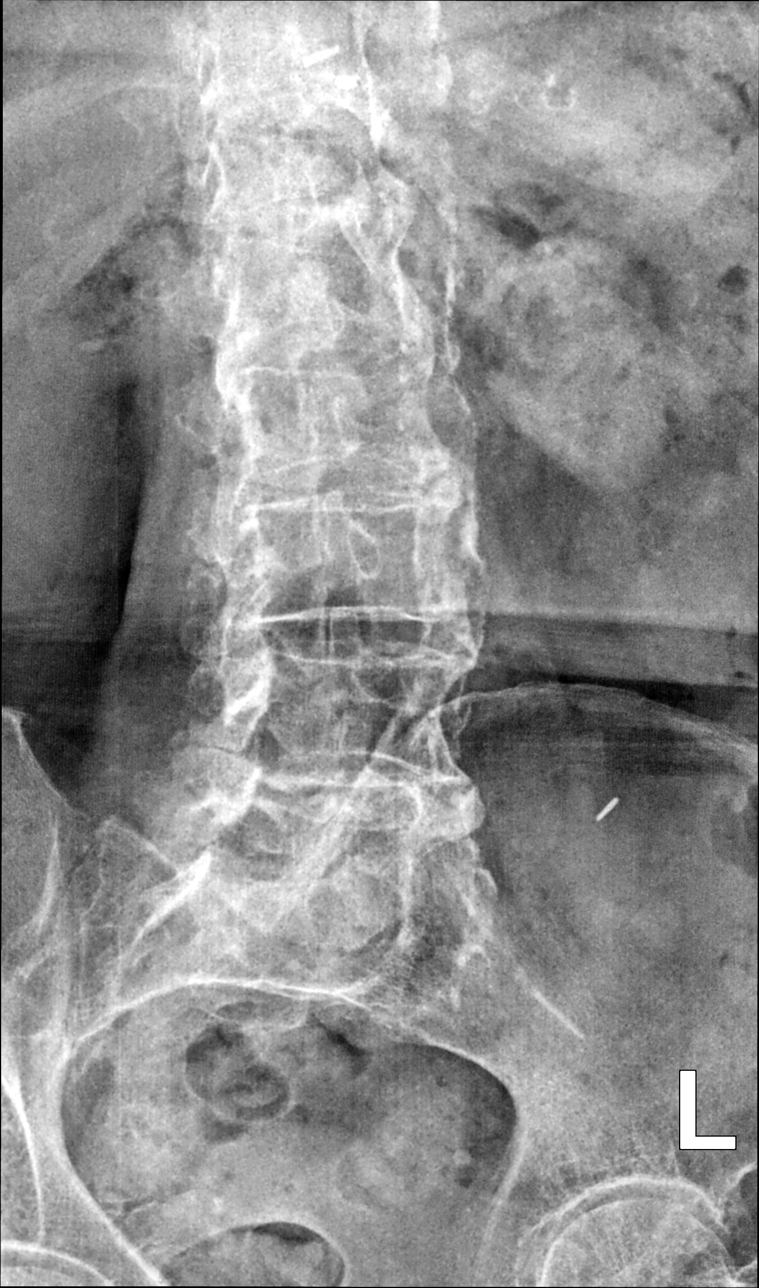

[lumbar spine lat]
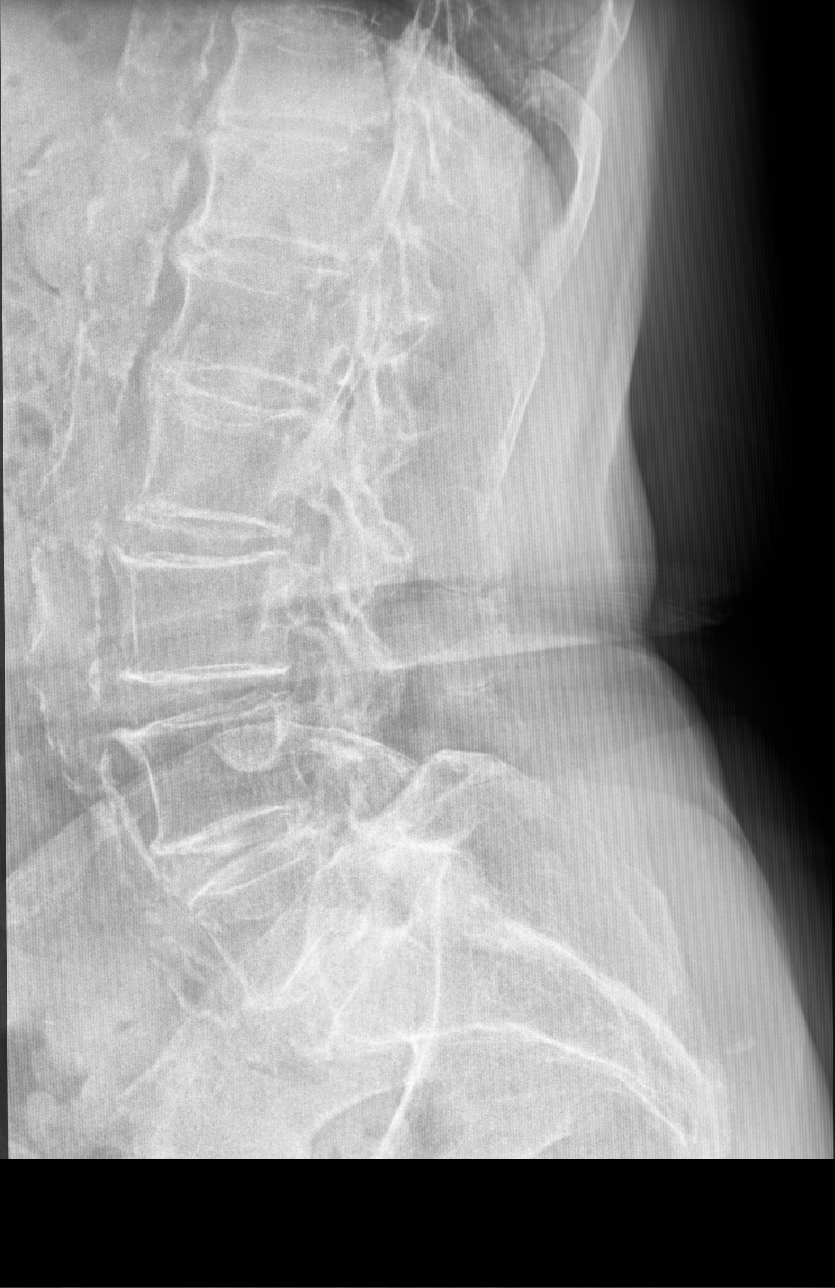

[lumbar spine lat spot]
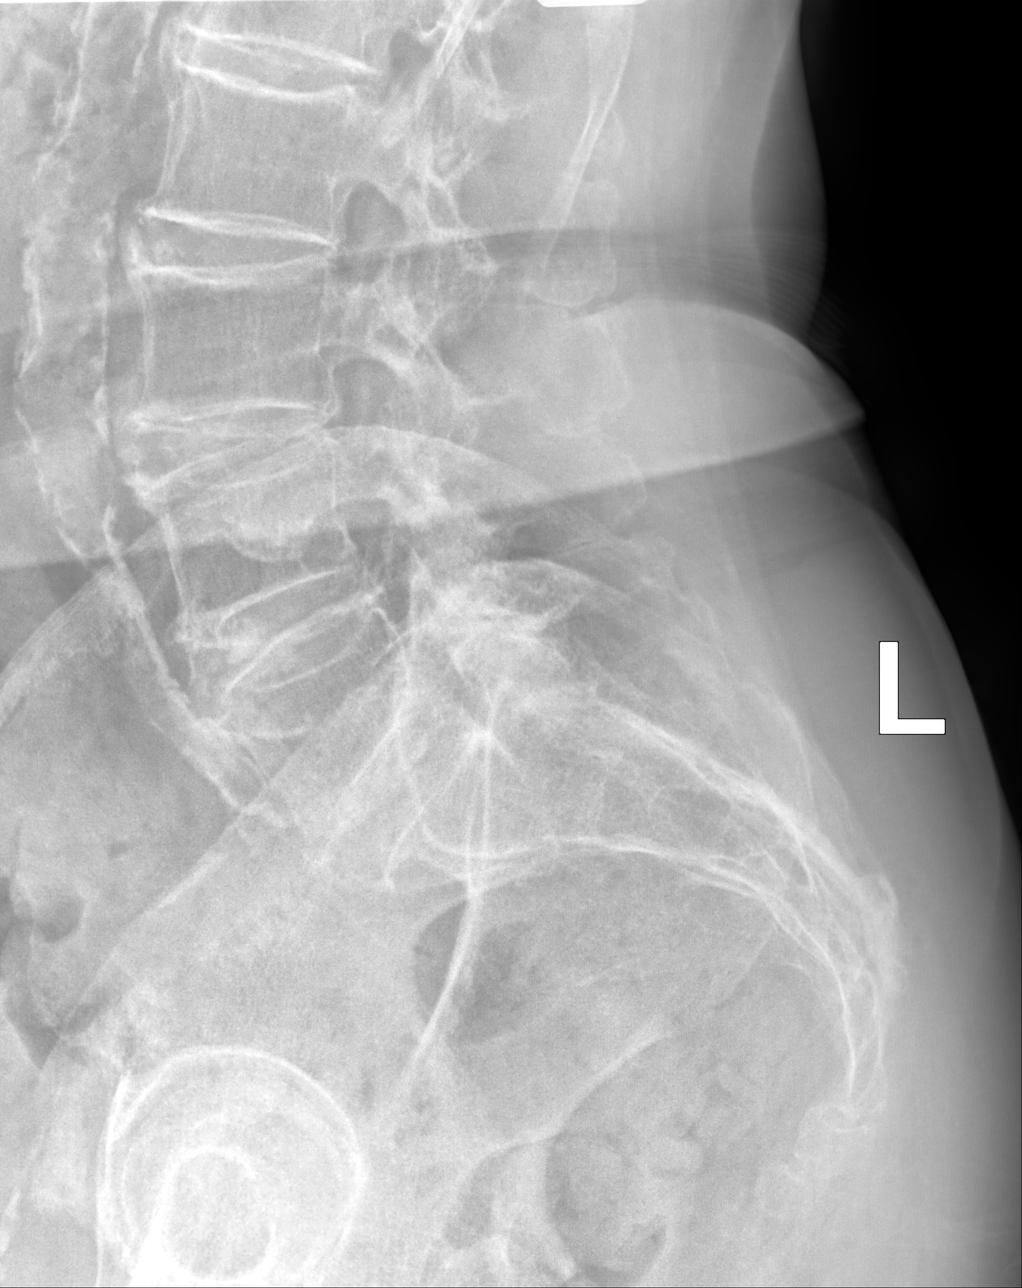

[5 of 5 positions shown; findings below may reference images not displayed]

FINDINGS: There is no evidence of lumbar spine fracture. Alignment is normal.
Multilevel degenerative change of the spine disc space narrowing
facet hypertrophy and bridging anterior osteophytosis. Aortic
atherosclerosis. Cholecystectomy clips.
IMPRESSION: No fracture or dislocation of the lumbar spine visualized.
Multilevel degenerative change of the spine.

## 2020-07-11 IMAGING — DX DG HIP (WITH OR WITHOUT PELVIS) 2-3V*L*
2 series · 2 of 2 positions shown · non-contrast
Comparison: None.

CLINICAL DATA: Left hip pain with decreased range of motion, no
history of trauma

EXAM:
DG HIP (WITH OR WITHOUT PELVIS) 2-3V LEFT

[pelvis ap]
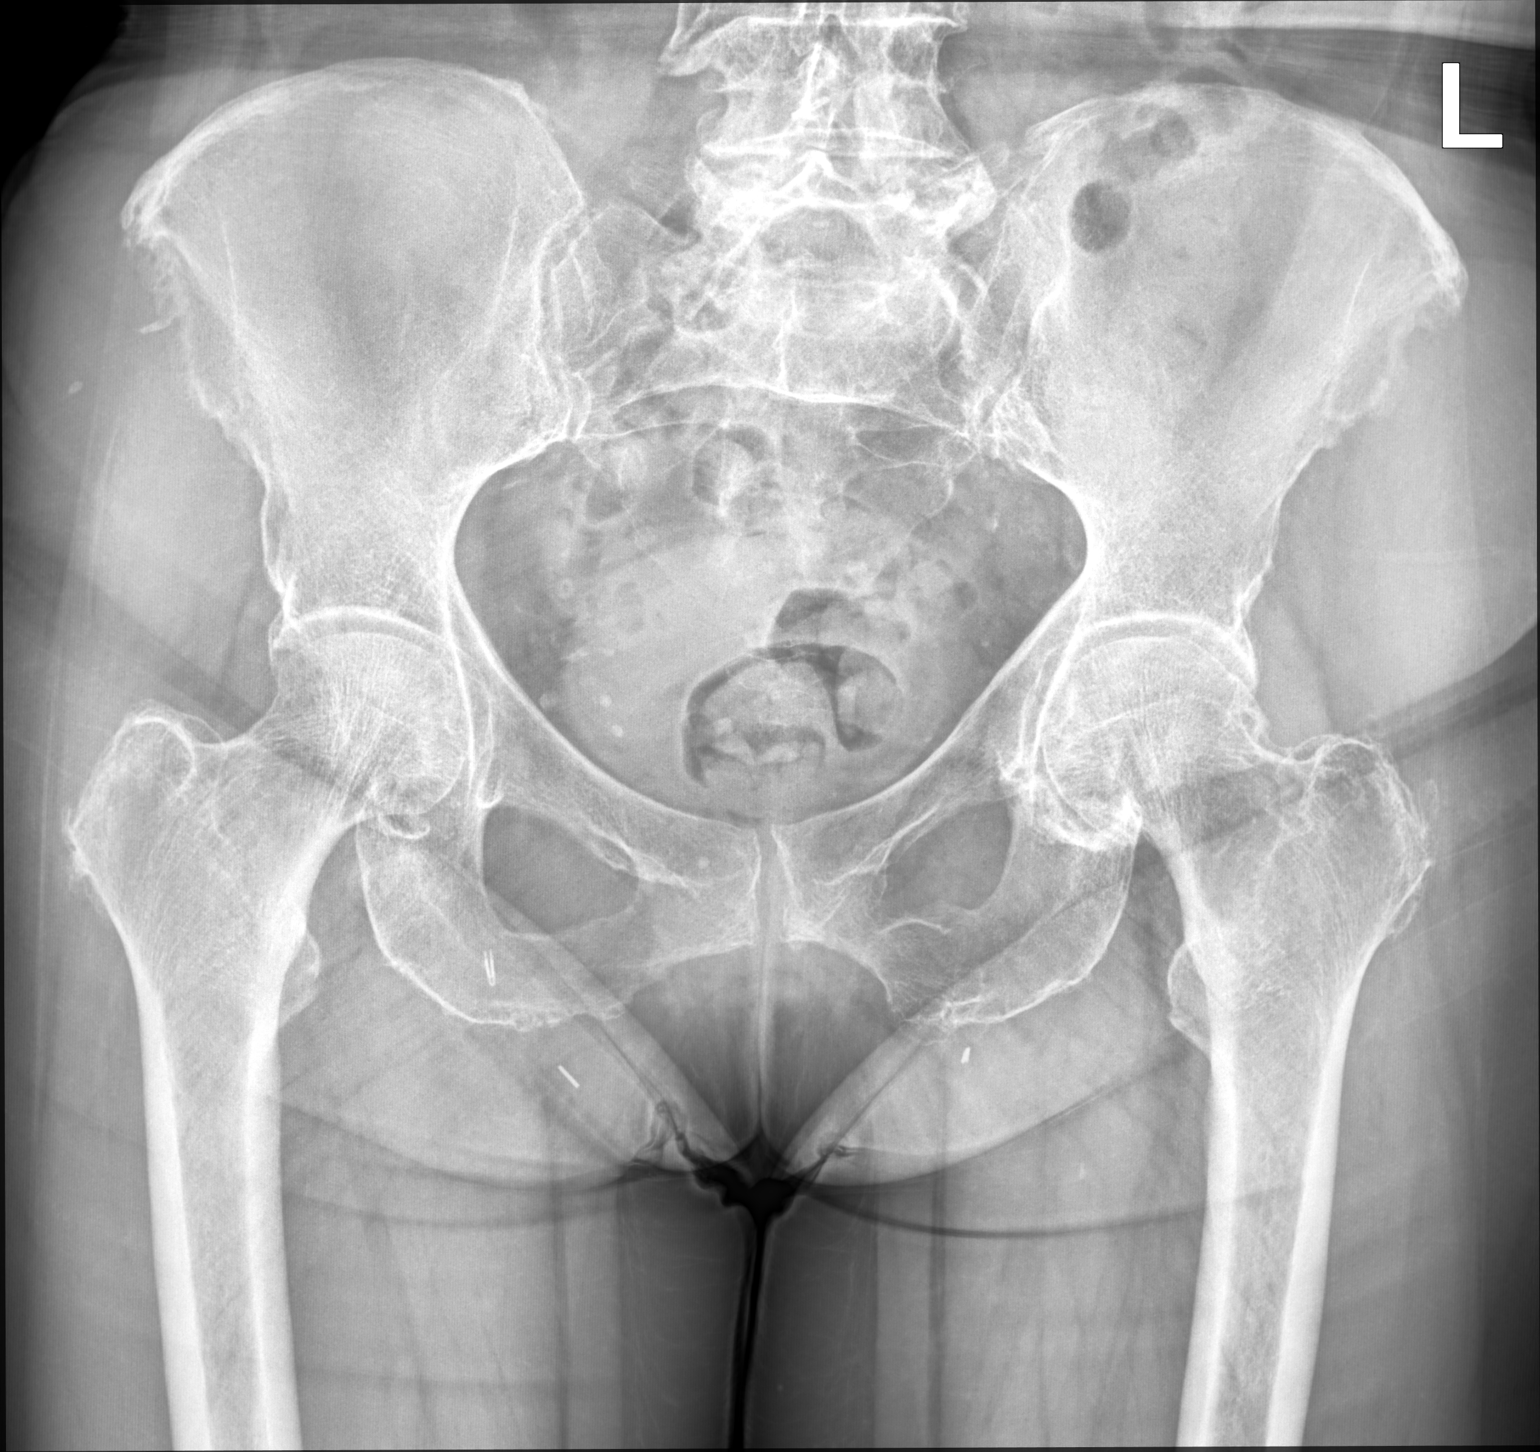

[hip (frog leg) lat]
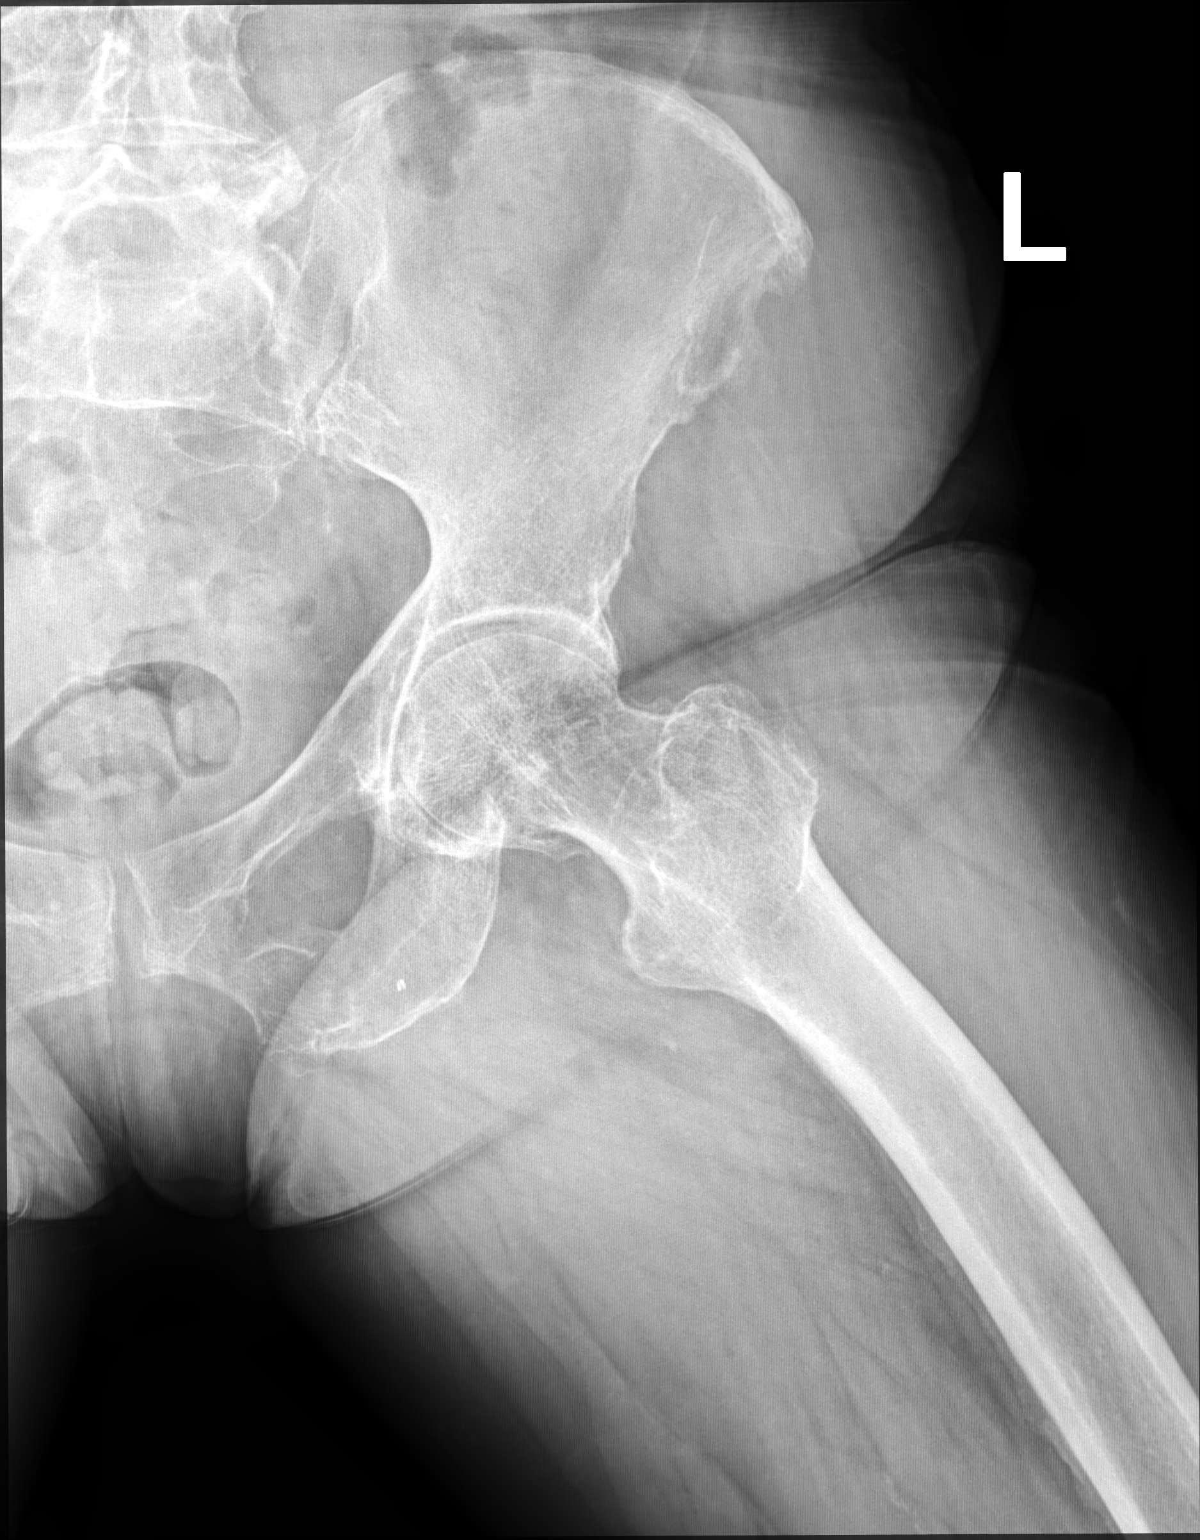

[2 of 2 positions shown; findings below may reference images not displayed]

FINDINGS: There is no evidence of hip fracture or dislocation. Moderate
degenerative change of the left hip. Pelvic phleboliths. Surgical
clips overlie the bilateral inguinal canals. Lumbar spondylosis.
IMPRESSION: Moderate left hip osteoarthritis.

## 2020-07-11 NOTE — Patient Instructions (Addendum)
A few things to remember from today's visit:   Chronic left-sided low back pain, unspecified whether sciatica present - Plan: DG Lumbar Spine Complete, Ambulatory referral to Physical Therapy  Left hip pain - Plan: DG Hip Unilat W OR W/O Pelvis 2-3 Views Left, Ambulatory referral to Physical Therapy  If you need refills please call your pharmacy. Do not use My Chart to request refills or for acute issues that need immediate attention.   Tylenol 500 mg 3-4 times per day. Fall prevention. Icy ho patch may help. PT will be arranged.  Please be sure medication list is accurate. If a new problem present, please set up appointment sooner than planned today.

## 2020-07-11 NOTE — Progress Notes (Signed)
Chief Complaint  Patient presents with  . Back Pain   HPI: April Lowery is a 75 y.o. female with hx of CAD,HTN,CVA,and DM II here today with her son c/o lower back and left hip pain.  She has had problem intermittently for year, getting worse for the past 1-2 month. Left lower back pain. Limitation of ROM.  Negative for recent injury or unusual level of activity.  Pain is not radiated, dull constant and sharp pain intermittent, up to 10/10 in intensity, with no associated urinary incontinence or retention, stool incontinence, or saddle anesthesia. Residual right sided numbness after CVA, stable.  Exacerbated by bending, movement,and lifting. Sometimes pain is so severe that she cannot move. Alleviated by rest. No rash or edema on area, fever, chills, or abnormal wt loss. She has not noted urinary symptoms. Negative for hip edema,erythema,or deformity.   OTC medications: Tylenol.  She was last seen on 07/11/20 because left-sided shoulder pain, she has not startd PT, pain has improved.  Review of Systems  Constitutional: Positive for activity change. Negative for chills.  Respiratory: Negative for cough, shortness of breath and wheezing.   Cardiovascular: Negative for chest pain and leg swelling.  Gastrointestinal: Negative for abdominal pain, nausea and vomiting.       Negative for changes in bowel habits.  Genitourinary: Negative for decreased urine volume, dysuria and hematuria.  Musculoskeletal: Positive for arthralgias and gait problem.  Skin: Negative for color change and pallor.  Neurological: Negative for weakness and numbness.  Rest see pertinent positives and negatives per HPI.  Current Outpatient Medications on File Prior to Visit  Medication Sig Dispense Refill  . acetaminophen (TYLENOL) 650 MG CR tablet Take 650 mg by mouth every 8 (eight) hours as needed for pain.    Marland Kitchen aspirin EC 81 MG tablet Take 81 mg by mouth daily. Swallow whole.    .  cholecalciferol (VITAMIN D3) 25 MCG (1000 UT) tablet Take 1,000 Units by mouth daily.    . clopidogrel (PLAVIX) 75 MG tablet TAKE 1 TABLET BY MOUTH EVERY DAY 90 tablet 3  . Cyanocobalamin (B-12) 2500 MCG TABS Take 1 tablet by mouth daily.    Marland Kitchen ezetimibe (ZETIA) 10 MG tablet TAKE 1 TABLET BY MOUTH EVERY DAY 90 tablet 1  . furosemide (LASIX) 20 MG tablet TAKE 2 TABLETS BY MOUTH DAILY FOR 5 DAYS THEN 1 TABLET DAILY 30 tablet 1  . metoprolol succinate (TOPROL-XL) 50 MG 24 hr tablet TAKE 1 TABLET (50 MG TOTAL) BY MOUTH DAILY. TAKE WITH OR IMMEDIATELY FOLLOWING A MEAL. 90 tablet 2  . omeprazole (PRILOSEC) 20 MG capsule Take 20 mg by mouth as needed (acid reflux).     . rosuvastatin (CRESTOR) 40 MG tablet TAKE 1 TABLET BY MOUTH EVERYDAY AT BEDTIME 90 tablet 3  . sertraline (ZOLOFT) 50 MG tablet Take 1 tablet (50 mg total) by mouth daily. 90 tablet 2   No current facility-administered medications on file prior to visit.   Past Medical History:  Diagnosis Date  . Acute ST segment elevation myocardial infarction ([place])   . Arthritis   . CAD (coronary artery disease) 02/15/2019  . Cancer of left kidney ([place])   . Complete atrioventricular block ([facility])   . DVT (deep venous thrombosis) ([place]) 02/15/2019  . GI bleeding   . 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 HLD (hyperlipidemia) 02/15/2019  . HTN (hypertension) 02/15/2019  . Ischemic cardiomyopathy   . Melena   . Pacemaker   . Pulmonary  thromboembolism ([place]) 02/15/2019   Allergies  Allergen Reactions  . Penicillins Rash    Social History   Socioeconomic History  . Marital status: Widowed    Spouse name: Not on file  . Number of children: 4  . Years of education: Not on file  . Highest education level: Not on file  Occupational History  . Occupation: retired  Tobacco Use  . Smoking status: Former Research scientist (life sciences)  . Smokeless tobacco: Never Used  Vaping Use  . Vaping Use: Never used  Substance and Sexual Activity  . Alcohol use: Never  .  Drug use: Never  . Sexual activity: Not on file    Comment: MARRIED  Other Topics Concern  . Not on file  Social History Narrative  . Not on file   Social Determinants of Health   Financial Resource Strain: Low Risk   . Difficulty of Paying Living Expenses: Not hard at all  Food Insecurity: No Food Insecurity  . Worried About Charity fundraiser in the Last Year: Never true  . Ran Out of Food in the Last Year: Never true  Transportation Needs: No Transportation Needs  . Lack of Transportation (Medical): No  . Lack of Transportation (Non-Medical): No  Physical Activity: Sufficiently Active  . Days of Exercise per Week: 5 days  . Minutes of Exercise per Session: 30 min  Stress: No Stress Concern Present  . Feeling of Stress : Not at all  Social Connections: Moderately Isolated  . Frequency of Communication with Friends and Family: More than three times a week  . Frequency of Social Gatherings with Friends and Family: More than three times a week  . Attends Religious Services: Never  . Active Member of Clubs or Organizations: Yes  . Attends Archivist Meetings: 1 to 4 times per year  . Marital Status: Widowed    Vitals:   07/11/20 1034  BP: 120/72  Pulse: 67  Resp: 16  Temp: 98.6 F (37 C)  SpO2: 95%   Body mass index is 28.29 kg/m.  Physical Exam Vitals and nursing note reviewed.  Constitutional:      General: She is not in acute distress.    Appearance: She is well-developed. She is not ill-appearing.  HENT:     Head: Normocephalic and atraumatic.  Eyes:     Conjunctiva/sclera: Conjunctivae normal.  Pulmonary:     Effort: Pulmonary effort is normal. No respiratory distress.     Breath sounds: Normal breath sounds.  Chest:  Breasts:     Right: No supraclavicular adenopathy.     Left: No supraclavicular adenopathy.    Abdominal:     Palpations: Abdomen is soft. There is no hepatomegaly or mass.     Tenderness: There is no abdominal tenderness.   Musculoskeletal:     Lumbar back: Tenderness present. No bony tenderness.       Back:     Left hip: Tenderness (Elicited with movement.) present. No bony tenderness. Decreased range of motion (Internal and external rotation.).     Comments: Pain elicited with movement on exam table during examination. No local edema or erythema appreciated, no suspicious lesions.    Lymphadenopathy:     Cervical: No cervical adenopathy.     Upper Body:     Right upper body: No supraclavicular adenopathy.     Left upper body: No supraclavicular adenopathy.  Skin:    General: Skin is warm.     Findings: No erythema or rash.  Neurological:  Mental Status: She is alert and oriented to person, place, and time.     Deep Tendon Reflexes:     Reflex Scores:      Patellar reflexes are 2+ on the right side and 2+ on the left side.    Comments: Mildly unstable and antalgic gait, not assisted.  Psychiatric:     Comments: Well groomed, good eye contact.   ASSESSMENT AND PLAN:  April Lowery was seen today for back pain.  Diagnoses and all orders for this visit:  Chronic left-sided low back pain, unspecified whether sciatica present Getting worse. Lumbar imaging ordered. She prefers not to add a muscle relaxant, side effects discussed. Icy hot on affected area may help. Further recommendations according to imaging results.  -     DG Lumbar Spine Complete; Future -     Ambulatory referral to Physical Therapy  Left hip pain Possible etiologies discussed, OA, radicular pain among some. Because hx of CVA and CAD we do not have many options in regard to pain management. She prefers to hold on Tramadol. Tylenol 500 mg 3-4 times per day. Fall precautions.  -     DG Hip Unilat W OR W/O Pelvis 2-3 Views Left; Future -     Ambulatory referral to Physical Therapy   Return if symptoms worsen or fail to improve, for Keep enxt appt.   Jaedan Schuman G. [place], MD  [facility]. [place] office.   A  few things to remember from today's visit:   Chronic left-sided low back pain, unspecified whether sciatica present - Plan: DG Lumbar Spine Complete, Ambulatory referral to Physical Therapy  Left hip pain - Plan: DG Hip Unilat W OR W/O Pelvis 2-3 Views Left, Ambulatory referral to Physical Therapy  If you need refills please call your pharmacy. Do not use My Chart to request refills or for acute issues that need immediate attention.   Tylenol 500 mg 3-4 times per day. Fall prevention. Icy ho patch may help. PT will be arranged.  Please be sure medication list is accurate. If a new problem present, please set up appointment sooner than planned today.

## 2020-07-23 ENCOUNTER — Encounter: Payer: Self-pay | Admitting: Adult Health

## 2020-07-24 NOTE — Telephone Encounter (Signed)
Okay no problem. Thank you for letting me know!

## 2020-07-24 NOTE — Telephone Encounter (Signed)
Pt request date and time change due to going OOT.  Made change to 07-25-20 at [facility]

## 2020-07-25 ENCOUNTER — Encounter: Payer: Self-pay | Admitting: Adult Health

## 2020-07-25 ENCOUNTER — Ambulatory Visit (INDEPENDENT_AMBULATORY_CARE_PROVIDER_SITE_OTHER): Payer: Medicare Other | Admitting: Adult Health

## 2020-07-25 VITALS — BP 138/72 | HR 61 | Ht 65.0 in | Wt 179.0 lb

## 2020-07-25 DIAGNOSIS — R202 Paresthesia of skin: Secondary | ICD-10-CM | POA: Diagnosis not present

## 2020-07-25 DIAGNOSIS — I639 Cerebral infarction, unspecified: Secondary | ICD-10-CM

## 2020-07-25 DIAGNOSIS — E119 Type 2 diabetes mellitus without complications: Secondary | ICD-10-CM

## 2020-07-25 DIAGNOSIS — E785 Hyperlipidemia, unspecified: Secondary | ICD-10-CM

## 2020-07-25 DIAGNOSIS — I10 Essential (primary) hypertension: Secondary | ICD-10-CM

## 2020-07-25 DIAGNOSIS — I6523 Occlusion and stenosis of bilateral carotid arteries: Secondary | ICD-10-CM | POA: Diagnosis not present

## 2020-07-25 MED ORDER — PANTOPRAZOLE SODIUM 40 MG PO TBEC: 40.0000 mg | DELAYED_RELEASE_TABLET | Freq: Every day | ORAL | 3 refills | Status: DC

## 2020-07-25 NOTE — Progress Notes (Signed)
[facility] [address] [place]. [place] [address] [phone]       STROKE FOLLOW UP NOTE  [name] Date of Birth:  17-Jan-1946 Medical Record Number:  [id number]   Reason for Referral: stroke follow up    SUBJECTIVE:   CHIEF COMPLAINT:  Chief Complaint  Patient presents with  . Follow-up    RM 14 alone Pt is well and stable, still some balance disturbance and R side numbness.     HPI:   Today, 07/25/2020, April Lowery returns for stroke follow-up after prior visit approximately 5 months ago.  Doing well from stroke standpoint without new stroke/TIA symptoms and reports residual right sided paresthesias and gait impairment with imbalance. No recent falls and does not use any assistive device. Right-sided paresthesias "good days bad days". On bad days, can be sensitive to light touch or cold temperature - describes as burring type sensation - will take Tylenol with benefit  Compliant on aspirin and Plavix as well as Crestor and Zetia without associated side effects Blood pressure today 138/72 - monitors at home and typically stable around 120s/70s Remains on nonpharmacological management for DM -last A1c 6.8 12/2019 She has not had any recent lab work but plans on obtaining at next follow-up visit with PCP Dr. Martinique  Recent follow-up with PCP for lower left-sided back pain with x-ray showing multilevel degenerative changes of spine but otherwise unremarkable.  Referred for PT but she plans on holding off as she is traveling to [place] tomorrow to help her sister as her sister's husband is not doing well.   No further concerns at this time   History provided for reference purposes only Update 02/24/2020 JM: April Lowery returns for 50-month stroke follow-up unaccompanied.  Residual deficits of right arm and leg numbness with leg paresthesias, and imbalance progressively improving. Leg pain worse the following day after prolonged activity. Topical cream  with temporary benefit.  Denies new stroke/TIA symptoms.  Remains on aspirin, Plavix, Crestor and Zetia for secondary stroke prevention and history of CAD without side effects.  Blood pressure today 138/67. Stable at home.  Recent A1c 6.8 (down from 7.0) continuing nonpharmacological treatment per PCP. SOB c/o with improvement of lasix.  No concerns at this time.  Initial visit 11/10/2019: April Lowery is being seen for hospital follow-up accompanied by her sister Residual deficits right-sided numbness with sensory impairment, gait instability and occasional vertigo She does report great improvement since discharge.  Recently started working with PT in [place].  Ambulating without assistive device for short distance but use of rolling walker for long distance She questions return to driving as well as going on planned trip to [place] in October Continues on aspirin 325 mg daily and Plavix 75 mg daily without bleeding or bruising Continues on Crestor 40 mg daily and Zetia without side effects Blood pressure today satisfactory at 133/64 Continues to follow with PCP for HTN, HLD and DM management No further concerns at this time  Stroke admission 10/06/2019 April Lowery is a 75 y.o. female w/pmh of PE, HTN, HLD, DVT, CAD who presented on 10/06/2019 with gait instability and decreased sensation to the right leg and right arm.  Presented 2 days prior with dizziness with MRI negative and discharged home.  Stroke work-up revealed left superior medulla and cerebellar stroke likely secondary to large vessel disease in setting of left VA occlusion.  CTA head/neck also showed right VA mild to moderate stenosis, left ICA 50% stenosis and  bilateral siphon moderate arthrosclerosis.  2D echo EF 40 to 45%.  PTA aspirin 81 mg daily and Plavix for history of CAD and recommended increasing aspirin to 325 mg daily in addition to Plavix for 3 months then PTA dosing.  History of HTN stable.  History of HLD with LDL 71  recommend continuation of Crestor 20 mg daily.  No prior history of DM with new diagnosis of DM during admission with A1c 7.0. 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  Evaluated by therapy and recommended PT and discharged home in stable condition.  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 Acute ST segment elevation myocardial infarction ([place])   . Arthritis   . CAD (coronary artery disease) 02/15/2019  . Cancer of left kidney ([place])   . Complete atrioventricular block ([facility])   . DVT (deep venous thrombosis) ([place]) 02/15/2019  . GI bleeding   . 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 HLD (hyperlipidemia) 02/15/2019  . HTN (hypertension) 02/15/2019  . Ischemic cardiomyopathy   . Melena   . Pacemaker   . Pulmonary thromboembolism ([place]) 02/15/2019    PSH:  Past Surgical History:  Procedure Laterality Date  . CHOLECYSTECTOMY    . CORONARY ARTERY BYPASS GRAFT     STENTS  . PACEMAKER INSERTION    . TONSILLECTOMY AND ADENOIDECTOMY  2017    Social History:  Social History   Socioeconomic History  . Marital status: Widowed    Spouse name: Not on file  . Number of children: 4  . Years of education: Not  on file  . Highest education level: Not on file  Occupational History  . Occupation: retired  Tobacco Use  . Smoking status: Former Research scientist (life sciences)  . Smokeless tobacco: Never Used  Vaping Use  . Vaping Use: Never used  Substance and Sexual Activity  . Alcohol use: Never  . Drug use: Never  . Sexual activity: Not on file    Comment: MARRIED  Other Topics Concern  . Not on file  Social History Narrative  . Not on file   Social Determinants of Health   Financial Resource Strain: Low Risk   . Difficulty of Paying Living Expenses: Not hard at all  Food Insecurity: No Food Insecurity  . Worried About Charity fundraiser in the Last Year: Never true  . Ran Out of Food in the Last Year: Never true  Transportation Needs: No Transportation Needs  . Lack of Transportation (Medical): No  . Lack of Transportation (Non-Medical): No  Physical Activity: Sufficiently Active  . Days of Exercise per Week: 5 days  . Minutes of Exercise per Session: 30 min  Stress: No Stress Concern Present  . Feeling of Stress : Not at all  Social Connections: Moderately Isolated  .  Frequency of Communication with Friends and Family: More than three times a week  . Frequency of Social Gatherings with Friends and Family: More than three times a week  . Attends Religious Services: Never  . Active Member of Clubs or Organizations: Yes  . Attends Archivist Meetings: 1 to 4 times per year  . Marital Status: Widowed  Intimate Partner Violence: Not At Risk  . Fear of Current or Ex-Partner: No  . Emotionally Abused: No  . Physically Abused: No  . Sexually Abused: No    Family History:  Family History  Problem Relation Age of Onset  . Heart disease Mother   . Heart attack Mother   . Heart disease Father   . Heart attack Father   . Heart disease Brother   . Heart attack Brother   . Heart attack Maternal Grandmother   . Heart attack Maternal Grandfather   . Heart attack Paternal Grandmother   . Heart  attack Paternal Grandfather     Medications:   Current Outpatient Medications on File Prior to Visit  Medication Sig Dispense Refill  . acetaminophen (TYLENOL) 650 MG CR tablet Take 650 mg by mouth every 8 (eight) hours as needed for pain.    Marland Kitchen aspirin EC 81 MG tablet Take 81 mg by mouth daily. Swallow whole.    . cholecalciferol (VITAMIN D3) 25 MCG (1000 UT) tablet Take 1,000 Units by mouth daily.    . clopidogrel (PLAVIX) 75 MG tablet TAKE 1 TABLET BY MOUTH EVERY DAY 90 tablet 3  . Cyanocobalamin (B-12) 2500 MCG TABS Take 1 tablet by mouth daily.    Marland Kitchen ezetimibe (ZETIA) 10 MG tablet TAKE 1 TABLET BY MOUTH EVERY DAY 90 tablet 1  . furosemide (LASIX) 20 MG tablet TAKE 2 TABLETS BY MOUTH DAILY FOR 5 DAYS THEN 1 TABLET DAILY 30 tablet 1  . metoprolol succinate (TOPROL-XL) 50 MG 24 hr tablet TAKE 1 TABLET (50 MG TOTAL) BY MOUTH DAILY. TAKE WITH OR IMMEDIATELY FOLLOWING A MEAL. 90 tablet 2  . rosuvastatin (CRESTOR) 40 MG tablet TAKE 1 TABLET BY MOUTH EVERYDAY AT BEDTIME 90 tablet 3  . sertraline (ZOLOFT) 50 MG tablet Take 1 tablet (50 mg total) by mouth daily. 90 tablet 2   No current facility-administered medications on file prior to visit.    Allergies:   Allergies  Allergen Reactions  . Penicillins Rash      OBJECTIVE:  Physical Exam  Vitals:   07/25/20 0732  BP: 138/72  Pulse: 61  Weight: 179 lb (81.2 kg)  Height: 5\' 5"  (1.651 m)   Body mass index is 29.79 kg/m. No exam data present  General: well developed, well nourished, very pleasant elderly Caucasian female, seated, in no evident distress Head: head normocephalic and atraumatic.   Neck: supple with no carotid or supraclavicular bruits Cardiovascular: regular rate and rhythm, no murmurs Musculoskeletal: no deformity Skin:  no rash/petichiae Vascular:  Normal pulses all extremities   Neurologic Exam Mental Status: Awake and fully alert.  Fluent speech and language. Oriented to place and time. Recent and remote  memory intact. Attention span, concentration and fund of knowledge appropriate. Mood and affect appropriate.  Cranial Nerves: Pupils equal, briskly reactive to light. Extraocular movements full without nystagmus. Visual fields full to confrontation. Hearing intact. Facial sensation intact. Face, tongue, palate moves normally and symmetrically.  Motor: Normal bulk and tone. Normal strength in all tested extremity muscles. Sensory.:  Intact to light touch, vibratory and pinprick sensation Coordination:  Rapid alternating movements normal in all extremities. Finger-to-nose and heel-to-shin mild right-sided ataxia. Gait and Station: Arises from chair without difficulty. Stance is normal. Gait demonstrates normal stride length and balance without use of assistive device.  Mild difficulty with tandem walking total. Reflexes: 1+ and symmetric. Toes downgoing.          ASSESSMENT: April Lowery is a 75 y.o. year old female presented with dizziness, blurred vision and right-sided weakness/numbness on 10/04/2019 with initial MRI negative but then returned with reonset of right-sided numbness on 10/06/2019 with repeat MRI left superior medulla and cerebellar stroke likely secondary to large vessel disease with CTA showing left VA occlusion and mild to moderate right VA stenosis. Vascular risk factors include large vessel stenosis, chronic left cerebellar infarct on imaging, HTN, HLD, new onset DM, history of PE/DVT, CAD s/p stent and CHF s/p pacemaker.      PLAN:  1. Left medulla and cerebellar stroke:  a. Residual deficit: Right-sided paresthesias and imbalance.  Declines interest in medication management and prefers to continue Tylenol as needed.  She was advised to call office if paresthesias start to interfere with daily activity or sleep.   b. Continue aspirin 81 mg daily and clopidogrel 75 mg daily (hx of CAD) and Crestor 40 mg daily and Zetia for secondary stroke prevention. c. Discussed secondary  stroke prevention measures and importance of close PCP follow up for aggressive stroke risk factor management - plans to repeat lab work at next follow-up visit with PCP (declined lab work today) 2. B/l carotid stenosis:  a. CTA R ICA 25% stenosis and L ICA 50% stenosis b. Remains asymptomatic continuing on DAPT and statin.   c. Obtain carotid ultrasound for surveillance monitoring 3. HTN:  a. BP goal <130/90.  Stable on metoprolol per PCP 4. HLD:  a. LDL goal <70.  Prior LDL 71 (09/2019).  On Crestor and Zetia per PCP.   5. New dx DMII:  a. A1c goal<7.0.  Prior A1c 6.8 (12/2019).  Currently diet controlled per PCP.  Needs repeat A1c 6. GERD:  a. Currently on omeprazole PRN - as she is on plavix, recommend changing to pantoprazole - rx will be sent to pharmacy but request continue prescribing per PCP     Follow up in 6 months or call earlier if needed   CC:  [place] provider: Dr. Sethi Martinique, Malka So, MD     Frann Rider, [facility]  [facility] [address] [place], [place] [address]  Phone [phone] Fax [phone] Note: This document was prepared with digital dictation and possible smart phrase technology. Any transcriptional errors that result from this process are unintentional.

## 2020-07-25 NOTE — Patient Instructions (Signed)
Continue aspirin 81 mg daily and clopidogrel 75 mg daily  and Crestor and Zetia for secondary stroke prevention  Repeat carotid duplex -you will be called to schedule appointment  Start omeprazole and switch to pantoprazole as omeprazole can interfere with Plavix  Continue to follow up with PCP regarding cholesterol, blood pressure and diabetes management  Maintain strict control of hypertension with blood pressure goal below 130/90, diabetes with hemoglobin A1c goal below 7% and cholesterol with LDL cholesterol (bad cholesterol) goal below 70 mg/dL.       Followup in the future with me in 6 months or call earlier if needed    [place]!!    Thank you for coming to see [place] at [facility]. I hope we have been able to provide you high quality care today.  You may receive a patient satisfaction survey over the next few weeks. We would appreciate your feedback and comments so that we may continue to improve ourselves and the health of our patients.

## 2020-07-25 NOTE — Progress Notes (Signed)
I agree with the above plan 

## 2020-07-28 ENCOUNTER — Telehealth: Payer: Self-pay | Admitting: Adult Health

## 2020-07-28 NOTE — Telephone Encounter (Signed)
Medicare/[employer] no auth require patient is ready to be scheduled. Sent a message to Butch Penny to reach out to the patient to schedule.

## 2020-07-31 NOTE — Telephone Encounter (Signed)
Patient is scheduled at [facility] for 09/18/20.

## 2020-08-10 ENCOUNTER — Ambulatory Visit (HOSPITAL_COMMUNITY): Payer: Medicare Other

## 2020-08-24 ENCOUNTER — Ambulatory Visit: Payer: Medicare Other | Admitting: Adult Health

## 2020-08-28 ENCOUNTER — Encounter: Payer: Self-pay | Admitting: Family Medicine

## 2020-08-28 ENCOUNTER — Other Ambulatory Visit: Payer: Self-pay

## 2020-08-28 MED ORDER — EZETIMIBE 10 MG PO TABS: 10.0000 mg | ORAL_TABLET | Freq: Every day | ORAL | 0 refills | Status: DC

## 2020-08-28 MED ORDER — METOPROLOL SUCCINATE ER 50 MG PO TB24: 50.0000 mg | ORAL_TABLET | Freq: Every day | ORAL | 0 refills | Status: DC

## 2020-08-29 ENCOUNTER — Other Ambulatory Visit: Payer: Self-pay | Admitting: Family Medicine

## 2020-09-04 ENCOUNTER — Encounter: Payer: Self-pay | Admitting: Family Medicine

## 2020-09-04 ENCOUNTER — Other Ambulatory Visit: Payer: Self-pay | Admitting: Internal Medicine

## 2020-09-05 ENCOUNTER — Ambulatory Visit (INDEPENDENT_AMBULATORY_CARE_PROVIDER_SITE_OTHER): Payer: Medicare Other

## 2020-09-05 ENCOUNTER — Other Ambulatory Visit: Payer: Self-pay | Admitting: Family Medicine

## 2020-09-05 DIAGNOSIS — I6302 Cerebral infarction due to thrombosis of basilar artery: Secondary | ICD-10-CM | POA: Diagnosis not present

## 2020-09-05 LAB — CUP PACEART REMOTE DEVICE CHECK
Battery Remaining Longevity: 144 mo
Battery Voltage: 3.03 V
Brady Statistic AP VP Percent: 6.75 %
Brady Statistic AP VS Percent: 21.51 %
Brady Statistic AS VP Percent: 5.73 %
Brady Statistic AS VS Percent: 66.01 %
Brady Statistic RA Percent Paced: 28.32 %
Brady Statistic RV Percent Paced: 12.48 %
Date Time Interrogation Session: 20220613195110
Implantable Lead Implant Date: 20191121
Implantable Lead Implant Date: 20191121
Implantable Lead Location: 753859
Implantable Lead Location: 753860
Implantable Lead Model: 4076
Implantable Lead Model: 4076
Implantable Pulse Generator Implant Date: 20191121
Lead Channel Impedance Value: 304 Ohm
Lead Channel Impedance Value: 399 Ohm
Lead Channel Impedance Value: 456 Ohm
Lead Channel Impedance Value: 494 Ohm
Lead Channel Pacing Threshold Amplitude: 0.75 V
Lead Channel Pacing Threshold Amplitude: 0.875 V
Lead Channel Pacing Threshold Pulse Width: 0.4 ms
Lead Channel Pacing Threshold Pulse Width: 0.4 ms
Lead Channel Sensing Intrinsic Amplitude: 2.375 mV
Lead Channel Sensing Intrinsic Amplitude: 2.375 mV
Lead Channel Sensing Intrinsic Amplitude: 26.125 mV
Lead Channel Sensing Intrinsic Amplitude: 26.125 mV
Lead Channel Setting Pacing Amplitude: 1.75 V
Lead Channel Setting Pacing Amplitude: 2 V
Lead Channel Setting Pacing Pulse Width: 0.4 ms
Lead Channel Setting Sensing Sensitivity: 1.2 mV

## 2020-09-11 ENCOUNTER — Other Ambulatory Visit: Payer: Self-pay | Admitting: Internal Medicine

## 2020-09-12 ENCOUNTER — Ambulatory Visit (INDEPENDENT_AMBULATORY_CARE_PROVIDER_SITE_OTHER): Payer: Medicare Other | Admitting: Family Medicine

## 2020-09-12 ENCOUNTER — Encounter: Payer: Self-pay | Admitting: Family Medicine

## 2020-09-12 ENCOUNTER — Other Ambulatory Visit: Payer: Self-pay

## 2020-09-12 VITALS — BP 130/76 | HR 78 | Temp 97.9°F | Resp 16 | Ht 65.0 in | Wt 171.2 lb

## 2020-09-12 DIAGNOSIS — R059 Cough, unspecified: Secondary | ICD-10-CM

## 2020-09-12 DIAGNOSIS — R1310 Dysphagia, unspecified: Secondary | ICD-10-CM

## 2020-09-12 DIAGNOSIS — I6523 Occlusion and stenosis of bilateral carotid arteries: Secondary | ICD-10-CM | POA: Diagnosis not present

## 2020-09-12 NOTE — Progress Notes (Signed)
Chief Complaint  Patient presents with   head cold    Coughing up green mucus, has been on tessalon pearls and taking zyrtec daily. Going on x 2 weeks.   HPI: [name] is a 75 y.o. female with hx of DM II,CAD,HTN,and CVA here today complaining of cough as described above. COVID 19 test negative. Negative for fever,chills,anosmia,ageusia,SOB,wheezing,or CP. Post nasal drainage ,nasal congestion,and rhinorrhea. Symptoms started while she was visiting her sister in [place]. No sick contact.  Evaluated on 08/30/20 in the urgent care. She is on Benzonatate. She is also taking Zyrtec 10 mg daily.  Problem is gradually improving. She is not longer having headache and sinus pressure. It is now more like chest congestion. Cough is not affecting her sleep. Former smoker. Sputum is not longer green, clear, worse in the morning.  DM II: BS's "good."  Having some difficulty swallowing fluids and solid since respiratory symptoms started. She has not noted heartburn,nausea,vomiting,or abdominal pain.  Review of Systems  Constitutional:  Positive for fatigue. Negative for activity change and appetite change.  HENT:  Negative for mouth sores, nosebleeds and sore throat.   Eyes:  Negative for discharge, redness and itching.  Respiratory:  Negative for chest tightness and stridor.   Gastrointestinal:        Negative for changes in bowel habits.  Genitourinary:  Negative for decreased urine volume, dysuria and hematuria.  Neurological:  Negative for syncope and facial asymmetry.  Psychiatric/Behavioral:  Negative for confusion and sleep disturbance. The patient is not nervous/anxious.   Rest see pertinent positives and negatives per HPI.  Current Outpatient Medications on File Prior to Visit  Medication Sig Dispense Refill   acetaminophen (TYLENOL) 650 MG CR tablet Take 650 mg by mouth every 8 (eight) hours as needed for pain.     aspirin EC 81 MG tablet Take 81 mg by mouth daily.  Swallow whole.     cholecalciferol (VITAMIN D3) 25 MCG (1000 UT) tablet Take 1,000 Units by mouth daily.     clopidogrel (PLAVIX) 75 MG tablet TAKE 1 TABLET BY MOUTH EVERY DAY 30 tablet 0   Cyanocobalamin (B-12) 2500 MCG TABS Take 1 tablet by mouth daily.     ezetimibe (ZETIA) 10 MG tablet Take 1 tablet (10 mg total) by mouth daily. Please make yearly appt with April Lowery for July 2022 before anymore refills. Thank you 1st attempt 90 tablet 0   furosemide (LASIX) 20 MG tablet TAKE 2 TABLETS BY MOUTH DAILY FOR 5 DAYS THEN 1 TABLET DAILY 30 tablet 1   metoprolol succinate (TOPROL-XL) 50 MG 24 hr tablet TAKE 1 TABLET (50 MG TOTAL) BY MOUTH DAILY. TAKE WITH OR IMMEDIATELY FOLLOWING A MEAL. 30 tablet 0   pantoprazole (PROTONIX) 40 MG tablet Take 1 tablet (40 mg total) by mouth daily. 90 tablet 3   rosuvastatin (CRESTOR) 40 MG tablet TAKE 1 TABLET BY MOUTH EVERYDAY AT BEDTIME 90 tablet 3   sertraline (ZOLOFT) 50 MG tablet Take 1 tablet (50 mg total) by mouth daily. 90 tablet 2   No current facility-administered medications on file prior to visit.   Past Medical History:  Diagnosis Date   Acute ST segment elevation myocardial infarction [facility])    Arthritis    CAD (coronary artery disease) 02/15/2019   Cancer of left kidney ([facility]CC)    Complete atrioventricular block (HCC)    DVT (deep venous thrombosis) ([place]) 02/15/2019   GI bleeding    History of cerebellar stroke 02/15/2019  HLD (hyperlipidemia) 02/15/2019   HTN (hypertension) 02/15/2019   Ischemic cardiomyopathy    Melena    Pacemaker    Pulmonary thromboembolism ([place]) 02/15/2019   Allergies  Allergen Reactions   Penicillins Rash   Social History   Socioeconomic History   Marital status: Widowed    Spouse name: Not on file   Number of children: 4   Years of education: Not on file   Highest education level: Not on file  Occupational History   Occupation: retired  Tobacco Use   Smoking status: Former    Pack years: 0.00    Smokeless tobacco: Never  Vaping Use   Vaping Use: Never used  Substance and Sexual Activity   Alcohol use: Never   Drug use: Never   Sexual activity: Not on file    Comment: MARRIED  Other Topics Concern   Not on file  Social History Narrative   Not on file   Social Determinants of Health   Financial Resource Strain: Low Risk    Difficulty of Paying Living Expenses: Not hard at all  Food Insecurity: No Food Insecurity   Worried About Charity fundraiser in the Last Year: Never true   [place] in the Last Year: Never true  Transportation Needs: No Transportation Needs   Lack of Transportation (Medical): No   Lack of Transportation (Non-Medical): No  Physical Activity: Sufficiently Active   Days of Exercise per Week: 5 days   Minutes of Exercise per Session: 30 min  Stress: No Stress Concern Present   Feeling of Stress : Not at all  Social Connections: Moderately Isolated   Frequency of Communication with Friends and Family: More than three times a week   Frequency of Social Gatherings with Friends and Family: More than three times a week   Attends Religious Services: Never   Marine scientist or Organizations: Yes   Attends Archivist Meetings: 1 to 4 times per year   Marital Status: Widowed   Vitals:   09/12/20 0933  BP: 130/76  Pulse: 78  Resp: 16  Temp: 97.9 F (36.6 C)  SpO2: 93%   Body mass index is 28.49 kg/m.  Physical Exam Constitutional:      General: She is not in acute distress.    Appearance: She is well-developed. She is not ill-appearing.  HENT:     Head: Atraumatic.     Comments: Clearing her throat a few times during visit. Post nasal drainage.    Right Ear: Tympanic membrane, ear canal and external ear normal.     Left Ear: Tympanic membrane, ear canal and external ear normal.     Nose: No rhinorrhea.     Right Turbinates: Not enlarged.     Left Turbinates: Not enlarged.     Right Sinus: No maxillary sinus tenderness  or frontal sinus tenderness.     Left Sinus: No maxillary sinus tenderness or frontal sinus tenderness.  Eyes:     Conjunctiva/sclera: Conjunctivae normal.  Cardiovascular:     Rate and Rhythm: Normal rate and regular rhythm.     Heart sounds: No murmur heard. Pulmonary:     Effort: Pulmonary effort is normal. No respiratory distress.     Breath sounds: Normal breath sounds. No stridor.  Musculoskeletal:     Cervical back: No edema or erythema.  Lymphadenopathy:     Head:     Right side of head: No submandibular adenopathy.     Left side  of head: No submandibular adenopathy.     Cervical: No cervical adenopathy.  Skin:    General: Skin is warm.     Findings: No erythema or rash.  Neurological:     Mental Status: She is alert and oriented to person, place, and time.  Psychiatric:        Speech: Speech normal.     Comments: Well groomed, good eye contact.    ASSESSMENT AND PLAN:  Ms.Kyleeann was seen today for head cold.  Diagnoses and all orders for this visit:  Cough We discussed possible etiologies. Explained that cough can last a few more weeks after acute symptoms of URI have resolved. It seems to be gradually improving and lung auscultation is negative, so I do not think imaging is needed but still offered. She agrees with holding on CXR but will be ordered if cough has not resolved in 2 weeks or if problem gets worse. Plain Mucinex may help. Continue Benzonatate 100 mg bid prn.  Dysphagia, unspecified type We discussed possible etiologies. Post nasal drainage could be cause the perception of difficulty swallowing.  GERD on Pantoprazole, she is not having heartburn, no changes for now. Continue GERD precautions. No further work up for now, if persistent we can consider swallowing studies. Instructed about warning signs.  Return if symptoms worsen or fail to improve, for Keep next appt.   Tyiesha Brackney G. Martinique, MD  [facility]. [place] office.   A few  things to remember from today's visit:   Cough  If you need refills please call your pharmacy. Do not use My Chart to request refills or for acute issues that need immediate attention.   Lungs sound clear today. It is appropriate to hold on X ray for now. Plain Mucinex may help. Adequate hydration. For now monitor swallowing  it can be cause by mucus in throat.   Please be sure medication list is accurate. If a new problem present, please set up appointment sooner than planned today.

## 2020-09-12 NOTE — Patient Instructions (Addendum)
A few things to remember from today's visit:   Cough  If you need refills please call your pharmacy. Do not use My Chart to request refills or for acute issues that need immediate attention.   Lungs sound clear today. It is appropriate to hold on X ray for now. Plain Mucinex may help. Adequate hydration. For now monitor swallowing  it can be cause by mucus in throat.   Please be sure medication list is accurate. If a new problem present, please set up appointment sooner than planned today.

## 2020-09-18 ENCOUNTER — Ambulatory Visit (HOSPITAL_COMMUNITY)
Admission: RE | Admit: 2020-09-18 | Discharge: 2020-09-18 | Disposition: A | Discharge status: Home / Self Care | Payer: Medicare Other | Source: Ambulatory Visit | Attending: Adult Health | Admitting: Adult Health

## 2020-09-18 ENCOUNTER — Other Ambulatory Visit: Payer: Self-pay

## 2020-09-18 DIAGNOSIS — I6523 Occlusion and stenosis of bilateral carotid arteries: Secondary | ICD-10-CM | POA: Diagnosis present

## 2020-09-18 NOTE — Progress Notes (Signed)
VASCULAR LAB    Carotid duplex has been performed.  See CV proc for preliminary results.   Tishina Lown, RVT 09/18/2020, 2:25 PM

## 2020-09-27 NOTE — Progress Notes (Signed)
Remote pacemaker transmission.   

## 2020-10-06 ENCOUNTER — Ambulatory Visit (INDEPENDENT_AMBULATORY_CARE_PROVIDER_SITE_OTHER): Payer: Medicare Other

## 2020-10-06 ENCOUNTER — Other Ambulatory Visit: Payer: Self-pay

## 2020-10-06 VITALS — BP 110/62 | HR 65 | Temp 98.0°F | Ht 65.0 in | Wt 178.0 lb

## 2020-10-06 DIAGNOSIS — Z0000 Encounter for general adult medical examination without abnormal findings: Secondary | ICD-10-CM | POA: Diagnosis not present

## 2020-10-06 NOTE — Patient Instructions (Signed)
April Lowery , Thank you for taking time to come for your Medicare Wellness Visit. I appreciate your ongoing commitment to your health goals. Please review the following plan we discussed and let me know if I can assist you in the future.   Screening recommendations/referrals: Colonoscopy: 09/25/2014  due 2026 Mammogram: pt declines  Bone Density: pt declines  Recommended yearly ophthalmology/optometry visit for glaucoma screening and checkup Recommended yearly dental visit for hygiene and checkup  Vaccinations: Influenza vaccine: due in fall 2022 Pneumococcal vaccine: will obtain local pharmacy  Tdap vaccine: due upon injury  Shingles vaccine: will obtain local pharmacy   Advanced directives: will provide copies   Conditions/risks identified: none   Next appointment: none    Preventive Care 73 Years and Older, Female Preventive care refers to lifestyle choices and visits with your health care provider that can promote health and wellness. What does preventive care include? A yearly physical exam. This is also called an annual well check. Dental exams once or twice a year. Routine eye exams. Ask your health care provider how often you should have your eyes checked. Personal lifestyle choices, including: Daily care of your teeth and gums. Regular physical activity. Eating a healthy diet. Avoiding tobacco and drug use. Limiting alcohol use. Practicing safe sex. Taking low-dose aspirin every day. Taking vitamin and mineral supplements as recommended by your health care provider. What happens during an annual well check? The services and screenings done by your health care provider during your annual well check will depend on your age, overall health, lifestyle risk factors, and family history of disease. Counseling  Your health care provider may ask you questions about your: Alcohol use. Tobacco use. Drug use. Emotional well-being. Home and relationship well-being. Sexual  activity. Eating habits. History of falls. Memory and ability to understand (cognition). Work and work Statistician. Reproductive health. Screening  You may have the following tests or measurements: Height, weight, and BMI. Blood pressure. Lipid and cholesterol levels. These may be checked every 5 years, or more frequently if you are over 73 years old. Skin check. Lung cancer screening. You may have this screening every year starting at age 60 if you have a 30-pack-year history of smoking and currently smoke or have quit within the past 15 years. Fecal occult blood test (FOBT) of the stool. You may have this test every year starting at age 16. Flexible sigmoidoscopy or colonoscopy. You may have a sigmoidoscopy every 5 years or a colonoscopy every 10 years starting at age 53. Hepatitis C blood test. Hepatitis B blood test. Sexually transmitted disease (STD) testing. Diabetes screening. This is done by checking your blood sugar (glucose) after you have not eaten for a while (fasting). You may have this done every 1-3 years. Bone density scan. This is done to screen for osteoporosis. You may have this done starting at age 58. Mammogram. This may be done every 1-2 years. Talk to your health care provider about how often you should have regular mammograms. Talk with your health care provider about your test results, treatment options, and if necessary, the need for more tests. Vaccines  Your health care provider may recommend certain vaccines, such as: Influenza vaccine. This is recommended every year. Tetanus, diphtheria, and acellular pertussis (Tdap, Td) vaccine. You may need a Td booster every 10 years. Zoster vaccine. You may need this after age 45. Pneumococcal 13-valent conjugate (PCV13) vaccine. One dose is recommended after age 73. Pneumococcal polysaccharide (PPSV23) vaccine. One dose is recommended after age  73. Talk to your health care provider about which screenings and vaccines  you need and how often you need them. This information is not intended to replace advice given to you by your health care provider. Make sure you discuss any questions you have with your health care provider. Document Released: 04/07/2015 Document Revised: 11/29/2015 Document Reviewed: 01/10/2015 Elsevier Interactive Patient Education  2017 [employer] Prevention in the Home Falls can cause injuries. They can happen to people of all ages. There are many things you can do to make your home safe and to help prevent falls. What can I do on the outside of my home? Regularly fix the edges of walkways and driveways and fix any cracks. Remove anything that might make you trip as you walk through a door, such as a raised step or threshold. Trim any bushes or trees on the path to your home. Use bright outdoor lighting. Clear any walking paths of anything that might make someone trip, such as rocks or tools. Regularly check to see if handrails are loose or broken. Make sure that both sides of any steps have handrails. Any raised decks and porches should have guardrails on the edges. Have any leaves, snow, or ice cleared regularly. Use sand or salt on walking paths during winter. Clean up any spills in your garage right away. This includes oil or grease spills. What can I do in the bathroom? Use night lights. Install grab bars by the toilet and in the tub and shower. Do not use towel bars as grab bars. Use non-skid mats or decals in the tub or shower. If you need to sit down in the shower, use a plastic, non-slip stool. Keep the floor dry. Clean up any water that spills on the floor as soon as it happens. Remove soap buildup in the tub or shower regularly. Attach bath mats securely with double-sided non-slip rug tape. Do not have throw rugs and other things on the floor that can make you trip. What can I do in the bedroom? Use night lights. Make sure that you have a light by your bed that  is easy to reach. Do not use any sheets or blankets that are too big for your bed. They should not hang down onto the floor. Have a firm chair that has side arms. You can use this for support while you get dressed. Do not have throw rugs and other things on the floor that can make you trip. What can I do in the kitchen? Clean up any spills right away. Avoid walking on wet floors. Keep items that you use a lot in easy-to-reach places. If you need to reach something above you, use a strong step stool that has a grab bar. Keep electrical cords out of the way. Do not use floor polish or wax that makes floors slippery. If you must use wax, use non-skid floor wax. Do not have throw rugs and other things on the floor that can make you trip. What can I do with my stairs? Do not leave any items on the stairs. Make sure that there are handrails on both sides of the stairs and use them. Fix handrails that are broken or loose. Make sure that handrails are as long as the stairways. Check any carpeting to make sure that it is firmly attached to the stairs. Fix any carpet that is loose or worn. Avoid having throw rugs at the top or bottom of the stairs. If you do have  throw rugs, attach them to the floor with carpet tape. Make sure that you have a light switch at the top of the stairs and the bottom of the stairs. If you do not have them, ask someone to add them for you. What else can I do to help prevent falls? Wear shoes that: Do not have high heels. Have rubber bottoms. Are comfortable and fit you well. Are closed at the toe. Do not wear sandals. If you use a stepladder: Make sure that it is fully opened. Do not climb a closed stepladder. Make sure that both sides of the stepladder are locked into place. Ask someone to hold it for you, if possible. Clearly mark and make sure that you can see: Any grab bars or handrails. First and last steps. Where the edge of each step is. Use tools that help you  move around (mobility aids) if they are needed. These include: Canes. Walkers. Scooters. Crutches. Turn on the lights when you go into a dark area. Replace any light bulbs as soon as they burn out. Set up your furniture so you have a clear path. Avoid moving your furniture around. If any of your floors are uneven, fix them. If there are any pets around you, be aware of where they are. Review your medicines with your doctor. Some medicines can make you feel dizzy. This can increase your chance of falling. Ask your doctor what other things that you can do to help prevent falls. This information is not intended to replace advice given to you by your health care provider. Make sure you discuss any questions you have with your health care provider. Document Released: 01/05/2009 Document Revised: 08/17/2015 Document Reviewed: 04/15/2014 Elsevier Interactive Patient Education  2017 [employer].

## 2020-10-06 NOTE — Progress Notes (Signed)
Subjective:   April Lowery is a 75 y.o. female who presents for an Initial Medicare Annual Wellness Visit.  Review of Systems    N/a       Objective:    There were no vitals filed for this visit. There is no height or weight on file to calculate BMI.  Advanced Directives 10/21/2019 10/07/2019 10/06/2019  Does Patient Have a Medical Advance Directive? Yes Yes Yes  Type of Paramedic of [place];Living will [place];Living will [place];Living will  Does patient want to make changes to medical advance directive? - No - Patient declined -  Copy of [place] in Chart? - - No - copy requested    Current Medications (verified) Outpatient Encounter Medications as of 10/06/2020  Medication Sig   acetaminophen (TYLENOL) 650 MG CR tablet Take 650 mg by mouth every 8 (eight) hours as needed for pain.   aspirin EC 81 MG tablet Take 81 mg by mouth daily. Swallow whole.   cholecalciferol (VITAMIN D3) 25 MCG (1000 UT) tablet Take 1,000 Units by mouth daily.   clopidogrel (PLAVIX) 75 MG tablet TAKE 1 TABLET BY MOUTH EVERY DAY   Cyanocobalamin (B-12) 2500 MCG TABS Take 1 tablet by mouth daily.   ezetimibe (ZETIA) 10 MG tablet Take 1 tablet (10 mg total) by mouth daily. Please make yearly appt with Dr. Lovena Le for July 2022 before anymore refills. Thank you 1st attempt   furosemide (LASIX) 20 MG tablet TAKE 2 TABLETS BY MOUTH DAILY FOR 5 DAYS THEN 1 TABLET DAILY   metoprolol succinate (TOPROL-XL) 50 MG 24 hr tablet TAKE 1 TABLET (50 MG TOTAL) BY MOUTH DAILY. TAKE WITH OR IMMEDIATELY FOLLOWING A MEAL.   pantoprazole (PROTONIX) 40 MG tablet Take 1 tablet (40 mg total) by mouth daily.   rosuvastatin (CRESTOR) 40 MG tablet TAKE 1 TABLET BY MOUTH EVERYDAY AT BEDTIME   sertraline (ZOLOFT) 50 MG tablet Take 1 tablet (50 mg total) by mouth daily.   No facility-administered encounter medications on file as of 10/06/2020.     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   HLD (hyperlipidemia) 02/15/2019   HTN (hypertension) 02/15/2019   Ischemic cardiomyopathy    Melena    Pacemaker    Pulmonary thromboembolism ([place]) 02/15/2019   Past Surgical History:  Procedure Laterality Date   CHOLECYSTECTOMY     CORONARY ARTERY BYPASS GRAFT     STENTS   PACEMAKER INSERTION     TONSILLECTOMY AND ADENOIDECTOMY  2017   Family History  Problem Relation Age of Onset   Heart disease Mother    Heart attack Mother    Heart disease Father    Heart attack Father    Heart disease Brother    Heart attack Brother    Heart attack Maternal Grandmother    Heart attack Maternal Grandfather    Heart attack Paternal Grandmother    Heart attack Paternal Grandfather    Social History   Socioeconomic History   Marital status: Widowed    Spouse name: Not on file   Number of children: 4  Years of education: Not on file   Highest education level: Not on file  Occupational History   Occupation: retired  Tobacco Use   Smoking status: Former   Smokeless tobacco: Never  Scientific laboratory technician Use: Never used  Substance and Sexual Activity   Alcohol use: Never   Drug use: Never   Sexual activity: Not on file    Comment: MARRIED  Other Topics Concern   Not on file  Social History Narrative   Not on file   Social Determinants of Health   Financial Resource Strain: Low Risk    Difficulty of Paying Living Expenses: Not hard at all  Food Insecurity: No Food Insecurity   Worried About Charity fundraiser in the Last Year: Never true   [place] in the Last Year: Never true   Transportation Needs: No Transportation Needs   Lack of Transportation (Medical): No   Lack of Transportation (Non-Medical): No  Physical Activity: Not on file  Stress: No Stress Concern Present   Feeling of Stress : Not at all  Social Connections: Moderately Isolated   Frequency of Communication with Friends and Family: More than three times a week   Frequency of Social Gatherings with Friends and Family: More than three times a week   Attends Religious Services: Never   Marine scientist or Organizations: Yes   Attends Archivist Meetings: 1 to 4 times per year   Marital Status: Widowed    Tobacco Counseling Counseling given: Not Answered   Clinical Intake:                 Diabetic?yes Nutrition Risk Assessment:  Has the patient had any N/V/D within the last 2 months?  Yes  Does the patient have any non-healing wounds?  No  Has the patient had any unintentional weight loss or weight gain?  No   Diabetes:  Is the patient diabetic?  Yes  If diabetic, was a CBG obtained today?  No  Did the patient bring in their glucometer from home?  No  How often do you monitor your CBG's? Once week.   Financial Strains and Diabetes Management:  Are you having any financial strains with the device, your supplies or your medication? No .  Does the patient want to be seen by Chronic Care Management for management of their diabetes?  No  Would the patient like to be referred to a Nutritionist or for Diabetic Management?  No   Diabetic Exams:  Diabetic Eye Exam: Completed 12/2019 Diabetic Foot Exam: Overdue, Pt has been advised about the importance in completing this exam. Pt is scheduled for diabetic foot exam on next office visit .          Activities of Daily Living In your present state of health, do you have any difficulty performing the following activities: 10/07/2019  Hearing? N  Vision? N  Difficulty concentrating or making decisions? Y  Walking  or climbing stairs? N  Dressing or bathing? N  Doing errands, shopping? N  Some recent data might be hidden    Patient Care Team: Martinique, Betty G, MD as PCP - General (Family Medicine) Evans Lance, MD as PCP - Cardiology (Cardiology) Frann Rider, NP as Nurse Practitioner (Neurology) Garvin Fila, MD as Consulting Physician (Neurology)  Indicate any recent Medical Services you may have received from other than [facility] providers in the past year (date may be approximate).     Assessment:  This is a routine wellness examination for [place].  Hearing/Vision screen No results found.  Dietary issues and exercise activities discussed:     Goals Addressed   None    Depression Screen PHQ 2/9 Scores 06/27/2020 04/06/2020 12/01/2019 11/04/2019 10/21/2019 10/06/2019 09/30/2019  PHQ - 2 Score 0 0 0 1 1 0 0    Fall Risk Fall Risk  06/27/2020 04/06/2020 10/21/2019 10/06/2019 09/30/2019  Falls in the past year? 0 0 0 0 0  Number falls in past yr: 0 0 0 0 0  Injury with Fall? 0 0 0 0 0  Risk for fall due to : - - Impaired balance/gait Impaired balance/gait;Impaired vision;Impaired mobility -  Follow up - Education provided Falls evaluation completed;Falls prevention discussed Falls prevention discussed Education provided    FALL RISK PREVENTION PERTAINING TO THE HOME:  Any stairs in or around the home? Yes  If so, are there any without handrails? No  Home free of loose throw rugs in walkways, pet beds, electrical cords, etc? Yes  Adequate lighting in your home to reduce risk of falls? Yes   ASSISTIVE DEVICES UTILIZED TO PREVENT FALLS:  Life alert? No  Use of a cane, walker or w/c? No  Grab bars in the bathroom? Yes  Shower chair or bench in shower? Yes  Elevated toilet seat or a handicapped toilet? Yes   TIMED UP AND GO:  Was the test performed? Yes .  Length of time to ambulate 10 feet: 7 sec.   Gait steady and fast without use of assistive device  Cognitive Function:     6CIT  Screen 10/06/2019  What Year? 0 points  What month? 0 points  What time? 0 points  Count back from 20 0 points  Months in reverse 0 points  Repeat phrase 0 points  Total Score 0    Immunizations Immunization History  Administered Date(s) Administered   PFIZER(Purple Top)SARS-COV-2 Vaccination 07/05/2019, 07/27/2019    TDAP status: Due, Education has been provided regarding the importance of this vaccine. Advised may receive this vaccine at local pharmacy or Health Dept. Aware to provide a copy of the vaccination record if obtained from local pharmacy or Health Dept. Verbalized acceptance and understanding.  Flu Vaccine status: Up to date  Pneumococcal vaccine status: Up to date  Covid-19 vaccine status: Completed vaccines  Qualifies for Shingles Vaccine? Yes   Zostavax completed No   Shingrix Completed?: No.    Education has been provided regarding the importance of this vaccine. Patient has been advised to call insurance company to determine out of pocket expense if they have not yet received this vaccine. Advised may also receive vaccine at local pharmacy or Health Dept. Verbalized acceptance and understanding.  Screening Tests Health Maintenance  Topic Date Due   MAMMOGRAM  Never done   Zoster Vaccines- Shingrix (1 of 2) Never done   DEXA SCAN  Never done   COVID-19 Vaccine (3 - Booster for Pfizer series) 12/27/2019   HEMOGLOBIN A1C  07/18/2020   FOOT EXAM  09/28/2020   INFLUENZA VACCINE  10/23/2020   OPHTHALMOLOGY EXAM  01/03/2021   URINE MICROALBUMIN  04/03/2021   COLONOSCOPY (Pts 45-62yrs Insurance coverage will need to be confirmed)  09/24/2024   Hepatitis C Screening  Completed   HPV VACCINES  Aged Out   TETANUS/TDAP  Discontinued   PNA vac Low Risk Adult  Discontinued    Health Maintenance  Health Maintenance Due  Topic Date Due   MAMMOGRAM  Never done  Zoster Vaccines- Shingrix (1 of 2) Never done   DEXA SCAN  Never done   COVID-19 Vaccine (3 - Booster  for Pfizer series) 12/27/2019   HEMOGLOBIN A1C  07/18/2020   FOOT EXAM  09/28/2020    Colorectal cancer screening: Type of screening: Colonoscopy. Completed 09/25/2014. Repeat every 10 years  Mammogram status: Ordered pt declines . Pt provided with contact info and advised to call to schedule appt.   Bone Density status: Ordered pt declines . Pt provided with contact info and advised to call to schedule appt.  Lung Cancer Screening: (Low Dose CT Chest recommended if Age 54-80 years, 30 pack-year currently smoking OR have quit w/in 15years.) does not qualify.   Lung Cancer Screening Referral: n/a  Additional Screening:  Hepatitis C Screening: does qualify  Vision Screening: Recommended annual ophthalmology exams for early detection of glaucoma and other disorders of the eye. Is the patient up to date with their annual eye exam?  Yes  Who is the provider or what is the name of the office in which the patient attends annual eye exams? [facility]  If pt is not established with a provider, would they like to be referred to a provider to establish care? No .   Dental Screening: Recommended annual dental exams for proper oral hygiene  Community Resource Referral / Chronic Care Management: CRR required this visit?  No   CCM required this visit?  No      Plan:     I have personally reviewed and noted the following in the patient's chart:   Medical and social history Use of alcohol, tobacco or illicit drugs  Current medications and supplements including opioid prescriptions. Patient is not currently taking opioid prescriptions. Functional ability and status Nutritional status Physical activity Advanced directives List of other physicians Hospitalizations, surgeries, and ER visits in previous 12 months Vitals Screenings to include cognitive, depression, and falls Referrals and appointments  In addition, I have reviewed and discussed with patient certain preventive  protocols, quality metrics, and best practice recommendations. A written personalized care plan for preventive services as well as general preventive health recommendations were provided to patient.     Randel Pigg, LPN   [id number]   Nurse Notes: none

## 2020-11-17 ENCOUNTER — Encounter: Payer: Self-pay | Admitting: Family Medicine

## 2020-11-20 ENCOUNTER — Other Ambulatory Visit: Payer: Self-pay | Admitting: Internal Medicine

## 2020-11-22 ENCOUNTER — Other Ambulatory Visit: Payer: Self-pay | Admitting: Internal Medicine

## 2020-11-26 ENCOUNTER — Other Ambulatory Visit: Payer: Self-pay | Admitting: Internal Medicine

## 2020-12-03 ENCOUNTER — Other Ambulatory Visit: Payer: Self-pay | Admitting: Family Medicine

## 2020-12-05 ENCOUNTER — Ambulatory Visit (INDEPENDENT_AMBULATORY_CARE_PROVIDER_SITE_OTHER): Payer: Medicare Other

## 2020-12-05 DIAGNOSIS — I255 Ischemic cardiomyopathy: Secondary | ICD-10-CM | POA: Diagnosis not present

## 2020-12-05 LAB — CUP PACEART REMOTE DEVICE CHECK
Battery Remaining Longevity: 141 mo
Battery Voltage: 3.02 V
Brady Statistic AP VP Percent: 6.35 %
Brady Statistic AP VS Percent: 24.66 %
Brady Statistic AS VP Percent: 2.93 %
Brady Statistic AS VS Percent: 66.06 %
Brady Statistic RA Percent Paced: 31.1 %
Brady Statistic RV Percent Paced: 9.28 %
Date Time Interrogation Session: 20220913052155
Implantable Lead Implant Date: 20191121
Implantable Lead Implant Date: 20191121
Implantable Lead Location: 753859
Implantable Lead Location: 753860
Implantable Lead Model: 4076
Implantable Lead Model: 4076
Implantable Pulse Generator Implant Date: 20191121
Lead Channel Impedance Value: 323 Ohm
Lead Channel Impedance Value: 399 Ohm
Lead Channel Impedance Value: 475 Ohm
Lead Channel Impedance Value: 532 Ohm
Lead Channel Pacing Threshold Amplitude: 0.75 V
Lead Channel Pacing Threshold Amplitude: 0.875 V
Lead Channel Pacing Threshold Pulse Width: 0.4 ms
Lead Channel Pacing Threshold Pulse Width: 0.4 ms
Lead Channel Sensing Intrinsic Amplitude: 2.375 mV
Lead Channel Sensing Intrinsic Amplitude: 2.375 mV
Lead Channel Sensing Intrinsic Amplitude: 24 mV
Lead Channel Sensing Intrinsic Amplitude: 24 mV
Lead Channel Setting Pacing Amplitude: 1.75 V
Lead Channel Setting Pacing Amplitude: 2 V
Lead Channel Setting Pacing Pulse Width: 0.4 ms
Lead Channel Setting Sensing Sensitivity: 1.2 mV

## 2020-12-06 ENCOUNTER — Other Ambulatory Visit: Payer: Self-pay | Admitting: Internal Medicine

## 2020-12-13 NOTE — Progress Notes (Signed)
Remote pacemaker transmission.   

## 2020-12-21 ENCOUNTER — Other Ambulatory Visit: Payer: Self-pay | Admitting: Family Medicine

## 2020-12-23 ENCOUNTER — Other Ambulatory Visit: Payer: Self-pay | Admitting: Internal Medicine

## 2020-12-25 ENCOUNTER — Other Ambulatory Visit: Payer: Self-pay | Admitting: Internal Medicine

## 2020-12-28 ENCOUNTER — Encounter: Payer: Self-pay | Admitting: Adult Health

## 2020-12-28 MED ORDER — CLOPIDOGREL BISULFATE 75 MG PO TABS: 75.0000 mg | ORAL_TABLET | Freq: Every day | ORAL | 1 refills | Status: DC

## 2020-12-28 MED ORDER — EZETIMIBE 10 MG PO TABS: 10.0000 mg | ORAL_TABLET | Freq: Every day | ORAL | 1 refills | Status: DC

## 2020-12-28 NOTE — Telephone Encounter (Signed)
If she is referring to her Protonix, then yes that should be refilled by her PCP

## 2021-01-23 ENCOUNTER — Ambulatory Visit: Payer: Medicare Other | Admitting: Adult Health

## 2021-02-04 ENCOUNTER — Other Ambulatory Visit: Payer: Self-pay | Admitting: Family Medicine

## 2021-02-23 ENCOUNTER — Other Ambulatory Visit: Payer: Self-pay | Admitting: Family Medicine

## 2021-03-06 ENCOUNTER — Ambulatory Visit (INDEPENDENT_AMBULATORY_CARE_PROVIDER_SITE_OTHER): Payer: Medicare Other

## 2021-03-06 DIAGNOSIS — I255 Ischemic cardiomyopathy: Secondary | ICD-10-CM

## 2021-03-06 LAB — CUP PACEART REMOTE DEVICE CHECK
Battery Remaining Longevity: 137 mo
Battery Voltage: 3.02 V
Brady Statistic AP VP Percent: 17.36 %
Brady Statistic AP VS Percent: 41.88 %
Brady Statistic AS VP Percent: 9.56 %
Brady Statistic AS VS Percent: 31.19 %
Brady Statistic RA Percent Paced: 59.37 %
Brady Statistic RV Percent Paced: 26.93 %
Date Time Interrogation Session: 20221212201719
Implantable Lead Implant Date: 20191121
Implantable Lead Implant Date: 20191121
Implantable Lead Location: 753859
Implantable Lead Location: 753860
Implantable Lead Model: 4076
Implantable Lead Model: 4076
Implantable Pulse Generator Implant Date: 20191121
Lead Channel Impedance Value: 304 Ohm
Lead Channel Impedance Value: 380 Ohm
Lead Channel Impedance Value: 399 Ohm
Lead Channel Impedance Value: 437 Ohm
Lead Channel Pacing Threshold Amplitude: 0.75 V
Lead Channel Pacing Threshold Amplitude: 0.75 V
Lead Channel Pacing Threshold Pulse Width: 0.4 ms
Lead Channel Pacing Threshold Pulse Width: 0.4 ms
Lead Channel Sensing Intrinsic Amplitude: 2.25 mV
Lead Channel Sensing Intrinsic Amplitude: 2.25 mV
Lead Channel Sensing Intrinsic Amplitude: 23.375 mV
Lead Channel Sensing Intrinsic Amplitude: 23.375 mV
Lead Channel Setting Pacing Amplitude: 1.5 V
Lead Channel Setting Pacing Amplitude: 2 V
Lead Channel Setting Pacing Pulse Width: 0.4 ms
Lead Channel Setting Sensing Sensitivity: 1.2 mV

## 2021-03-16 NOTE — Progress Notes (Signed)
Remote pacemaker transmission.   

## 2021-03-30 ENCOUNTER — Encounter: Payer: Self-pay | Admitting: Family Medicine

## 2021-03-30 ENCOUNTER — Encounter: Payer: Self-pay | Admitting: Internal Medicine

## 2021-03-30 NOTE — Telephone Encounter (Signed)
I am sorry, I do not know any physician around that area. Akeya Ryther [place], MD

## 2021-05-21 ENCOUNTER — Other Ambulatory Visit: Payer: Self-pay | Admitting: Family Medicine

## 2021-05-24 ENCOUNTER — Other Ambulatory Visit: Payer: Self-pay | Admitting: Internal Medicine

## 2021-05-30 ENCOUNTER — Encounter: Payer: Self-pay | Admitting: Internal Medicine

## 2021-06-06 ENCOUNTER — Ambulatory Visit (INDEPENDENT_AMBULATORY_CARE_PROVIDER_SITE_OTHER): Payer: Medicare HMO

## 2021-06-06 DIAGNOSIS — I255 Ischemic cardiomyopathy: Secondary | ICD-10-CM

## 2021-06-06 DIAGNOSIS — I442 Atrioventricular block, complete: Secondary | ICD-10-CM

## 2021-06-06 LAB — CUP PACEART REMOTE DEVICE CHECK
Battery Remaining Longevity: 133 mo
Battery Voltage: 3.02 V
Brady Statistic AP VP Percent: 16.57 %
Brady Statistic AP VS Percent: 37.34 %
Brady Statistic AS VP Percent: 9.62 %
Brady Statistic AS VS Percent: 36.46 %
Brady Statistic RA Percent Paced: 53.92 %
Brady Statistic RV Percent Paced: 26.19 %
Date Time Interrogation Session: 20230314215300
Implantable Lead Implant Date: 20191121
Implantable Lead Implant Date: 20191121
Implantable Lead Location: 753859
Implantable Lead Location: 753860
Implantable Lead Model: 4076
Implantable Lead Model: 4076
Implantable Pulse Generator Implant Date: 20191121
Lead Channel Impedance Value: 342 Ohm
Lead Channel Impedance Value: 380 Ohm
Lead Channel Impedance Value: 399 Ohm
Lead Channel Impedance Value: 456 Ohm
Lead Channel Pacing Threshold Amplitude: 0.625 V
Lead Channel Pacing Threshold Amplitude: 0.875 V
Lead Channel Pacing Threshold Pulse Width: 0.4 ms
Lead Channel Pacing Threshold Pulse Width: 0.4 ms
Lead Channel Sensing Intrinsic Amplitude: 2.625 mV
Lead Channel Sensing Intrinsic Amplitude: 2.625 mV
Lead Channel Sensing Intrinsic Amplitude: 27.625 mV
Lead Channel Sensing Intrinsic Amplitude: 27.625 mV
Lead Channel Setting Pacing Amplitude: 1.5 V
Lead Channel Setting Pacing Amplitude: 2 V
Lead Channel Setting Pacing Pulse Width: 0.4 ms
Lead Channel Setting Sensing Sensitivity: 1.2 mV

## 2021-06-17 NOTE — Progress Notes (Signed)
? ?Chief Complaint  ?Patient presents with  ? Annual Exam  ? Follow-up  ? ?HPI: ?April Lowery is a 76 y.o. 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 ?Last seen on 09/12/2020. ? ?She follows with cardiologist and neurologist regularly. ? ?Regular exercise: Walking daily 15-30 min daily for the past 7 months, she was [place].She is going back because in general she feels much better while staying there, even joint pain has improved. ?Diet: Small portions, her daughter-in-law cooks for her. ?She drives. ? ?Health Maintenance  ?Topic Date Due  ? Zoster (Shingles) Vaccine (1 of 2) Never done  ? Pneumonia Vaccine (2 - PPSV23 if available, else PCV20) 12/23/2013  ? COVID-19 Vaccine (3 - Booster for Pfizer series) 09/21/2019  ? Eye exam for diabetics  01/03/2021  ? Flu Shot  06/22/2021*  ? Hemoglobin A1C  12/19/2021  ? Complete foot exam   06/19/2022  ? Urine Protein Check  06/19/2022  ? Colon Cancer Screening  09/24/2024  ? Hepatitis C Screening: USPSTF Recommendation to screen - Ages 6-79 yo.  Completed  ? HPV Vaccine  Aged Out  ? Mammogram  Discontinued  ? DEXA scan (bone density measurement)  Discontinued  ? Tetanus Vaccine  Discontinued  ?*Topic was postponed. The date shown is not the original due date.  ? ?Reporting DEXA years ago and normal. ?She is not interested in having more mammograms or DEXA. ?She takes Vit D supplementation. ?Ca++ through her diet. ? ?Immunization History  ?Administered Date(s) Administered  ? PFIZER(Purple Top)SARS-COV-2 Vaccination 07/05/2019, 07/27/2019  ? Pneumococcal Conjugate-13 12/23/2012  ? ?Hypertension: Currently she is on metoprolol succinate 50 mg daily. ?HFrEF, she is on Furosemide 20 mg daily. ?CAD and CVA, takes Plavix 75 mg daily. ?She has an appt with cardiologist tomorrow. ?Negative for  orthopnea, PND, worsening dyspnea. ?Dyspnea with exertion, stable. ?No associated CP, palpitation, or diaphoresis. ? ?Lab Results  ?Component Value Date  ? CREATININE 0.99 04/03/2020  ? BUN 20 04/03/2020  ? NA 139 04/03/2020  ? K 3.5 04/03/2020  ? CL 100 04/03/2020  ? CO2 30 04/03/2020  ? ?Diabetes Mellitus II: Dx'ed in 01/2018. ?BS's 110's. ?She is on nonpharmacologic treatment. ? ?Lab Results  ?Component Value Date  ? HGBA1C 6.8 (H) 01/18/2020  ? ?Lab Results  ?Component Value Date  ? MICROALBUR 1.4 04/03/2020  ? ?Hyperlipidemia: She is on Crestor 40 mg daily and Zetia 10 mg daily. ?Lab Results  ?Component Value Date  ? CHOL 143 10/08/2019  ? HDL 35 (L) 10/08/2019  ? [place] 71 10/08/2019  ? TRIG 183 (H) 10/08/2019  ? CHOLHDL 4.1 10/08/2019  ? ?Anxiety on sertraline 50 mg daily, she reports symptoms is well controlled. ?B12 deficiency: Currently she is on B12 supplementation at 1000 mcg daily, dose was decreased last years from 2500 mcg daily. ?Lab Results  ?Component Value Date  ? YDXAJOIN86 1,154 (H) 04/03/2020  ? ?Scalp laceration in 12/2020 after hitting head against a tree branch while in [place]. She was bent dow and hit tree when stood up, no LOC or post traumatic headache,she did not seek medical attention.Lesion bleeds sometimes. She frequently irritates area when combing or washing her hair. ? ?Review of Systems  ?Constitutional:  Positive for fatigue. Negative for activity change, appetite change and fever.  ?HENT:  Negative for mouth sores, nosebleeds and sore throat.   ?  Eyes:  Negative for redness and visual disturbance.  ?Respiratory:  Negative for cough and wheezing.   ?Cardiovascular:  Negative for palpitations and leg swelling.  ?Gastrointestinal:  Negative for abdominal pain, nausea and vomiting.  ?     Negative for changes in bowel habits.  ?Endocrine: Negative for cold intolerance, heat intolerance, polydipsia, polyphagia and polyuria.  ?Genitourinary:  Negative for decreased urine volume,  dysuria and hematuria.  ?Musculoskeletal:  Positive for arthralgias and gait problem.  ?Skin:  Negative for rash.  ?Neurological:  Positive for dizziness and numbness (RLE and RUE). Negative for syncope and headaches.  ?Psychiatric/Behavioral:  Negative for behavioral problems and confusion.   ?All other systems reviewed and are negative. ? ?Current Outpatient Medications on File Prior to Visit  ?Medication Sig Dispense Refill  ? acetaminophen (TYLENOL) 650 MG CR tablet Take 650 mg by mouth every 8 (eight) hours as needed for pain.    ? aspirin EC 81 MG tablet Take 81 mg by mouth daily. Swallow whole.    ? cholecalciferol (VITAMIN D3) 25 MCG (1000 UT) tablet Take 1,000 Units by mouth daily.    ? clopidogrel (PLAVIX) 75 MG tablet Take 1 tablet (75 mg total) by mouth daily. 90 tablet 1  ? Cyanocobalamin (B-12) 2500 MCG TABS Take 1 tablet by mouth daily.    ? ezetimibe (ZETIA) 10 MG tablet Take 1 tablet (10 mg total) by mouth daily. 90 tablet 1  ? furosemide (LASIX) 20 MG tablet TAKE 2 TABLETS BY MOUTH DAILY FOR 5 DAYS THEN 1 TABLET DAILY 30 tablet 1  ? metoprolol succinate (TOPROL-XL) 50 MG 24 hr tablet TAKE 1 TABLET BY MOUTH DAILY. TAKE WITH OR IMMEDIATELY FOLLOWING A MEAL. 90 tablet 1  ? pantoprazole (PROTONIX) 40 MG tablet Take 1 tablet (40 mg total) by mouth daily. 90 tablet 3  ? rosuvastatin (CRESTOR) 40 MG tablet TAKE 1 TABLET BY MOUTH EVERYDAY AT BEDTIME 90 tablet 3  ? sertraline (ZOLOFT) 50 MG tablet TAKE 1 TABLET BY MOUTH EVERY DAY 90 tablet 1  ? ?No current facility-administered medications on file prior to visit.  ? ? ?Past Medical History:  ?Diagnosis Date  ? Acute ST segment elevation myocardial infarction [facility])   ? Arthritis   ? CAD (coronary artery disease) 02/15/2019  ? Cancer of left kidney ([place])   ? Complete atrioventricular block (HCC)   ? DVT (deep venous thrombosis) ([place]) 02/15/2019  ? GI bleeding   ? History of cerebellar stroke 02/15/2019  ? HLD (hyperlipidemia) 02/15/2019  ? HTN (hypertension)  02/15/2019  ? Ischemic cardiomyopathy   ? Melena   ? Pacemaker   ? Pulmonary thromboembolism ([place]) 02/15/2019  ? ?Allergies  ?Allergen Reactions  ? Penicillins Rash  ? ?Social History  ? ?Socioeconomic History  ? Marital status: Widowed  ?  Spouse name: Not on file  ? Number of children: 4  ? Years of education: Not on file  ? Highest education level: Not on file  ?Occupational History  ? Occupation: retired  ?Tobacco Use  ? Smoking status: Former  ? Smokeless tobacco: Never  ?Vaping Use  ? Vaping Use: Never used  ?Substance and Sexual Activity  ? Alcohol use: Never  ? Drug use: Never  ? Sexual activity: Not on file  ?  Comment: MARRIED  ?Other Topics Concern  ? Not on file  ?Social History Narrative  ? Not on file  ? ?Social Determinants of Health  ? ?Financial Resource Strain: Low Risk   ? Difficulty of  Paying Living Expenses: Not hard at all  ?Food Insecurity: No Food Insecurity  ? Worried About Charity fundraiser in the Last Year: Never true  ? Ran Out of Food in the Last Year: Never true  ?Transportation Needs: No Transportation Needs  ? Lack of Transportation (Medical): No  ? Lack of Transportation (Non-Medical): No  ?Physical Activity: Inactive  ? Days of Exercise per Week: 0 days  ? Minutes of Exercise per Session: 0 min  ?Stress: No Stress Concern Present  ? Feeling of Stress : Not at all  ?Social Connections: Moderately Isolated  ? Frequency of Communication with Friends and Family: More than three times a week  ? Frequency of Social Gatherings with Friends and Family: More than three times a week  ? Attends Religious Services: Never  ? Active Member of Clubs or Organizations: Yes  ? Attends Archivist Meetings: 1 to 4 times per year  ? Marital Status: Widowed  ? ? ?Today's Vitals  ? 06/18/21 0720 06/18/21 0753  ?BP: (!) 150/66 (!) 120/58  ?Pulse: 66   ?Resp: 16   ?Temp: 98.1 ?F (36.7 ?C)   ?TempSrc: Oral   ?SpO2: 97%   ?Weight: 172 lb (78 kg)   ?Height: 5' 2.99" (1.6 m)   ? ?Wt Readings from  Last 3 Encounters:  ?06/18/21 172 lb (78 kg)  ?10/06/20 178 lb (80.7 kg)  ?09/12/20 171 lb 3.2 oz (77.7 kg)  ? ?Body mass index is 30.48 kg/m?. ? ?Physical Exam ?Vitals and nursing note reviewed.  ?[place]

## 2021-06-18 ENCOUNTER — Encounter: Payer: Self-pay | Admitting: Family Medicine

## 2021-06-18 ENCOUNTER — Ambulatory Visit (INDEPENDENT_AMBULATORY_CARE_PROVIDER_SITE_OTHER): Payer: Medicare HMO | Admitting: Family Medicine

## 2021-06-18 VITALS — BP 120/58 | HR 66 | Temp 98.1°F | Resp 16 | Ht 62.99 in | Wt 172.0 lb

## 2021-06-18 DIAGNOSIS — E785 Hyperlipidemia, unspecified: Secondary | ICD-10-CM

## 2021-06-18 DIAGNOSIS — I5020 Unspecified systolic (congestive) heart failure: Secondary | ICD-10-CM | POA: Diagnosis not present

## 2021-06-18 DIAGNOSIS — E782 Mixed hyperlipidemia: Secondary | ICD-10-CM | POA: Diagnosis not present

## 2021-06-18 DIAGNOSIS — I10 Essential (primary) hypertension: Secondary | ICD-10-CM

## 2021-06-18 DIAGNOSIS — E538 Deficiency of other specified B group vitamins: Secondary | ICD-10-CM | POA: Diagnosis not present

## 2021-06-18 DIAGNOSIS — S0101XA Laceration without foreign body of scalp, initial encounter: Secondary | ICD-10-CM

## 2021-06-18 DIAGNOSIS — Z0000 Encounter for general adult medical examination without abnormal findings: Secondary | ICD-10-CM

## 2021-06-18 DIAGNOSIS — E1169 Type 2 diabetes mellitus with other specified complication: Secondary | ICD-10-CM

## 2021-06-18 LAB — COMPREHENSIVE METABOLIC PANEL
ALT: 16 U/L (ref 0–35)
AST: 21 U/L (ref 0–37)
Albumin: 4.3 g/dL (ref 3.5–5.2)
Alkaline Phosphatase: 96 U/L (ref 39–117)
BUN: 18 mg/dL (ref 6–23)
CO2: 32 mEq/L (ref 19–32)
Calcium: 9.8 mg/dL (ref 8.4–10.5)
Chloride: 101 mEq/L (ref 96–112)
Creatinine, Ser: 0.98 mg/dL (ref 0.40–1.20)
GFR: 57.11 mL/min — ABNORMAL LOW (ref >60.00)
Glucose, Bld: 113 mg/dL — ABNORMAL HIGH (ref 70–99)
Potassium: 4.3 mEq/L (ref 3.5–5.1)
Sodium: 138 mEq/L (ref 135–145)
Total Bilirubin: 0.3 mg/dL (ref 0.2–1.2)
Total Protein: 7.6 g/dL (ref 6.0–8.3)

## 2021-06-18 LAB — MICROALBUMIN / CREATININE URINE RATIO
Creatinine,U: 88.3 mg/dL
Microalb Creat Ratio: 0.8 mg/g (ref 0.0–30.0)
Microalb, Ur: <0.7 mg/dL (ref 0.0–1.9)

## 2021-06-18 LAB — LIPID PANEL
Cholesterol: 206 mg/dL — ABNORMAL HIGH (ref 0–200)
HDL: 44.1 mg/dL (ref >39.00)
NonHDL: 161.77
Total CHOL/HDL Ratio: 5
Triglycerides: 210 mg/dL — ABNORMAL HIGH (ref 0.0–149.0)
VLDL: 42 mg/dL — ABNORMAL HIGH (ref 0.0–40.0)

## 2021-06-18 LAB — HEMOGLOBIN A1C: Hgb A1c MFr Bld: 7.2 % — ABNORMAL HIGH (ref 4.6–6.5)

## 2021-06-18 LAB — LDL CHOLESTEROL, DIRECT: Direct LDL: 140 mg/dL

## 2021-06-18 NOTE — Patient Instructions (Addendum)
A few things to remember from today's visit: ? ?Routine general medical examination at a health care facility ? ?Type 2 diabetes mellitus with other specified complication, without long-term current use of insulin (HCC) - Plan: Comprehensive metabolic panel, Hemoglobin A1c, Microalbumin / creatinine urine ratio ? ?Hyperlipidemia, unspecified hyperlipidemia type - Plan: Comprehensive metabolic panel, Lipid panel ? ?HFrEF (heart failure with reduced ejection fraction) ([place]), Chronic ? ?B12 deficiency - Plan: Vitamin B12 ? ?Laceration of scalp without foreign body, initial encounter ? ?If you need refills please call your pharmacy. ?Do not use My Chart to request refills or for acute issues that need immediate attention. ?  ?In regard to scalp lesion, try not to accidentally irritated, so it can heal. Small amount of Vaseline or [place] to keep it moist and sun protection. ? ?Please be sure medication list is accurate. ?If a new problem present, please set up appointment sooner than planned today. ? ?Preventive Care 44 Years and Older, Female ?Preventive care refers to lifestyle choices and visits with your health care provider that can promote health and wellness. Preventive care visits are also called wellness exams. ?What can I expect for my preventive care visit? ?Counseling ?Your health care provider may ask you questions about your: ?Medical history, including: ?Past medical problems. ?Family medical history. ?Pregnancy and menstrual history. ?History of falls. ?Current health, including: ?Memory and ability to understand (cognition). ?Emotional well-being. ?Home life and relationship well-being. ?Sexual activity and sexual health. ?Lifestyle, including: ?Alcohol, nicotine or tobacco, and drug use. ?Access to firearms. ?Diet, exercise, and sleep habits. ?Work and work Statistician. ?Sunscreen use. ?Safety issues such as seatbelt and bike helmet use. ?Physical exam ?Your health care provider will check your: ?Height and  weight. These may be used to calculate your BMI (body mass index). BMI is a measurement that tells if you are at a healthy weight. ?Waist circumference. This measures the distance around your waistline. This measurement also tells if you are at a healthy weight and may help predict your risk of certain diseases, such as type 2 diabetes and high blood pressure. ?Heart rate and blood pressure. ?Body temperature. ?Skin for abnormal spots. ?What immunizations do I need? ?Vaccines are usually given at various ages, according to a schedule. Your health care provider will recommend vaccines for you based on your age, medical history, and lifestyle or other factors, such as travel or where you work. ?What tests do I need? ?Screening ?Your health care provider may recommend screening tests for certain conditions. This may include: ?Lipid and cholesterol levels. ?Hepatitis C test. ?Hepatitis B test. ?HIV (human immunodeficiency virus) test. ?STI (sexually transmitted infection) testing, if you are at risk. ?Lung cancer screening. ?Colorectal cancer screening. ?Diabetes screening. This is done by checking your blood sugar (glucose) after you have not eaten for a while (fasting). ?Mammogram. Talk with your health care provider about how often you should have regular mammograms. ?BRCA-related cancer screening. This may be done if you have a family history of breast, ovarian, tubal, or peritoneal cancers. ?Bone density scan. This is done to screen for osteoporosis. ?Talk with your health care provider about your test results, treatment options, and if necessary, the need for more tests. ?Follow these instructions at home: ?Eating and drinking ? ?Eat a diet that includes fresh fruits and vegetables, whole grains, lean protein, and low-fat dairy products. Limit your intake of foods with high amounts of sugar, saturated fats, and salt. ?Take vitamin and mineral supplements as recommended by your health care provider. ?  Do not drink  alcohol if your health care provider tells you not to drink. ?If you drink alcohol: ?Limit how much you have to 0-1 drink a day. ?Know how much alcohol is in your drink. In the [place] one drink equals one 12 oz bottle of beer (355 mL), one 5 oz glass of wine (148 mL), or one 1? oz glass of hard liquor (44 mL). ?Lifestyle ?Brush your teeth every morning and night with fluoride toothpaste. Floss one time each day. ?Exercise for at least 30 minutes 5 or more days each week. ?Do not use any products that contain nicotine or tobacco. These products include cigarettes, chewing tobacco, and vaping devices, such as e-cigarettes. If you need help quitting, ask your health care provider. ?Do not use drugs. ?If you are sexually active, practice safe sex. Use a condom or other form of protection in order to prevent STIs. ?Take aspirin only as told by your health care provider. Make sure that you understand how much to take and what form to take. Work with your health care provider to find out whether it is safe and beneficial for you to take aspirin daily. ?Ask your health care provider if you need to take a cholesterol-lowering medicine (statin). ?Find healthy ways to manage stress, such as: ?Meditation, yoga, or listening to music. ?Journaling. ?Talking to a trusted person. ?Spending time with friends and family. ?Minimize exposure to UV radiation to reduce your risk of skin cancer. ?Safety ?Always wear your seat belt while driving or riding in a vehicle. ?Do not drive: ?If you have been drinking alcohol. Do not ride with someone who has been drinking. ?When you are tired or distracted. ?While texting. ?If you have been using any mind-altering substances or drugs. ?Wear a helmet and other protective equipment during sports activities. ?If you have firearms in your house, make sure you follow all gun safety procedures. ?What's next? ?Visit your health care provider once a year for an annual wellness visit. ?Ask your health care  provider how often you should have your eyes and teeth checked. ?Stay up to date on all vaccines. ?This information is not intended to replace advice given to you by your health care provider. Make sure you discuss any questions you have with your health care provider. ?Document Revised: 09/06/2020 Document Reviewed: 09/06/2020 ?Elsevier Patient Education ? 2022 [place]. ? ? ? ? ? ? ?

## 2021-06-18 NOTE — Assessment & Plan Note (Signed)
Initially elevated, re-checked and improved. ?Continue metoprolol succinate 50 mg daily and low-salt diet. ?Monitor BP at home. ? ?

## 2021-06-18 NOTE — Assessment & Plan Note (Signed)
Goal < 70. ?Continue Crestor 40 mg daily and Zetia 10 mg daily as well as low-fat diet. ?She has an appointment with her cardiologist tomorrow. ?Further recommendations according to lipid panel result. ?

## 2021-06-18 NOTE — Assessment & Plan Note (Signed)
Continue B12 1000 mcg daily. ?Further recommendation will be given according to B12 result. ?

## 2021-06-18 NOTE — Progress Notes (Signed)
Remote pacemaker transmission.   

## 2021-06-18 NOTE — Assessment & Plan Note (Signed)
HgA1C has been at goal. ?Continue nonpharmacologic treatment. ?Annual eye exam and foot care to continue. ?F/U in 5-6 months ? ?

## 2021-06-18 NOTE — Assessment & Plan Note (Signed)
Asymptomatic. ?Continue metoprolol succinate and furosemide as well as low-salt. ?Follow-up with cardiologist, next appointment tomorrow. ?

## 2021-06-19 ENCOUNTER — Ambulatory Visit: Payer: Medicare HMO | Admitting: Internal Medicine

## 2021-06-19 ENCOUNTER — Other Ambulatory Visit: Payer: Self-pay

## 2021-06-19 ENCOUNTER — Encounter: Payer: Self-pay | Admitting: Internal Medicine

## 2021-06-19 VITALS — BP 128/50 | HR 63 | Ht 63.0 in | Wt 172.2 lb

## 2021-06-19 DIAGNOSIS — I10 Essential (primary) hypertension: Secondary | ICD-10-CM | POA: Diagnosis not present

## 2021-06-19 DIAGNOSIS — I442 Atrioventricular block, complete: Secondary | ICD-10-CM

## 2021-06-19 DIAGNOSIS — I2510 Atherosclerotic heart disease of native coronary artery without angina pectoris: Secondary | ICD-10-CM

## 2021-06-19 DIAGNOSIS — E782 Mixed hyperlipidemia: Secondary | ICD-10-CM

## 2021-06-19 DIAGNOSIS — Z950 Presence of cardiac pacemaker: Secondary | ICD-10-CM | POA: Diagnosis not present

## 2021-06-19 LAB — VITAMIN B12: Vitamin B-12: 776 pg/mL (ref 211–911)

## 2021-06-19 NOTE — Patient Instructions (Signed)
Medication Instructions:  ?Your physician recommends that you continue on your current medications as directed. Please refer to the Current Medication list given to you today. ? ?Labwork: ?None ordered. ? ?Testing/Procedures: ?None ordered. ? ?Follow-Up: ?Your physician wants you to follow-up in: one year with Cristopher Peru, MD or one of the following Advanced Practice Providers on your designated Care Team:   ?Tommye Standard, PA-C ?[name] "Jonni Sanger" Nelson, PA-C ? ?Remote monitoring is used to monitor your Pacemaker from home. This monitoring reduces the number of office visits required to check your device to one time per year. It allows [place] to keep an eye on the functioning of your device to ensure it is working properly. You are scheduled for a device check from home on 09/05/2021. You may send your transmission at any time that day. If you have a wireless device, the transmission will be sent automatically. After your physician reviews your transmission, you will receive a postcard with your next transmission date. ? ?Any Other Special Instructions Will Be Listed Below (If Applicable). ? ?If you need a refill on your cardiac medications before your next appointment, please call your pharmacy.  ? ? ? ? ?

## 2021-06-19 NOTE — Progress Notes (Signed)
? ? ? ? ?HPI ?April Lowery is referred today for ongoing PM followup. She has CAD, s/p MI, with LV dysfunction. She has moved from [place] to [place] to "get out of [place]."  She lives with her son and daughter in Sports coach in [place]. She denies chest pain or sob. No edema. No syncope. She has been in [place] with her son for 7 months. She plans to go back.  ?Allergies  ?Allergen Reactions  ? Penicillins Rash  ? ? ? ?Current Outpatient Medications  ?Medication Sig Dispense Refill  ? acetaminophen (TYLENOL) 650 MG CR tablet Take 650 mg by mouth every 8 (eight) hours as needed for pain.    ? aspirin EC 81 MG tablet Take 81 mg by mouth daily. Swallow whole.    ? cholecalciferol (VITAMIN D3) 25 MCG (1000 UT) tablet Take 1,000 Units by mouth daily.    ? clopidogrel (PLAVIX) 75 MG tablet Take 1 tablet (75 mg total) by mouth daily. 90 tablet 1  ? Cyanocobalamin (B-12) 2500 MCG TABS Take 1 tablet by mouth daily.    ? ezetimibe (ZETIA) 10 MG tablet Take 1 tablet (10 mg total) by mouth daily. 90 tablet 1  ? furosemide (LASIX) 20 MG tablet TAKE 2 TABLETS BY MOUTH DAILY FOR 5 DAYS THEN 1 TABLET DAILY 30 tablet 1  ? metoprolol succinate (TOPROL-XL) 50 MG 24 hr tablet TAKE 1 TABLET BY MOUTH DAILY. TAKE WITH OR IMMEDIATELY FOLLOWING A MEAL. 90 tablet 1  ? pantoprazole (PROTONIX) 40 MG tablet Take 1 tablet (40 mg total) by mouth daily. 90 tablet 3  ? rosuvastatin (CRESTOR) 40 MG tablet TAKE 1 TABLET BY MOUTH EVERYDAY AT BEDTIME 90 tablet 3  ? sertraline (ZOLOFT) 50 MG tablet TAKE 1 TABLET BY MOUTH EVERY DAY 90 tablet 1  ? ?No current facility-administered medications for this visit.  ? ? ? ?Past Medical History:  ?Diagnosis Date  ? Acute ST segment elevation myocardial infarction [facility])   ? Arthritis   ? CAD (coronary artery disease) 02/15/2019  ? Cancer of left kidney ([place])   ? Complete atrioventricular block (HCC)   ? DVT (deep venous thrombosis) ([place]) 02/15/2019  ? GI bleeding   ? History of cerebellar stroke 02/15/2019  ? HLD  (hyperlipidemia) 02/15/2019  ? HTN (hypertension) 02/15/2019  ? Ischemic cardiomyopathy   ? Melena   ? Pacemaker   ? Pulmonary thromboembolism ([place]) 02/15/2019  ? ? ?ROS: ? ? All systems reviewed and negative except as noted in the HPI. ? ? ?Past Surgical History:  ?Procedure Laterality Date  ? CHOLECYSTECTOMY    ? CORONARY ARTERY BYPASS GRAFT    ? STENTS  ? PACEMAKER INSERTION    ? TONSILLECTOMY AND ADENOIDECTOMY  2017  ? ? ? ?Family History  ?Problem Relation Age of Onset  ? Heart disease Mother   ? Heart attack Mother   ? Heart disease Father   ? Heart attack Father   ? Heart disease Brother   ? Heart attack Brother   ? Heart attack Maternal Grandmother   ? Heart attack Maternal Grandfather   ? Heart attack Paternal Grandmother   ? Heart attack Paternal Grandfather   ? ? ? ?Social History  ? ?Socioeconomic History  ? Marital status: Widowed  ?  Spouse name: Not on file  ? Number of children: 4  ? Years of education: Not on file  ? Highest education level: Not on file  ?Occupational History  ? Occupation: retired  ?Tobacco Use  ?  Smoking status: Former  ? Smokeless tobacco: Never  ?Vaping Use  ? Vaping Use: Never used  ?Substance and Sexual Activity  ? Alcohol use: Never  ? Drug use: Never  ? Sexual activity: Not on file  ?  Comment: MARRIED  ?Other Topics Concern  ? Not on file  ?Social History Narrative  ? Not on file  ? ?Social Determinants of Health  ? ?Financial Resource Strain: Low Risk   ? Difficulty of Paying Living Expenses: Not hard at all  ?Food Insecurity: No Food Insecurity  ? Worried About Charity fundraiser in the Last Year: Never true  ? Ran Out of Food in the Last Year: Never true  ?Transportation Needs: No Transportation Needs  ? Lack of Transportation (Medical): No  ? Lack of Transportation (Non-Medical): No  ?Physical Activity: Inactive  ? Days of Exercise per Week: 0 days  ? Minutes of Exercise per Session: 0 min  ?Stress: No Stress Concern Present  ? Feeling of Stress : Not at all  ?Social  Connections: Moderately Isolated  ? Frequency of Communication with Friends and Family: More than three times a week  ? Frequency of Social Gatherings with Friends and Family: More than three times a week  ? Attends Religious Services: Never  ? Active Member of Clubs or Organizations: Yes  ? Attends Archivist Meetings: 1 to 4 times per year  ? Marital Status: Widowed  ?Intimate Partner Violence: Not At Risk  ? Fear of Current or Ex-Partner: No  ? Emotionally Abused: No  ? Physically Abused: No  ? Sexually Abused: No  ? ? ? ?BP (!) 128/50   Pulse 63   Ht '5\' 3"'$  (1.6 m)   Wt 172 lb 3.2 oz (78.1 kg)   SpO2 96%   BMI 30.50 kg/m?  ? ?Physical Exam: ? ?Well appearing NAD ?HEENT: Unremarkable ?Neck:  No JVD, no thyromegally ?Lymphatics:  No adenopathy ?Back:  No CVA tenderness ?Lungs:  Clear ?HEART:  Regular rate rhythm, no murmurs, no rubs, no clicks ?Abd:  soft, positive bowel sounds, no organomegally, no rebound, no guarding ?Ext:  2 plus pulses, no edema, no cyanosis, no clubbing ?Skin:  No rashes no nodules ?Neuro:  CN II through XII intact, motor grossly intact ? ?EKG ? ?DEVICE  ?Normal device function.  See PaceArt for details.  ? ?Assess/Plan:  ?1. Sinus node dysfunction/CHB - she is conducting normally and pacing minimally. She is asymptomatic. ?2. 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 We will recheck in several months. 11 years on the battery. ?3. CAD - she is s/p MI. She has moderate LV dysfunction. She has no CHF symptoms. She is encouraged to remain active. She will call if she has more symptoms. ?4. HTN - Her SBP is reasonably well controlled. We will follow. I discussed the importance of a low sodium diet.  ?  ?Enolia Koepke,M.D. ?

## 2021-06-20 ENCOUNTER — Other Ambulatory Visit: Payer: Self-pay

## 2021-06-20 MED ORDER — EMPAGLIFLOZIN 10 MG PO TABS: 10.0000 mg | ORAL_TABLET | Freq: Every day | ORAL | 3 refills | Status: DC

## 2021-06-24 ENCOUNTER — Other Ambulatory Visit: Payer: Self-pay | Admitting: Adult Health

## 2021-06-24 ENCOUNTER — Other Ambulatory Visit: Payer: Self-pay | Admitting: Internal Medicine

## 2021-06-25 ENCOUNTER — Encounter: Payer: Self-pay | Admitting: Family Medicine

## 2021-07-27 NOTE — Progress Notes (Signed)
? ?ACUTE VISIT ?Chief Complaint  ?Patient presents with  ? Breathing Problem  ?  Shortness of breath is getting worse.   ? ?HPI: ?[name] is a 76 y.o. female with history of DM 2, CVA, CAD, anxiety, and complete heart block status post pacemaker placement here today complaining of worsening SOB for the past 2 weeks. ?She has had some SOB intermittently for a while. ?SOB exacerbated by doing "anything" for less than 10 min. ?Alleviated by rest. ? ?Shortness of Breath ?This is a chronic problem. The current episode started 1 to 4 weeks ago. The problem occurs intermittently. The problem has been gradually worsening. Pertinent negatives include no abdominal pain, chest pain, claudication, fever, headaches, hemoptysis, leg swelling, orthopnea, PND, rash, rhinorrhea, sore throat, sputum production, swollen glands, syncope, vomiting or wheezing. The symptoms are aggravated by exercise. She has tried nothing for the symptoms. Her past medical history is significant for CAD and a heart failure. There is no history of a recent surgery.  ? ?She is not having calf pain, edema,or erythema. ?Travelled back from [place] in 05/2021. ?No recent surgeries. ?Hx of PE and DVT. ?CAD and CVA: She takes Plavix 75 mg daily and Aspirin 81 mg daily. ? ?Lab Results  ?Component Value Date  ? WBC 8.9 12/10/2019  ? HGB 12.9 12/10/2019  ? HCT 38.5 12/10/2019  ? MCV 87.6 12/10/2019  ? PLT 214.0 12/10/2019  ? ?Lab Results  ?Component Value Date  ? CREATININE 0.98 06/18/2021  ? BUN 18 06/18/2021  ? NA 138 06/18/2021  ? K 4.3 06/18/2021  ? CL 101 06/18/2021  ? CO2 32 06/18/2021  ? ?C/O left lower back pain, left hip and knee pain. ?Occasional tingling. ?Sometimes pain interferes with sleep. ?Exacerbated by prolonged walking and standing. ?+ Stiffness. ?No hx of trauma. ?Sometimes left knee edema, no erythema. ?She takes Tylenol. ? ?Lumbar and hip X ray done on 07/12/20. ?Multilevel degenerative changes of the spine and moderate left hip  OA. ? ?She is also concerned about elevated BS's  ?Fasting BS's 130-170's in the past few days. ?She is on Jardiance 10 mg daily. ?She has not changed dietary habits or physical activity. ?Negative for polydipsia,polyuria, or polyphagia. ? ?Lab Results  ?Component Value Date  ? HGBA1C 7.2 (H) 06/18/2021  ? ?Review of Systems  ?Constitutional:  Positive for activity change and fatigue. Negative for appetite change and fever.  ?HENT:  Negative for rhinorrhea and sore throat.   ?Respiratory:  Positive for shortness of breath. Negative for cough, hemoptysis, sputum production, chest tightness and wheezing.   ?Cardiovascular:  Negative for chest pain, orthopnea, claudication, leg swelling, syncope and PND.  ?Gastrointestinal:  Negative for abdominal pain, nausea and vomiting.  ?Genitourinary:  Negative for decreased urine volume, dysuria and hematuria.  ?Musculoskeletal:  Positive for arthralgias, back pain and gait problem.  ?Skin:  Negative for rash.  ?Neurological:  Negative for syncope and headaches.  ?Rest see pertinent positives and negatives per HPI. ? ?Current Outpatient Medications on File Prior to Visit  ?Medication Sig Dispense Refill  ? acetaminophen (TYLENOL) 650 MG CR tablet Take 650 mg by mouth every 8 (eight) hours as needed for pain.    ? aspirin EC 81 MG tablet Take 81 mg by mouth daily. Swallow whole.    ? cholecalciferol (VITAMIN D3) 25 MCG (1000 UT) tablet Take 1,000 Units by mouth daily.    ? Cyanocobalamin (B-12) 2500 MCG TABS Take 1 tablet by mouth daily.    ? empagliflozin (  JARDIANCE) 10 MG TABS tablet Take 1 tablet (10 mg total) by mouth daily before breakfast. 30 tablet 3  ? ezetimibe (ZETIA) 10 MG tablet TAKE 1 TABLET BY MOUTH EVERY DAY 90 tablet 3  ? furosemide (LASIX) 20 MG tablet TAKE 2 TABLETS BY MOUTH DAILY FOR 5 DAYS THEN 1 TABLET DAILY 30 tablet 1  ? metoprolol succinate (TOPROL-XL) 50 MG 24 hr tablet TAKE 1 TABLET BY MOUTH DAILY. TAKE WITH OR IMMEDIATELY FOLLOWING A MEAL. 90 tablet 1   ? pantoprazole (PROTONIX) 40 MG tablet Take 1 tablet (40 mg total) by mouth daily. 90 tablet 3  ? rosuvastatin (CRESTOR) 40 MG tablet TAKE 1 TABLET BY MOUTH EVERYDAY AT BEDTIME 90 tablet 3  ? sertraline (ZOLOFT) 50 MG tablet TAKE 1 TABLET BY MOUTH EVERY DAY 90 tablet 1  ? ?No current facility-administered medications on file prior to visit.  ? ?Past Medical History:  ?Diagnosis Date  ? Acute ST segment elevation myocardial infarction [facility])   ? Arthritis   ? CAD (coronary artery disease) 02/15/2019  ? Cancer of left kidney ([place])   ? Complete atrioventricular block (HCC)   ? DVT (deep venous thrombosis) ([place]) 02/15/2019  ? GI bleeding   ? History of cerebellar stroke 02/15/2019  ? HLD (hyperlipidemia) 02/15/2019  ? HTN (hypertension) 02/15/2019  ? Ischemic cardiomyopathy   ? Melena   ? Pacemaker   ? Pulmonary thromboembolism ([place]) 02/15/2019  ? ?Allergies  ?Allergen Reactions  ? Penicillins Rash  ? ?Social History  ? ?Socioeconomic History  ? Marital status: Widowed  ?  Spouse name: Not on file  ? Number of children: 4  ? Years of education: Not on file  ? Highest education level: 12th grade  ?Occupational History  ? Occupation: retired  ?Tobacco Use  ? Smoking status: Former  ? Smokeless tobacco: Never  ?Vaping Use  ? Vaping Use: Never used  ?Substance and Sexual Activity  ? Alcohol use: Never  ? Drug use: Never  ? Sexual activity: Not on file  ?  Comment: MARRIED  ?Other Topics Concern  ? Not on file  ?Social History Narrative  ? Not on file  ? ?Social Determinants of Health  ? ?Financial Resource Strain: Low Risk   ? Difficulty of Paying Living Expenses: Not hard at all  ?Food Insecurity: No Food Insecurity  ? Worried About Charity fundraiser in the Last Year: Never true  ? Ran Out of Food in the Last Year: Never true  ?Transportation Needs: No Transportation Needs  ? Lack of Transportation (Medical): No  ? Lack of Transportation (Non-Medical): No  ?Physical Activity: Insufficiently Active  ? Days of Exercise per  Week: 3 days  ? Minutes of Exercise per Session: 20 min  ?Stress: No Stress Concern Present  ? Feeling of Stress : Only a little  ?Social Connections: Moderately Isolated  ? Frequency of Communication with Friends and Family: Twice a week  ? Frequency of Social Gatherings with Friends and Family: More than three times a week  ? Attends Religious Services: Never  ? Active Member of Clubs or Organizations: No  ? Attends Archivist Meetings: 1 to 4 times per year  ? Marital Status: Widowed  ? ?Vitals:  ? 07/30/21 0815  ?BP: 124/70  ?Pulse: 75  ?Resp: 16  ?Temp: 98.2 ?F (36.8 ?C)  ?SpO2: 93%  ? ?Body mass index is 30.38 kg/m?. ? ?Physical Exam ?Vitals and nursing note reviewed.  ?Constitutional:   ?  General: She is not in acute distress. ?   Appearance: She is well-developed.  ?HENT:  ?   Head: Normocephalic and atraumatic.  ?   Mouth/Throat:  ?   Mouth: Mucous membranes are moist.  ?   Pharynx: Oropharynx is clear.  ?Eyes:  ?   Conjunctiva/sclera: Conjunctivae normal.  ?Cardiovascular:  ?   Rate and Rhythm: Normal rate and regular rhythm.  ?   Pulses:     ?     Dorsalis pedis pulses are 2+ on the right side and 2+ on the left side.  ?   Heart sounds: No murmur heard. ?   Comments: DP and PT pulses present, bilateral. ?Pulmonary:  ?   Effort: Pulmonary effort is normal. No respiratory distress.  ?   Breath sounds: Normal breath sounds.  ?Abdominal:  ?   Palpations: Abdomen is soft. There is no hepatomegaly or mass.  ?   Tenderness: There is no abdominal tenderness.  ?Lymphadenopathy:  ?   Cervical: No cervical adenopathy.  ?Skin: ?   General: Skin is warm.  ?   Findings: No erythema or rash.  ?Neurological:  ?   General: No focal deficit present.  ?   Mental Status: She is alert and oriented to person, place, and time.  ?   Cranial Nerves: No cranial nerve deficit.  ?   Comments: Antalgic gait, not assisted.  ?Psychiatric:  ?   Comments: Well groomed, good eye contact.  ? ?ASSESSMENT AND PLAN: ? ?Ms.Tatiana  was seen today for breathing problem. ? ?Diagnoses and all orders for this visit: ?Orders Placed This Encounter  ?Procedures  ? DG Chest 2 View  ? CBC  ? Basic metabolic panel  ? TSH  ? Potassium  ? D-dimer

## 2021-07-30 ENCOUNTER — Ambulatory Visit (INDEPENDENT_AMBULATORY_CARE_PROVIDER_SITE_OTHER): Payer: Medicare HMO

## 2021-07-30 ENCOUNTER — Encounter: Payer: Self-pay | Admitting: Family Medicine

## 2021-07-30 ENCOUNTER — Other Ambulatory Visit: Payer: Self-pay | Admitting: *Deleted

## 2021-07-30 ENCOUNTER — Ambulatory Visit (INDEPENDENT_AMBULATORY_CARE_PROVIDER_SITE_OTHER): Payer: Medicare HMO | Admitting: Family Medicine

## 2021-07-30 VITALS — BP 124/70 | HR 75 | Temp 98.2°F | Resp 16 | Ht 63.0 in | Wt 171.5 lb

## 2021-07-30 DIAGNOSIS — E876 Hypokalemia: Secondary | ICD-10-CM

## 2021-07-30 DIAGNOSIS — E1169 Type 2 diabetes mellitus with other specified complication: Secondary | ICD-10-CM

## 2021-07-30 DIAGNOSIS — M25552 Pain in left hip: Secondary | ICD-10-CM | POA: Diagnosis not present

## 2021-07-30 DIAGNOSIS — R0609 Other forms of dyspnea: Secondary | ICD-10-CM

## 2021-07-30 DIAGNOSIS — M545 Low back pain, unspecified: Secondary | ICD-10-CM

## 2021-07-30 DIAGNOSIS — G8929 Other chronic pain: Secondary | ICD-10-CM

## 2021-07-30 DIAGNOSIS — R0602 Shortness of breath: Secondary | ICD-10-CM | POA: Diagnosis not present

## 2021-07-30 LAB — CBC
HCT: 40.1 % (ref 36.0–46.0)
Hemoglobin: 13.3 g/dL (ref 12.0–15.0)
MCHC: 33.2 g/dL (ref 30.0–36.0)
MCV: 87.6 fl (ref 78.0–100.0)
Platelets: 246 10*3/uL (ref 150.0–400.0)
RBC: 4.58 Mil/uL (ref 3.87–5.11)
RDW: 14.6 % (ref 11.5–15.5)
WBC: 7.7 10*3/uL (ref 4.0–10.5)

## 2021-07-30 LAB — BASIC METABOLIC PANEL
BUN: 17 mg/dL (ref 6–23)
CO2: 27 mEq/L (ref 19–32)
Calcium: 9.6 mg/dL (ref 8.4–10.5)
Chloride: 104 mEq/L (ref 96–112)
Creatinine, Ser: 1.15 mg/dL (ref 0.40–1.20)
GFR: 47.1 mL/min — ABNORMAL LOW (ref >60.00)
Glucose, Bld: 132 mg/dL — ABNORMAL HIGH (ref 70–99)
Potassium: 3 mEq/L — ABNORMAL LOW (ref 3.5–5.1)
Sodium: 136 mEq/L (ref 135–145)

## 2021-07-30 LAB — TSH: TSH: 3.21 u[IU]/mL (ref 0.35–5.50)

## 2021-07-30 IMAGING — DX DG CHEST 2V
2 series · 2 of 2 positions shown · non-contrast
Comparison: Chest radiograph [DATE].

CLINICAL DATA: Worsening shortness of breath.

EXAM:
CHEST - 2 VIEW

[chest pa]
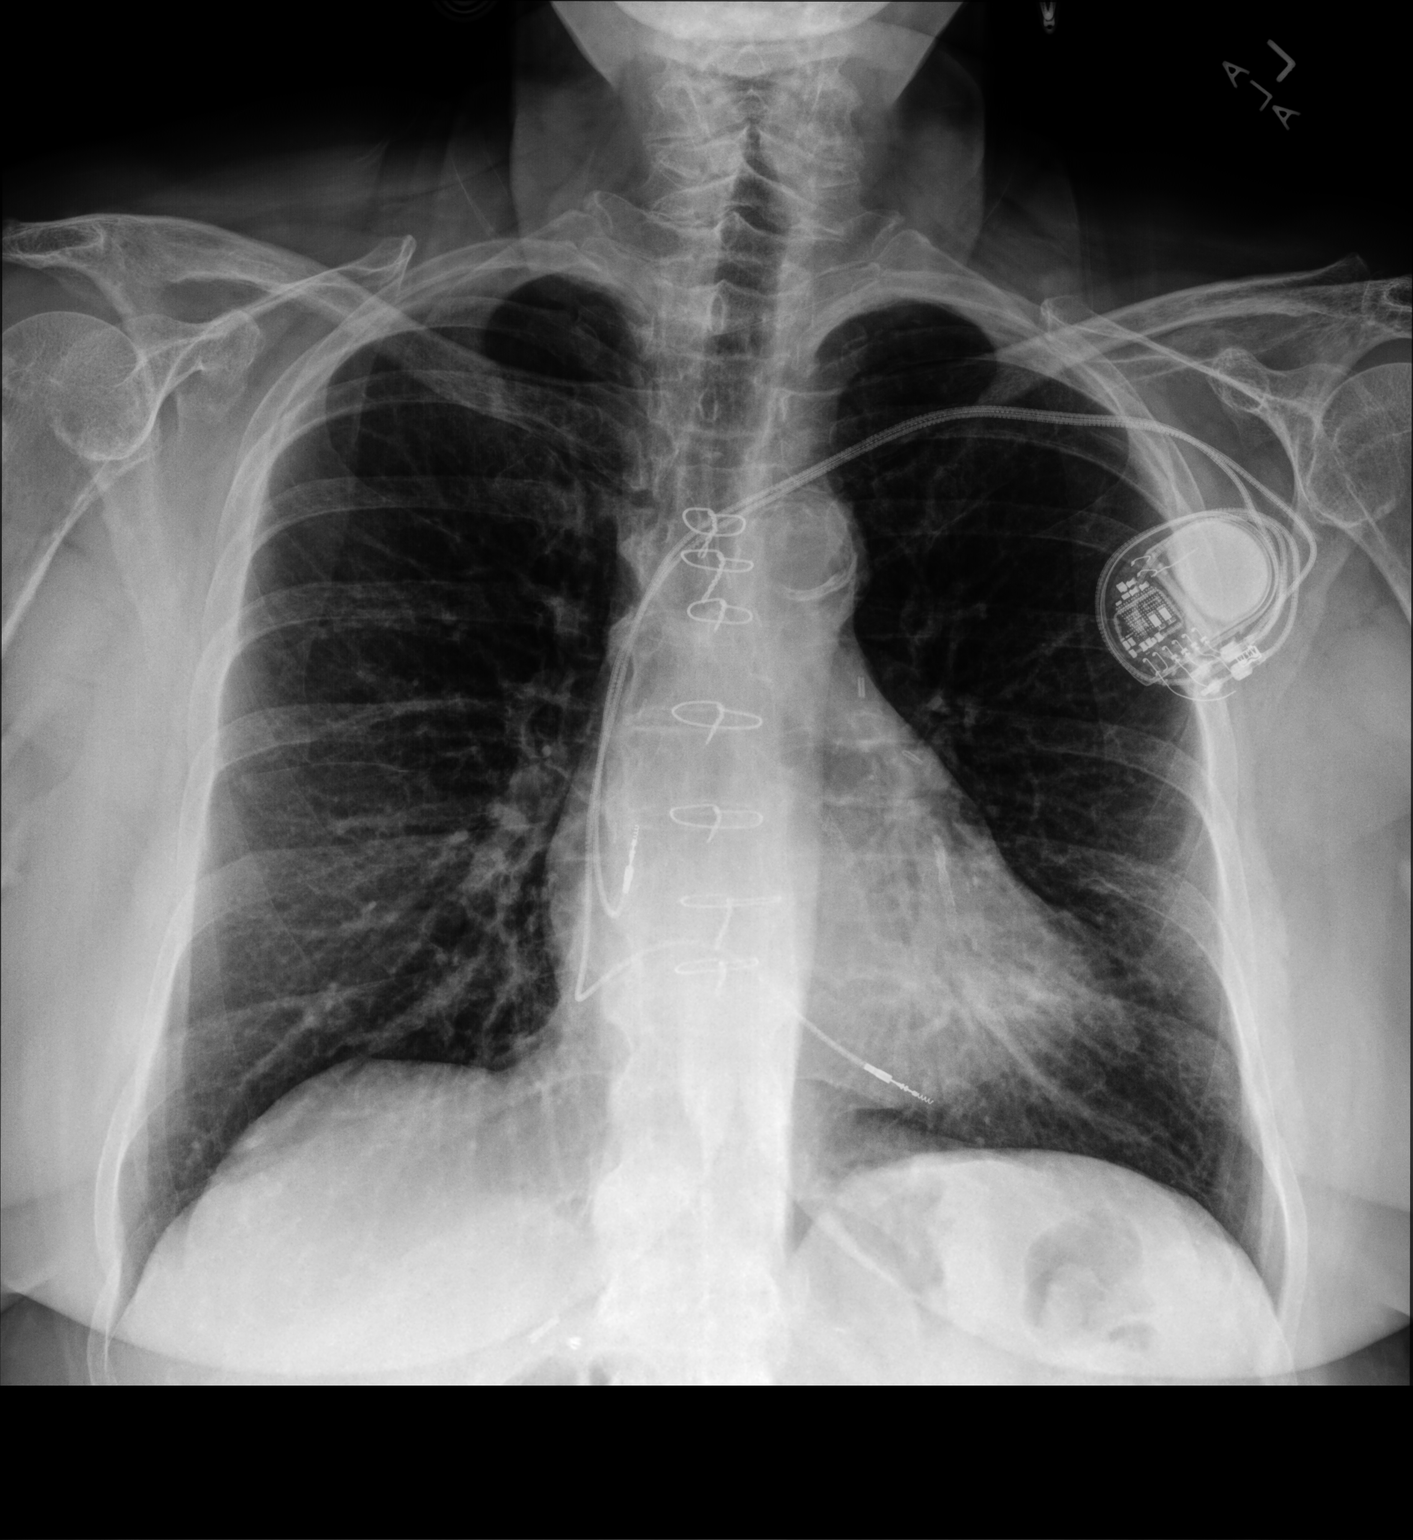

[chest lat]
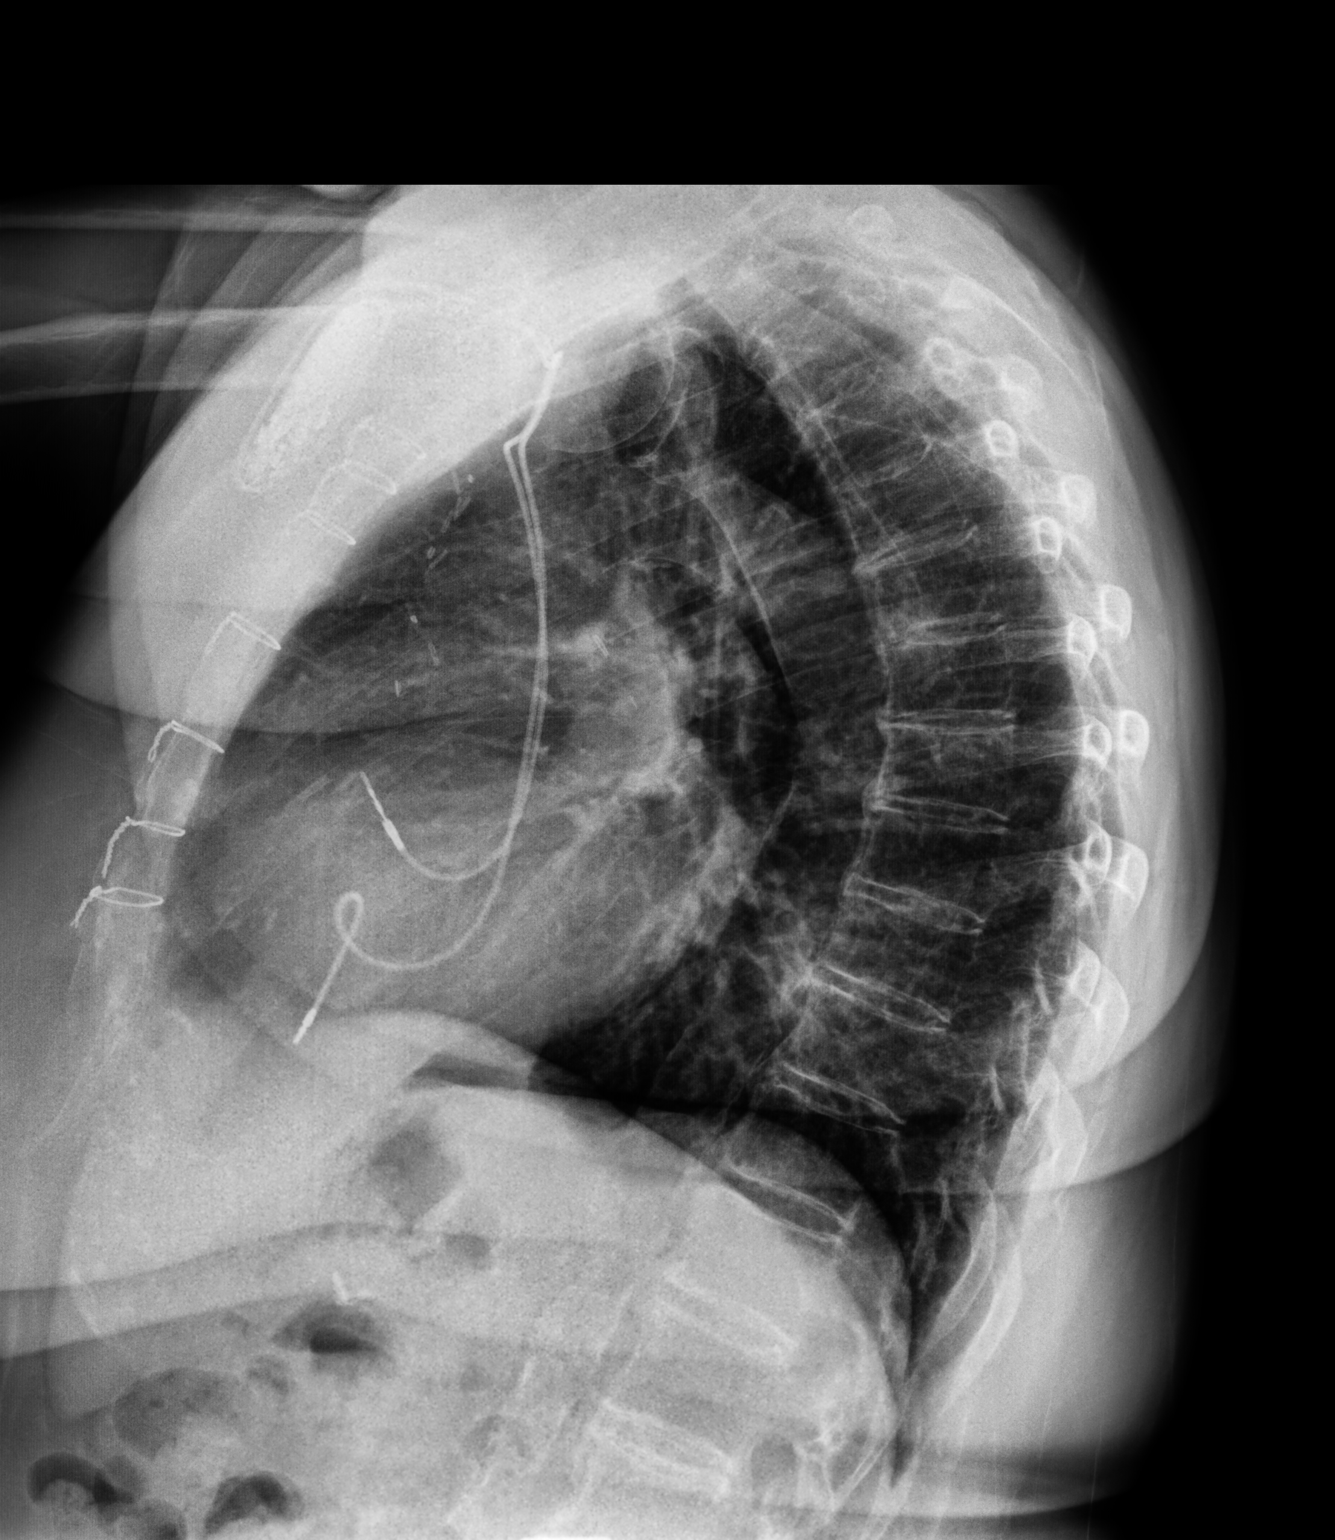

[2 of 2 positions shown; findings below may reference images not displayed]

FINDINGS: Dual lead pacer apparatus unchanged. Stable cardiac and mediastinal
contours. No consolidative pulmonary opacities. No pleural effusion
or pneumothorax. Thoracic spine degenerative changes.
IMPRESSION: No active cardiopulmonary disease.

## 2021-07-30 MED ORDER — POTASSIUM CHLORIDE CRYS ER 20 MEQ PO TBCR: 20.0000 meq | EXTENDED_RELEASE_TABLET | Freq: Every day | ORAL | 0 refills | Status: DC

## 2021-07-30 MED ORDER — CLOPIDOGREL BISULFATE 75 MG PO TABS: 75.0000 mg | ORAL_TABLET | Freq: Every day | ORAL | 3 refills | Status: DC

## 2021-07-30 NOTE — Patient Instructions (Addendum)
A few things to remember from today's visit: ? ? ?DOE (dyspnea on exertion) - Plan: CBC, Basic metabolic panel, DG Chest 2 View, TSH ? ?Chronic left-sided low back pain, unspecified whether sciatica present ? ?Left hip pain ? ?Type 2 diabetes mellitus with other specified complication, without long-term current use of insulin ([place]) ? ?If you need refills please call your pharmacy. ?Do not use My Chart to request refills or for acute issues that need immediate attention. ?  ?Please contact you cardiologist's office before leaving town. ?Monitor for new symptoms. ?Continue monitoring blood sugar., we could increase Jardiance if continues being elevated. ? ?Left hip and knee pain as well as back are most likely osteoarthritis, sciatic is in the differential. ?Tylenol 500 mg 3-4 times per day., ? ?Please be sure medication list is accurate. ?If a new problem present, please set up appointment sooner than planned today. ? ? ? ? ? ? ? ?

## 2021-07-31 ENCOUNTER — Other Ambulatory Visit: Payer: Medicare HMO

## 2021-07-31 DIAGNOSIS — I25709 Atherosclerosis of coronary artery bypass graft(s), unspecified, with unspecified angina pectoris: Secondary | ICD-10-CM | POA: Diagnosis not present

## 2021-07-31 DIAGNOSIS — R0609 Other forms of dyspnea: Secondary | ICD-10-CM | POA: Diagnosis not present

## 2021-07-31 DIAGNOSIS — I25119 Atherosclerotic heart disease of native coronary artery with unspecified angina pectoris: Secondary | ICD-10-CM | POA: Diagnosis not present

## 2021-07-31 DIAGNOSIS — I700 Atherosclerosis of aorta: Secondary | ICD-10-CM | POA: Diagnosis not present

## 2021-07-31 DIAGNOSIS — I2582 Chronic total occlusion of coronary artery: Secondary | ICD-10-CM | POA: Diagnosis not present

## 2021-07-31 DIAGNOSIS — I2510 Atherosclerotic heart disease of native coronary artery without angina pectoris: Secondary | ICD-10-CM | POA: Diagnosis not present

## 2021-07-31 DIAGNOSIS — R29898 Other symptoms and signs involving the musculoskeletal system: Secondary | ICD-10-CM | POA: Diagnosis not present

## 2021-07-31 DIAGNOSIS — M79662 Pain in left lower leg: Secondary | ICD-10-CM | POA: Diagnosis not present

## 2021-07-31 DIAGNOSIS — I255 Ischemic cardiomyopathy: Secondary | ICD-10-CM | POA: Diagnosis not present

## 2021-07-31 DIAGNOSIS — Z950 Presence of cardiac pacemaker: Secondary | ICD-10-CM | POA: Diagnosis not present

## 2021-07-31 DIAGNOSIS — I442 Atrioventricular block, complete: Secondary | ICD-10-CM | POA: Diagnosis not present

## 2021-07-31 DIAGNOSIS — T82855A Stenosis of coronary artery stent, initial encounter: Secondary | ICD-10-CM | POA: Diagnosis not present

## 2021-07-31 DIAGNOSIS — R7989 Other specified abnormal findings of blood chemistry: Secondary | ICD-10-CM | POA: Diagnosis not present

## 2021-07-31 DIAGNOSIS — I25118 Atherosclerotic heart disease of native coronary artery with other forms of angina pectoris: Secondary | ICD-10-CM | POA: Diagnosis not present

## 2021-07-31 DIAGNOSIS — Z955 Presence of coronary angioplasty implant and graft: Secondary | ICD-10-CM | POA: Diagnosis not present

## 2021-07-31 DIAGNOSIS — R918 Other nonspecific abnormal finding of lung field: Secondary | ICD-10-CM | POA: Diagnosis not present

## 2021-07-31 LAB — D-DIMER, QUANTITATIVE: D-Dimer, Quant: 0.94 mcg/mL FEU — ABNORMAL HIGH (ref <0.50)

## 2021-08-01 DIAGNOSIS — I340 Nonrheumatic mitral (valve) insufficiency: Secondary | ICD-10-CM | POA: Diagnosis not present

## 2021-08-01 DIAGNOSIS — I2510 Atherosclerotic heart disease of native coronary artery without angina pectoris: Secondary | ICD-10-CM | POA: Diagnosis not present

## 2021-08-01 DIAGNOSIS — I25810 Atherosclerosis of coronary artery bypass graft(s) without angina pectoris: Secondary | ICD-10-CM | POA: Diagnosis not present

## 2021-08-01 DIAGNOSIS — I440 Atrioventricular block, first degree: Secondary | ICD-10-CM | POA: Diagnosis not present

## 2021-08-01 DIAGNOSIS — I110 Hypertensive heart disease with heart failure: Secondary | ICD-10-CM | POA: Diagnosis not present

## 2021-08-01 DIAGNOSIS — I255 Ischemic cardiomyopathy: Secondary | ICD-10-CM | POA: Diagnosis not present

## 2021-08-01 DIAGNOSIS — I420 Dilated cardiomyopathy: Secondary | ICD-10-CM | POA: Diagnosis not present

## 2021-08-01 DIAGNOSIS — R0609 Other forms of dyspnea: Secondary | ICD-10-CM | POA: Diagnosis not present

## 2021-08-01 DIAGNOSIS — R079 Chest pain, unspecified: Secondary | ICD-10-CM | POA: Diagnosis not present

## 2021-08-01 DIAGNOSIS — I25119 Atherosclerotic heart disease of native coronary artery with unspecified angina pectoris: Secondary | ICD-10-CM | POA: Diagnosis not present

## 2021-08-01 DIAGNOSIS — Z951 Presence of aortocoronary bypass graft: Secondary | ICD-10-CM | POA: Diagnosis not present

## 2021-08-01 DIAGNOSIS — I252 Old myocardial infarction: Secondary | ICD-10-CM | POA: Diagnosis not present

## 2021-08-01 DIAGNOSIS — Z45018 Encounter for adjustment and management of other part of cardiac pacemaker: Secondary | ICD-10-CM | POA: Diagnosis not present

## 2021-08-01 DIAGNOSIS — Z955 Presence of coronary angioplasty implant and graft: Secondary | ICD-10-CM | POA: Diagnosis not present

## 2021-08-01 DIAGNOSIS — E785 Hyperlipidemia, unspecified: Secondary | ICD-10-CM | POA: Diagnosis not present

## 2021-08-01 DIAGNOSIS — I5023 Acute on chronic systolic (congestive) heart failure: Secondary | ICD-10-CM | POA: Diagnosis not present

## 2021-08-01 DIAGNOSIS — R0600 Dyspnea, unspecified: Secondary | ICD-10-CM | POA: Diagnosis not present

## 2021-08-01 DIAGNOSIS — I25118 Atherosclerotic heart disease of native coronary artery with other forms of angina pectoris: Secondary | ICD-10-CM | POA: Diagnosis not present

## 2021-08-01 DIAGNOSIS — I5020 Unspecified systolic (congestive) heart failure: Secondary | ICD-10-CM | POA: Diagnosis not present

## 2021-08-01 DIAGNOSIS — R0989 Other specified symptoms and signs involving the circulatory and respiratory systems: Secondary | ICD-10-CM | POA: Diagnosis not present

## 2021-08-01 DIAGNOSIS — I442 Atrioventricular block, complete: Secondary | ICD-10-CM | POA: Diagnosis not present

## 2021-08-01 DIAGNOSIS — E1169 Type 2 diabetes mellitus with other specified complication: Secondary | ICD-10-CM | POA: Diagnosis not present

## 2021-08-01 DIAGNOSIS — Z950 Presence of cardiac pacemaker: Secondary | ICD-10-CM | POA: Diagnosis not present

## 2021-08-02 ENCOUNTER — Encounter: Payer: Self-pay | Admitting: Internal Medicine

## 2021-08-02 DIAGNOSIS — R0609 Other forms of dyspnea: Secondary | ICD-10-CM | POA: Diagnosis not present

## 2021-08-02 DIAGNOSIS — I5020 Unspecified systolic (congestive) heart failure: Secondary | ICD-10-CM | POA: Diagnosis not present

## 2021-08-02 DIAGNOSIS — C642 Malignant neoplasm of left kidney, except renal pelvis: Secondary | ICD-10-CM | POA: Diagnosis not present

## 2021-08-02 DIAGNOSIS — I25119 Atherosclerotic heart disease of native coronary artery with unspecified angina pectoris: Secondary | ICD-10-CM | POA: Diagnosis not present

## 2021-08-02 DIAGNOSIS — I10 Essential (primary) hypertension: Secondary | ICD-10-CM | POA: Diagnosis not present

## 2021-08-02 DIAGNOSIS — R7989 Other specified abnormal findings of blood chemistry: Secondary | ICD-10-CM | POA: Diagnosis not present

## 2021-08-02 DIAGNOSIS — R0602 Shortness of breath: Secondary | ICD-10-CM | POA: Diagnosis not present

## 2021-08-02 DIAGNOSIS — Z955 Presence of coronary angioplasty implant and graft: Secondary | ICD-10-CM | POA: Diagnosis not present

## 2021-08-02 DIAGNOSIS — I639 Cerebral infarction, unspecified: Secondary | ICD-10-CM | POA: Diagnosis not present

## 2021-08-02 DIAGNOSIS — R0600 Dyspnea, unspecified: Secondary | ICD-10-CM | POA: Diagnosis not present

## 2021-08-02 DIAGNOSIS — R0789 Other chest pain: Secondary | ICD-10-CM | POA: Diagnosis not present

## 2021-08-02 DIAGNOSIS — I2510 Atherosclerotic heart disease of native coronary artery without angina pectoris: Secondary | ICD-10-CM | POA: Diagnosis not present

## 2021-08-02 DIAGNOSIS — Z950 Presence of cardiac pacemaker: Secondary | ICD-10-CM | POA: Diagnosis not present

## 2021-08-02 DIAGNOSIS — I255 Ischemic cardiomyopathy: Secondary | ICD-10-CM | POA: Diagnosis not present

## 2021-08-02 DIAGNOSIS — R079 Chest pain, unspecified: Secondary | ICD-10-CM | POA: Diagnosis not present

## 2021-08-02 DIAGNOSIS — E1169 Type 2 diabetes mellitus with other specified complication: Secondary | ICD-10-CM | POA: Diagnosis not present

## 2021-08-02 DIAGNOSIS — E785 Hyperlipidemia, unspecified: Secondary | ICD-10-CM | POA: Diagnosis not present

## 2021-08-03 ENCOUNTER — Encounter: Payer: Self-pay | Admitting: Family Medicine

## 2021-08-06 NOTE — Progress Notes (Signed)
HPI: [name] is a 76 y.o. female DM 2, CVA, CAD,HFrEF,HLD, and complete heart block status post pacemaker placement here today to follow on recent hospitalization.   She was last seen on 07/30/21 for SOB, D dimer was mildly elevated ,so she was instructed to go to the ER. Admitted from 07/31/21 and discharged home on 08/02/21. Dx'ed with ischemic cardiomyopathy, HFrEF. Coronary angiography and left cardiac cath (pressure only) 08/01/21:  1.  Severe 3V CAD.     A.  Occluded distal LAD.   B.  Occluded CFX.  SVG to OM patent.   C.  Occluded RCA.  Occluded stent in RCA, occluded SVG to RCA.  2.  1 of 3 grafts patent.  SVG to OM patent.  3.  LVEDP 15 mmHg.  4.  RFA access, closed with Perclose.  Echo 08/01/21: LVEF 30-35%,inferior and anterior hypokinesis, mild to moderate MR.  She was started on Entresto 24-26 mg bid, which she started 2 days ago. She is also on Jardiance 10 mg daily, Metoprolol succinate 50 mg daily, and Furosemide 20 mg daily.  SOB slightly better, feeling fatigue. Negative for orthopnea,PND,or edema. Sleeping on a pillow.  SOB exacerbated by moderate exertion and when carrying things while walking, alleviated by resting for a few minutes. No associated CP,palpitation,or diaphoresis. Her next appt with her cardiologist 08/15/21.  She is planning on traveling to [place], 09/02/21, to stay with her son for at least 6 months. When she is in [place] she has no SOB and fatigue is much better.  HypoK+: She is on KLOR 20 meq daily.   Ref Range & Units 5 d ago Comments  Sodium 135 - 146 MMOL/L 136    Potassium 3.5 - 5.3 MMOL/L 3.8    Chloride 98 - 110 MMOL/L 102    CO2 23 - 30 MMOL/L 27    BUN 8 - 24 MG/DL 16    Glucose 70 - 99 MG/DL 113 High   Patients taking eltrombopag at doses >/= 100 mg daily may show falsely elevated values of 10% or greater.  Creatinine 0.50 - 1.50 MG/DL 1.10    Calcium 8.5 - 10.5 MG/DL 8.6    Anion Gap 4 - 14 MMOL/L 7    Est. GFR >=60  ML/MIN/1.73 M*2 53 Low     Review of Systems  Constitutional:  Negative for appetite change and fever.  HENT:  Negative for mouth sores, nosebleeds and sore throat.   Eyes:  Negative for redness and visual disturbance.  Respiratory:  Negative for cough and wheezing.   Gastrointestinal:  Negative for abdominal pain, nausea and vomiting.       Negative for changes in bowel habits.  Genitourinary:  Negative for decreased urine volume, dysuria and hematuria.  Neurological:  Negative for syncope, weakness and headaches.  Rest see pertinent positives and negatives per HPI.  Current Outpatient Medications on File Prior to Visit  Medication Sig Dispense Refill   acetaminophen (TYLENOL) 650 MG CR tablet Take 650 mg by mouth every 8 (eight) hours as needed for pain.     aspirin EC 81 MG tablet Take 81 mg by mouth daily. Swallow whole.     cholecalciferol (VITAMIN D3) 25 MCG (1000 UT) tablet Take 1,000 Units by mouth daily.     clopidogrel (PLAVIX) 75 MG tablet Take 1 tablet (75 mg total) by mouth daily. 90 tablet 3   Cyanocobalamin (B-12) 2500 MCG TABS Take 1 tablet by mouth daily.     empagliflozin (  JARDIANCE) 10 MG TABS tablet Take 1 tablet (10 mg total) by mouth daily before breakfast. 30 tablet 3   ezetimibe (ZETIA) 10 MG tablet TAKE 1 TABLET BY MOUTH EVERY DAY 90 tablet 3   furosemide (LASIX) 20 MG tablet TAKE 2 TABLETS BY MOUTH DAILY FOR 5 DAYS THEN 1 TABLET DAILY 30 tablet 1   metoprolol succinate (TOPROL-XL) 50 MG 24 hr tablet TAKE 1 TABLET BY MOUTH DAILY. TAKE WITH OR IMMEDIATELY FOLLOWING A MEAL. 90 tablet 1   pantoprazole (PROTONIX) 40 MG tablet Take 1 tablet (40 mg total) by mouth daily. 90 tablet 3   rosuvastatin (CRESTOR) 40 MG tablet TAKE 1 TABLET BY MOUTH EVERYDAY AT BEDTIME 90 tablet 3   sacubitril-valsartan (ENTRESTO) 24-26 MG Take 1 tablet by mouth 2 (two) times daily.     sertraline (ZOLOFT) 50 MG tablet TAKE 1 TABLET BY MOUTH EVERY DAY 90 tablet 1   No current  facility-administered medications on file prior to visit.   Past Medical History:  Diagnosis Date   Acute ST segment elevation myocardial infarction [facility])    Arthritis    CAD (coronary artery disease) 02/15/2019   Cancer of left kidney (HCC)    Complete atrioventricular block (HCC)    DVT (deep venous thrombosis) ([place]) 02/15/2019   GI bleeding    History of cerebellar stroke 02/15/2019   HLD (hyperlipidemia) 02/15/2019   HTN (hypertension) 02/15/2019   Ischemic cardiomyopathy    Melena    Pacemaker    Pulmonary thromboembolism ([place]) 02/15/2019   Allergies  Allergen Reactions   Penicillins Rash    Social History   Socioeconomic History   Marital status: Widowed    Spouse name: Not on file   Number of children: 4   Years of education: Not on file   Highest education level: 12th grade  Occupational History   Occupation: retired  Tobacco Use   Smoking status: Former   Smokeless tobacco: Never  Scientific laboratory technician Use: Never used  Substance and Sexual Activity   Alcohol use: Never   Drug use: Never   Sexual activity: Not on file    Comment: MARRIED  Other Topics Concern   Not on file  Social History Narrative   Not on file   Social Determinants of Health   Financial Resource Strain: Low Risk    Difficulty of Paying Living Expenses: Not hard at all  Food Insecurity: No Food Insecurity   Worried About Charity fundraiser in the Last Year: Never true   [place] in the Last Year: Never true  Transportation Needs: No Transportation Needs   Lack of Transportation (Medical): No   Lack of Transportation (Non-Medical): No  Physical Activity: Insufficiently Active   Days of Exercise per Week: 3 days   Minutes of Exercise per Session: 20 min  Stress: No Stress Concern Present   Feeling of Stress : Only a little  Social Connections: Moderately Isolated   Frequency of Communication with Friends and Family: Twice a week   Frequency of Social Gatherings with  Friends and Family: More than three times a week   Attends Religious Services: Never   Marine scientist or Organizations: No   Attends Archivist Meetings: 1 to 4 times per year   Marital Status: Widowed   Vitals:   08/07/21 0756  BP: 126/70  Pulse: 78  Resp: 16  Temp: 98.4 F (36.9 C)  SpO2: 94%   Body mass index  is 30.36 kg/m.  Physical Exam Vitals and nursing note reviewed.  Constitutional:      General: She is not in acute distress.    Appearance: She is well-developed.  HENT:     Head: Normocephalic and atraumatic.  Eyes:     Conjunctiva/sclera: Conjunctivae normal.  Neck:     Vascular: No JVD.  Cardiovascular:     Rate and Rhythm: Normal rate and regular rhythm.     Pulses:          Dorsalis pedis pulses are 2+ on the right side and 2+ on the left side.     Heart sounds: No murmur heard. Pulmonary:     Effort: Pulmonary effort is normal. No respiratory distress.     Breath sounds: Normal breath sounds.  Abdominal:     Palpations: Abdomen is soft. There is no hepatomegaly or mass.     Tenderness: There is no abdominal tenderness.  Lymphadenopathy:     Cervical: No cervical adenopathy.  Skin:    General: Skin is warm.     Findings: No erythema or rash.  Neurological:     Mental Status: She is alert and oriented to person, place, and time.     Comments: Gait mildly unstable, not assisted.  Psychiatric:     Comments: Well groomed, good eye contact.   ASSESSMENT AND PLAN:  [name] was seen today for hospitalization follow-up.  Diagnoses and all orders for this visit: Orders Placed This Encounter  Procedures   Basic metabolic panel   Lab Results  Component Value Date   CREATININE 1.01 08/07/2021   BUN 16 08/07/2021   NA 138 08/07/2021   K 4.5 08/07/2021   K 4.5 08/07/2021   CL 101 08/07/2021   CO2 28 08/07/2021   HFrEF (heart failure with reduced ejection fraction) ([place]) LVEF has declined since 2021. No signs of volume overload  today. Continue Metoprolol,entresto,and Jardiance same dose. No changes in Furosemide. Continue low salt diet. She has appt with cardiologist 08/15/21.  Hypokalemia She recently started [name] and currently on KLOR 20 meq daily. BMP today, recommendations will be given according to lab results.  Coronary artery disease involving native coronary artery of native heart, unspecified whether angina present LDL is not at goal, 140 in 05/2021. She has not been interested in PCSK9 inh. On Zetia 10 mg and Crestor 40 mg daily. Continue Plavix 75 mg daily and Metoprolol succinate 50 mg daily.  Essential (primary) hypertension BP adequately controlled. Continue Entresto and Metoprolol succinate same dose. Low salt diet.  I spent a total of 37 minutes in both face to face and non face to face activities for this visit on the date of this encounter. During this time history was obtained and documented, examination was performed, prior labs/imaging reviewed, and assessment/plan discussed.  Return if symptoms worsen or fail to improve, for Keep next f/u appt.  Gennie Dib G. [place], MD  [facility]. [place] office.

## 2021-08-07 ENCOUNTER — Ambulatory Visit (INDEPENDENT_AMBULATORY_CARE_PROVIDER_SITE_OTHER): Payer: Medicare HMO | Admitting: Family Medicine

## 2021-08-07 ENCOUNTER — Encounter: Payer: Self-pay | Admitting: Family Medicine

## 2021-08-07 VITALS — BP 126/70 | HR 78 | Temp 98.4°F | Resp 16 | Ht 63.0 in | Wt 171.4 lb

## 2021-08-07 DIAGNOSIS — I2510 Atherosclerotic heart disease of native coronary artery without angina pectoris: Secondary | ICD-10-CM

## 2021-08-07 DIAGNOSIS — I5020 Unspecified systolic (congestive) heart failure: Secondary | ICD-10-CM | POA: Diagnosis not present

## 2021-08-07 DIAGNOSIS — E876 Hypokalemia: Secondary | ICD-10-CM | POA: Diagnosis not present

## 2021-08-07 DIAGNOSIS — I10 Essential (primary) hypertension: Secondary | ICD-10-CM

## 2021-08-07 LAB — BASIC METABOLIC PANEL
BUN: 16 mg/dL (ref 6–23)
CO2: 28 mEq/L (ref 19–32)
Calcium: 9.9 mg/dL (ref 8.4–10.5)
Chloride: 101 mEq/L (ref 96–112)
Creatinine, Ser: 1.01 mg/dL (ref 0.40–1.20)
GFR: 55.03 mL/min — ABNORMAL LOW (ref >60.00)
Glucose, Bld: 132 mg/dL — ABNORMAL HIGH (ref 70–99)
Potassium: 4.5 mEq/L (ref 3.5–5.1)
Sodium: 138 mEq/L (ref 135–145)

## 2021-08-07 LAB — POTASSIUM: Potassium: 4.5 mEq/L (ref 3.5–5.1)

## 2021-08-07 NOTE — Patient Instructions (Signed)
A few things to remember from today's visit: ? ? ?HFrEF (heart failure with reduced ejection fraction) ([place]) ? ?Hypokalemia - Plan: Basic metabolic panel ? ?Coronary artery disease involving native coronary artery of native heart, unspecified whether angina present ? ?Essential (primary) hypertension ? ?If you need refills please call your pharmacy. ?Do not use My Chart to request refills or for acute issues that need immediate attention. ?  ?No changes today. ?We are checking potassium. ?May ask you to increase Jardiance to 25 mg if blood sugars persistently elevated, before breakfast goal is under 140 and 2 hours after meals under 190. ? ?Please be sure medication list is accurate. ?If a new problem present, please set up appointment sooner than planned today. ? ? ? ? ? ? ? ?

## 2021-08-08 ENCOUNTER — Encounter: Payer: Self-pay | Admitting: Family Medicine

## 2021-08-08 NOTE — Progress Notes (Signed)
Electrophysiology Office Note Date: 08/08/2021  ID:  April Lowery, DOB 08/11/1945, MRN [id number]  PCP: [place], Betty G, MD Primary Cardiologist: Cristopher Peru, MD Electrophysiologist: Cristopher Peru, MD   CC: Pacemaker follow-up  April Lowery is a 76 y.o. female seen today for Cristopher Peru, MD for routine electrophysiology followup.    She was hospitalized in [place] earlier this month with SOB and exertion. Had + D dimer. And mildly elevated but flat troponin. With h/o of CABG she underwent cath that showed severe disease as below but no target. Medical therapy heightened  Since discharge from hospital the patient reports doing a little better every day. She plans to go to [place] in June with potential plans not to return at all. She may live permanently with her son there.  she denies chest pain, palpitations, dyspnea, PND, orthopnea, nausea, vomiting, dizziness, syncope, edema, weight gain, or early satiety.  Device History: MDT dual chamber PPM implanted11/21/2019  Past Medical History:  Diagnosis Date   Acute ST segment elevation myocardial infarction [facility])    Arthritis    CAD (coronary artery disease) 02/15/2019   Cancer of left kidney (HCC)    Complete atrioventricular block (HCC)    DVT (deep venous thrombosis) ([facility]) 02/15/2019   GI bleeding    History of cerebellar stroke 02/15/2019   HLD (hyperlipidemia) 02/15/2019   HTN (hypertension) 02/15/2019   Ischemic cardiomyopathy    [place]    Pacemaker    Pulmonary thromboembolism ([facility]) 02/15/2019   Past Surgical History:  Procedure Laterality Date   CHOLECYSTECTOMY     CORONARY ARTERY BYPASS GRAFT     STENTS   PACEMAKER INSERTION     TONSILLECTOMY AND ADENOIDECTOMY  2017    Current Outpatient Medications  Medication Sig Dispense Refill   acetaminophen (TYLENOL) 650 MG CR tablet Take 650 mg by mouth every 8 (eight) hours as needed for pain.     aspirin EC 81 MG tablet Take 81 mg by mouth daily. Swallow whole.      cholecalciferol (VITAMIN D3) 25 MCG (1000 UT) tablet Take 1,000 Units by mouth daily.     clopidogrel (PLAVIX) 75 MG tablet Take 1 tablet (75 mg total) by mouth daily. 90 tablet 3   Cyanocobalamin (B-12) 2500 MCG TABS Take 1 tablet by mouth daily.     empagliflozin (JARDIANCE) 10 MG TABS tablet Take 1 tablet (10 mg total) by mouth daily before breakfast. 30 tablet 3   ezetimibe (ZETIA) 10 MG tablet TAKE 1 TABLET BY MOUTH EVERY DAY 90 tablet 3   furosemide (LASIX) 20 MG tablet TAKE 2 TABLETS BY MOUTH DAILY FOR 5 DAYS THEN 1 TABLET DAILY 30 tablet 1   metoprolol succinate (TOPROL-XL) 50 MG 24 hr tablet TAKE 1 TABLET BY MOUTH DAILY. TAKE WITH OR IMMEDIATELY FOLLOWING A MEAL. 90 tablet 1   pantoprazole (PROTONIX) 40 MG tablet Take 1 tablet (40 mg total) by mouth daily. 90 tablet 3   potassium chloride SA (KLOR-CON M) 20 MEQ tablet Take 1 tablet (20 mEq total) by mouth daily for 7 days. 7 tablet 0   rosuvastatin (CRESTOR) 40 MG tablet TAKE 1 TABLET BY MOUTH EVERYDAY AT BEDTIME 90 tablet 3   sacubitril-valsartan (ENTRESTO) 24-26 MG Take 1 tablet by mouth 2 (two) times daily.     sertraline (ZOLOFT) 50 MG tablet TAKE 1 TABLET BY MOUTH EVERY DAY 90 tablet 1   No current facility-administered medications for this visit.    Allergies:   Penicillins  Social History: Social History   Socioeconomic History   Marital status: Widowed    Spouse name: Not on file   Number of children: 4   Years of education: Not on file   Highest education level: 12th grade  Occupational History   Occupation: retired  Tobacco Use   Smoking status: Former   Smokeless tobacco: Never  Scientific laboratory technician Use: Never used  Substance and Sexual Activity   Alcohol use: Never   Drug use: Never   Sexual activity: Not on file    Comment: MARRIED  Other Topics Concern   Not on file  Social History Narrative   Not on file   Social Determinants of Health   Financial Resource Strain: Low Risk    Difficulty of Paying  Living Expenses: Not hard at all  Food Insecurity: No Food Insecurity   Worried About Charity fundraiser in the Last Year: Never true   [place] in the Last Year: Never true  Transportation Needs: No Transportation Needs   Lack of Transportation (Medical): No   Lack of Transportation (Non-Medical): No  Physical Activity: Insufficiently Active   Days of Exercise per Week: 3 days   Minutes of Exercise per Session: 20 min  Stress: No Stress Concern Present   Feeling of Stress : Only a little  Social Connections: Moderately Isolated   Frequency of Communication with Friends and Family: Twice a week   Frequency of Social Gatherings with Friends and Family: More than three times a week   Attends Religious Services: Never   Marine scientist or Organizations: No   Attends Archivist Meetings: 1 to 4 times per year   Marital Status: Widowed  Human resources officer Violence: Not At Risk   Fear of Current or Ex-Partner: No   Emotionally Abused: No   Physically Abused: No   Sexually Abused: No    Family History: Family History  Problem Relation Age of Onset   Heart disease Mother    Heart attack Mother    Heart disease Father    Heart attack Father    Heart disease Brother    Heart attack Brother    Heart attack Maternal Grandmother    Heart attack Maternal Grandfather    Heart attack Paternal Grandmother    Heart attack Paternal Grandfather      Review of Systems: All other systems reviewed and are otherwise negative except as noted above.  Physical Exam: There were no vitals filed for this visit.   GEN- The patient is well appearing, alert and oriented x 3 today.   HEENT: normocephalic, atraumatic; sclera clear, conjunctiva pink; hearing intact; oropharynx clear; neck supple  Lungs- Clear to ausculation bilaterally, normal work of breathing.  No wheezes, rales, rhonchi Heart- Regular rate and rhythm, no murmurs, rubs or gallops  GI- soft, non-tender,  non-distended, bowel sounds present  Extremities- no clubbing or cyanosis. No edema MS- no significant deformity or atrophy Skin- warm and dry, no rash or lesion; PPM pocket well healed Psych- euthymic mood, full affect Neuro- strength and sensation are intact  PPM Interrogation- reviewed in detail today,  See PACEART report  EKG:  EKG is not ordered today.  Recent Labs: 06/18/2021: ALT 16 07/30/2021: Hemoglobin 13.3; Platelets 246.0; TSH 3.21 08/07/2021: BUN 16; Creatinine, Ser 1.01; Potassium 4.5; Potassium 4.5; Sodium 138   Wt Readings from Last 3 Encounters:  08/07/21 171 lb 6 oz (77.7 kg)  07/30/21 171 lb 8  oz (77.8 kg)  06/19/21 172 lb 3.2 oz (78.1 kg)     Other studies Reviewed: Additional studies/ records that were reviewed today include: Previous EP office notes, Previous remote checks, Most recent labwork.   Cath 08/01/2021 ([facility]) CONCLUSIONS:    1.  Severe 3V CAD.     A.  Occluded distal LAD.  (Either not grafted or graft occluded.)   B.  Occluded CFX.  SVG to OM patent.   C.  Occluded RCA.  Occluded stent in RCA, occluded SVG to RCA.  2.  1 of 3 grafts patent.  SVG to OM patent.  3.  LVEDP 15 mmHg.  4.  RFA access, closed with Perclose.    RECOMMENDATIONS:  1.  Maximize medical therapy for CAD, CHF, and ICM.  2.  Per Dr. Andria Frames.   Assessment and Plan:  1. Intermittent CHB s/p Medtronic PPM  Normal PPM function See Pace Art report No changes today Not device dependent  2. CAD Stable, severe disease as above.  Recommended gen cards to establish and follow. She has plans to do this in [place].  Will follow up here as needed.  3. Chronic systolic CHF BMET today.  Offered spiro, but prefers no changes during transition period to [place].  Stressed importance of continued and close follow up.   Current medicines are reviewed at length with the patient today.    Labs/ tests ordered today include:  Orders Placed This Encounter  Procedures   Basic  metabolic panel   Disposition:   Follow up with Dr. Lovena Le in 12 months, prn if stays in [place].    Jacalyn Lefevre, PA-C  08/08/2021 12:09 PM  [place] [address] [phone] (office) [phone] (fax)

## 2021-08-15 ENCOUNTER — Ambulatory Visit: Payer: Medicare HMO | Admitting: Student

## 2021-08-15 VITALS — BP 110/60 | HR 76 | Ht 65.0 in | Wt 175.2 lb

## 2021-08-15 DIAGNOSIS — I442 Atrioventricular block, complete: Secondary | ICD-10-CM

## 2021-08-15 DIAGNOSIS — I5022 Chronic systolic (congestive) heart failure: Secondary | ICD-10-CM | POA: Diagnosis not present

## 2021-08-15 DIAGNOSIS — I2510 Atherosclerotic heart disease of native coronary artery without angina pectoris: Secondary | ICD-10-CM

## 2021-08-15 LAB — CUP PACEART INCLINIC DEVICE CHECK
Battery Remaining Longevity: 132 mo
Battery Voltage: 3.02 V
Brady Statistic AP VP Percent: 7.32 %
Brady Statistic AP VS Percent: 36.68 %
Brady Statistic AS VP Percent: 6.81 %
Brady Statistic AS VS Percent: 49.19 %
Brady Statistic RA Percent Paced: 43.99 %
Brady Statistic RV Percent Paced: 14.13 %
Date Time Interrogation Session: 20230524111209
Implantable Lead Implant Date: 20191121
Implantable Lead Implant Date: 20191121
Implantable Lead Location: 753859
Implantable Lead Location: 753860
Implantable Lead Model: 4076
Implantable Lead Model: 4076
Implantable Pulse Generator Implant Date: 20191121
Lead Channel Impedance Value: 361 Ohm
Lead Channel Impedance Value: 399 Ohm
Lead Channel Impedance Value: 456 Ohm
Lead Channel Impedance Value: 475 Ohm
Lead Channel Pacing Threshold Amplitude: 0.625 V
Lead Channel Pacing Threshold Amplitude: 0.875 V
Lead Channel Pacing Threshold Pulse Width: 0.4 ms
Lead Channel Pacing Threshold Pulse Width: 0.4 ms
Lead Channel Sensing Intrinsic Amplitude: 2.125 mV
Lead Channel Sensing Intrinsic Amplitude: 2.625 mV
Lead Channel Sensing Intrinsic Amplitude: 22.625 mV
Lead Channel Sensing Intrinsic Amplitude: 24.625 mV
Lead Channel Setting Pacing Amplitude: 1.5 V
Lead Channel Setting Pacing Amplitude: 2 V
Lead Channel Setting Pacing Pulse Width: 0.4 ms
Lead Channel Setting Sensing Sensitivity: 1.2 mV

## 2021-08-15 LAB — BASIC METABOLIC PANEL
BUN/Creatinine Ratio: 12 (ref 12–28)
BUN: 13 mg/dL (ref 8–27)
CO2: 25 mmol/L (ref 20–29)
Calcium: 9.3 mg/dL (ref 8.7–10.3)
Chloride: 101 mmol/L (ref 96–106)
Creatinine, Ser: 1.05 mg/dL — ABNORMAL HIGH (ref 0.57–1.00)
Glucose: 149 mg/dL — ABNORMAL HIGH (ref 70–99)
Potassium: 3.9 mmol/L (ref 3.5–5.2)
Sodium: 140 mmol/L (ref 134–144)
eGFR: 56 mL/min/{1.73_m2} — ABNORMAL LOW (ref >59)

## 2021-08-15 NOTE — Patient Instructions (Signed)
Medication Instructions:  Your physician recommends that you continue on your current medications as directed. Please refer to the Current Medication list given to you today.  *If you need a refill on your cardiac medications before your next appointment, please call your pharmacy*   Lab Work: TODAY: BMET  If you have labs (blood work) drawn today and your tests are completely normal, you will receive your results only by: [place] (if you have MyChart) OR A paper copy in the mail If you have any lab test that is abnormal or we need to change your treatment, we will call you to review the results.  Follow-Up: At [facility], you and your health needs are our priority.  As part of our continuing mission to provide you with exceptional heart care, we have created designated Provider Care Teams.  These Care Teams include your primary Cardiologist (physician) and Advanced Practice Providers (APPs -  Physician Assistants and Nurse Practitioners) who all work together to provide you with the care you need, when you need it.  Your next appointment:   1 year(s)  The format for your next appointment:   In Person  Provider:   Cristopher Peru, MD{

## 2021-08-19 ENCOUNTER — Other Ambulatory Visit: Payer: Self-pay | Admitting: Family Medicine

## 2021-09-05 ENCOUNTER — Other Ambulatory Visit: Payer: Self-pay | Admitting: Family Medicine

## 2021-09-05 ENCOUNTER — Ambulatory Visit (INDEPENDENT_AMBULATORY_CARE_PROVIDER_SITE_OTHER): Payer: Medicare HMO

## 2021-09-05 DIAGNOSIS — I255 Ischemic cardiomyopathy: Secondary | ICD-10-CM | POA: Diagnosis not present

## 2021-09-07 LAB — CUP PACEART REMOTE DEVICE CHECK
Battery Remaining Longevity: 131 mo
Battery Voltage: 3.02 V
Brady Statistic AP VP Percent: 5.22 %
Brady Statistic AP VS Percent: 34.89 %
Brady Statistic AS VP Percent: 5.26 %
Brady Statistic AS VS Percent: 54.63 %
Brady Statistic RA Percent Paced: 40.13 %
Brady Statistic RV Percent Paced: 10.47 %
Date Time Interrogation Session: 20230614033123
Implantable Lead Implant Date: 20191121
Implantable Lead Implant Date: 20191121
Implantable Lead Location: 753859
Implantable Lead Location: 753860
Implantable Lead Model: 4076
Implantable Lead Model: 4076
Implantable Pulse Generator Implant Date: 20191121
Lead Channel Impedance Value: 361 Ohm
Lead Channel Impedance Value: 418 Ohm
Lead Channel Impedance Value: 475 Ohm
Lead Channel Impedance Value: 494 Ohm
Lead Channel Pacing Threshold Amplitude: 0.625 V
Lead Channel Pacing Threshold Amplitude: 0.875 V
Lead Channel Pacing Threshold Pulse Width: 0.4 ms
Lead Channel Pacing Threshold Pulse Width: 0.4 ms
Lead Channel Sensing Intrinsic Amplitude: 26.75 mV
Lead Channel Sensing Intrinsic Amplitude: 26.75 mV
Lead Channel Sensing Intrinsic Amplitude: 3.125 mV
Lead Channel Sensing Intrinsic Amplitude: 3.125 mV
Lead Channel Setting Pacing Amplitude: 1.5 V
Lead Channel Setting Pacing Amplitude: 2 V
Lead Channel Setting Pacing Pulse Width: 0.4 ms
Lead Channel Setting Sensing Sensitivity: 1.2 mV

## 2021-09-17 ENCOUNTER — Encounter: Payer: Self-pay | Admitting: Family Medicine

## 2021-09-17 DIAGNOSIS — R0609 Other forms of dyspnea: Secondary | ICD-10-CM

## 2021-09-17 DIAGNOSIS — I2510 Atherosclerotic heart disease of native coronary artery without angina pectoris: Secondary | ICD-10-CM

## 2021-09-17 DIAGNOSIS — I5020 Unspecified systolic (congestive) heart failure: Secondary | ICD-10-CM

## 2021-10-01 ENCOUNTER — Encounter: Payer: Self-pay | Admitting: Family Medicine

## 2021-10-01 MED ORDER — ENTRESTO 24-26 MG PO TABS
1.0000 | ORAL_TABLET | Freq: Two times a day (BID) | ORAL | 2 refills | Status: DC
Start: 2021-10-01 — End: 2022-06-04

## 2021-10-04 ENCOUNTER — Telehealth: Payer: Self-pay | Admitting: Family Medicine

## 2021-10-04 NOTE — Telephone Encounter (Signed)
Left message for patient to call back and schedule Medicare Annual Wellness Visit (AWV) either virtually or in office. Left  my Herbie Drape number [phone]   Last AWV ;10/06/20 please schedule at anytime with [facility] Nurse Health Advisor 1 or 2

## 2021-10-04 NOTE — Telephone Encounter (Signed)
Pt returned my call she stated she was in [place] and would call back to schedule when she gets home

## 2021-10-09 ENCOUNTER — Encounter: Payer: Medicare Other | Admitting: Family Medicine

## 2021-10-18 ENCOUNTER — Encounter: Payer: Self-pay | Admitting: Family Medicine

## 2021-10-19 MED ORDER — PANTOPRAZOLE SODIUM 40 MG PO TBEC: 40.0000 mg | DELAYED_RELEASE_TABLET | Freq: Every day | ORAL | 3 refills | Status: DC

## 2021-10-22 ENCOUNTER — Encounter: Payer: Self-pay | Admitting: Family Medicine

## 2021-11-14 ENCOUNTER — Ambulatory Visit (INDEPENDENT_AMBULATORY_CARE_PROVIDER_SITE_OTHER): Payer: Medicare HMO

## 2021-11-14 VITALS — Ht 65.0 in | Wt 170.0 lb

## 2021-11-14 DIAGNOSIS — Z0000 Encounter for general adult medical examination without abnormal findings: Secondary | ICD-10-CM | POA: Diagnosis not present

## 2021-11-14 NOTE — Patient Instructions (Addendum)
April Lowery , Thank you for taking time to come for your Medicare Wellness Visit. I appreciate your ongoing commitment to your health goals. Please review the following plan we discussed and let me know if I can assist you in the future.   These are the goals we discussed:  Goals       Exercise 3x per week (30 min per time)      Low impact exercise 3-4 times per week. Ideal for you aquatic exercises.  Continue a low fat and low salt diet.      No current goals (pt-stated)        This is a list of the screening recommended for you and due dates:  Health Maintenance  Topic Date Due   Flu Shot  10/23/2021   COVID-19 Vaccine (3 - Pfizer series) 11/30/2021*   Zoster (Shingles) Vaccine (1 of 2) 02/14/2022*   Pneumonia Vaccine (2 - PPSV23 or PCV20) 11/15/2022*   Hemoglobin A1C  12/19/2021   Eye exam for diabetics  06/10/2022   Complete foot exam   06/19/2022   Colon Cancer Screening  09/24/2024   Hepatitis C Screening: USPSTF Recommendation to screen - Ages 18-79 yo.  Completed   HPV Vaccine  Aged Out   Mammogram  Discontinued   DEXA scan (bone density measurement)  Discontinued   Tetanus Vaccine  Discontinued  *Topic was postponed. The date shown is not the original due date.    Advanced directives: Yes  Conditions/risks identified: None  Next appointment: Follow up in one year for your annual wellness visit     Preventive Care 65 Years and Older, Female Preventive care refers to lifestyle choices and visits with your health care provider that can promote health and wellness. What does preventive care include? A yearly physical exam. This is also called an annual well check. Dental exams once or twice a year. Routine eye exams. Ask your health care provider how often you should have your eyes checked. Personal lifestyle choices, including: Daily care of your teeth and gums. Regular physical activity. Eating a healthy diet. Avoiding tobacco and drug use. Limiting alcohol  use. Practicing safe sex. Taking low-dose aspirin every day. Taking vitamin and mineral supplements as recommended by your health care provider. What happens during an annual well check? The services and screenings done by your health care provider during your annual well check will depend on your age, overall health, lifestyle risk factors, and family history of disease. Counseling  Your health care provider may ask you questions about your: Alcohol use. Tobacco use. Drug use. Emotional well-being. Home and relationship well-being. Sexual activity. Eating habits. History of falls. Memory and ability to understand (cognition). Work and work Statistician. Reproductive health. Screening  You may have the following tests or measurements: Height, weight, and BMI. Blood pressure. Lipid and cholesterol levels. These may be checked every 5 years, or more frequently if you are over 22 years old. Skin check. Lung cancer screening. You may have this screening every year starting at age 43 if you have a 30-pack-year history of smoking and currently smoke or have quit within the past 15 years. Fecal occult blood test (FOBT) of the stool. You may have this test every year starting at age 56. Flexible sigmoidoscopy or colonoscopy. You may have a sigmoidoscopy every 5 years or a colonoscopy every 10 years starting at age 69. Hepatitis C blood test. Hepatitis B blood test. Sexually transmitted disease (STD) testing. Diabetes screening. This is done by checking  your blood sugar (glucose) after you have not eaten for a while (fasting). You may have this done every 1-3 years. Bone density scan. This is done to screen for osteoporosis. You may have this done starting at age 88. Mammogram. This may be done every 1-2 years. Talk to your health care provider about how often you should have regular mammograms. Talk with your health care provider about your test results, treatment options, and if necessary,  the need for more tests. Vaccines  Your health care provider may recommend certain vaccines, such as: Influenza vaccine. This is recommended every year. Tetanus, diphtheria, and acellular pertussis (Tdap, Td) vaccine. You may need a Td booster every 10 years. Zoster vaccine. You may need this after age 47. Pneumococcal 13-valent conjugate (PCV13) vaccine. One dose is recommended after age 18. Pneumococcal polysaccharide (PPSV23) vaccine. One dose is recommended after age 65. Talk to your health care provider about which screenings and vaccines you need and how often you need them. This information is not intended to replace advice given to you by your health care provider. Make sure you discuss any questions you have with your health care provider. Document Released: 04/07/2015 Document Revised: 11/29/2015 Document Reviewed: 01/10/2015 Elsevier Interactive Patient Education  2017 [place] Prevention in the Home Falls can cause injuries. They can happen to people of all ages. There are many things you can do to make your home safe and to help prevent falls. What can I do on the outside of my home? Regularly fix the edges of walkways and driveways and fix any cracks. Remove anything that might make you trip as you walk through a door, such as a raised step or threshold. Trim any bushes or trees on the path to your home. Use bright outdoor lighting. Clear any walking paths of anything that might make someone trip, such as rocks or tools. Regularly check to see if handrails are loose or broken. Make sure that both sides of any steps have handrails. Any raised decks and porches should have guardrails on the edges. Have any leaves, snow, or ice cleared regularly. Use sand or salt on walking paths during winter. Clean up any spills in your garage right away. This includes oil or grease spills. What can I do in the bathroom? Use night lights. Install grab bars by the toilet and in the  tub and shower. Do not use towel bars as grab bars. Use non-skid mats or decals in the tub or shower. If you need to sit down in the shower, use a plastic, non-slip stool. Keep the floor dry. Clean up any water that spills on the floor as soon as it happens. Remove soap buildup in the tub or shower regularly. Attach bath mats securely with double-sided non-slip rug tape. Do not have throw rugs and other things on the floor that can make you trip. What can I do in the bedroom? Use night lights. Make sure that you have a light by your bed that is easy to reach. Do not use any sheets or blankets that are too big for your bed. They should not hang down onto the floor. Have a firm chair that has side arms. You can use this for support while you get dressed. Do not have throw rugs and other things on the floor that can make you trip. What can I do in the kitchen? Clean up any spills right away. Avoid walking on wet floors. Keep items that you use a lot  in easy-to-reach places. If you need to reach something above you, use a strong step stool that has a grab bar. Keep electrical cords out of the way. Do not use floor polish or wax that makes floors slippery. If you must use wax, use non-skid floor wax. Do not have throw rugs and other things on the floor that can make you trip. What can I do with my stairs? Do not leave any items on the stairs. Make sure that there are handrails on both sides of the stairs and use them. Fix handrails that are broken or loose. Make sure that handrails are as long as the stairways. Check any carpeting to make sure that it is firmly attached to the stairs. Fix any carpet that is loose or worn. Avoid having throw rugs at the top or bottom of the stairs. If you do have throw rugs, attach them to the floor with carpet tape. Make sure that you have a light switch at the top of the stairs and the bottom of the stairs. If you do not have them, ask someone to add them for  you. What else can I do to help prevent falls? Wear shoes that: Do not have high heels. Have rubber bottoms. Are comfortable and fit you well. Are closed at the toe. Do not wear sandals. If you use a stepladder: Make sure that it is fully opened. Do not climb a closed stepladder. Make sure that both sides of the stepladder are locked into place. Ask someone to hold it for you, if possible. Clearly mark and make sure that you can see: Any grab bars or handrails. First and last steps. Where the edge of each step is. Use tools that help you move around (mobility aids) if they are needed. These include: Canes. Walkers. Scooters. Crutches. Turn on the lights when you go into a dark area. Replace any light bulbs as soon as they burn out. Set up your furniture so you have a clear path. Avoid moving your furniture around. If any of your floors are uneven, fix them. If there are any pets around you, be aware of where they are. Review your medicines with your doctor. Some medicines can make you feel dizzy. This can increase your chance of falling. Ask your doctor what other things that you can do to help prevent falls. This information is not intended to replace advice given to you by your health care provider. Make sure you discuss any questions you have with your health care provider. Document Released: 01/05/2009 Document Revised: 08/17/2015 Document Reviewed: 04/15/2014 Elsevier Interactive Patient Education  2017 [employer].

## 2021-11-14 NOTE — Progress Notes (Signed)
Subjective:   April Lowery is a 76 y.o. female who presents for Medicare Annual (Subsequent) preventive examination.  Review of Systems    Virtual Visit via Telephone Note  I connected with  April Lowery on 11/14/21 at  3:45 PM EDT by telephone and verified that I am speaking with the correct person using two identifiers.  Location: Patient: Home Provider: Office Persons participating in the virtual visit: patient/Nurse Health Advisor   I discussed the limitations, risks, security and privacy concerns of performing an evaluation and management service by telephone and the availability of in person appointments. The patient expressed understanding and agreed to proceed.  Interactive audio and video telecommunications were attempted between this nurse and patient, however failed, due to patient having technical difficulties OR patient did not have access to video capability.  We continued and completed visit with audio only.  Some vital signs may be absent or patient reported.   Criselda Peaches, LPN  Cardiac Risk Factors include: advanced age (>67mn, >>107women);hypertension     Objective:    Today's Vitals   11/14/21 1551  Weight: 170 lb (77.1 kg)  Height: '5\' 5"'$  (1.651 m)   Body mass index is 28.29 kg/m.     11/14/2021    3:58 PM 10/06/2020    8:22 AM 10/21/2019    6:00 PM 10/07/2019    9:00 PM 10/06/2019    2:54 PM  Advanced Directives  Does Patient Have a Medical Advance Directive? Yes Yes Yes Yes Yes  Type of AParamedicof A[place]Living will HH[place]Living will HRo[place]Living will HPrestburyLiving will HCoqu[place]Living will  Does patient want to make changes to medical advance directive? No - Patient declined   No - Patient declined   Copy of [place] Chart? No - copy requested No - copy requested   No - copy requested    Current  Medications (verified) Outpatient Encounter Medications as of 11/14/2021  Medication Sig   acetaminophen (TYLENOL) 650 MG CR tablet Take 650 mg by mouth every 8 (eight) hours as needed for pain.   aspirin EC 81 MG tablet Take 81 mg by mouth daily. Swallow whole.   cholecalciferol (VITAMIN D3) 25 MCG (1000 UT) tablet Take 1,000 Units by mouth daily.   clopidogrel (PLAVIX) 75 MG tablet Take 1 tablet (75 mg total) by mouth daily.   Cyanocobalamin (B-12) 2500 MCG TABS Take 1 tablet by mouth daily.   ezetimibe (ZETIA) 10 MG tablet TAKE 1 TABLET BY MOUTH EVERY DAY   furosemide (LASIX) 20 MG tablet Take 1 tablet (20 mg total) by mouth daily.   JARDIANCE 10 MG TABS tablet TAKE 1 TABLET BY MOUTH DAILY BEFORE BREAKFAST.   metoprolol succinate (TOPROL-XL) 50 MG 24 hr tablet TAKE 1 TABLET BY MOUTH DAILY. TAKE WITH OR IMMEDIATELY FOLLOWING A MEAL.   pantoprazole (PROTONIX) 40 MG tablet Take 1 tablet (40 mg total) by mouth daily.   rosuvastatin (CRESTOR) 40 MG tablet TAKE 1 TABLET BY MOUTH EVERYDAY AT BEDTIME   sacubitril-valsartan (ENTRESTO) 24-26 MG Take 1 tablet by mouth 2 (two) times daily.   sertraline (ZOLOFT) 50 MG tablet TAKE 1 TABLET BY MOUTH EVERY DAY   No facility-administered encounter medications on file as of 11/14/2021.    Allergies (verified) Penicillins   History: Past Medical History:  Diagnosis Date   Acute ST segment elevation myocardial infarction ([facility]    Arthritis  CAD (coronary artery disease) 02/15/2019   Cancer of left kidney (HCC)    Complete atrioventricular block (HCC)    DVT (deep venous thrombosis) ([place]) 02/15/2019   GI bleeding    History of cerebellar stroke 02/15/2019   HLD (hyperlipidemia) 02/15/2019   HTN (hypertension) 02/15/2019   Ischemic cardiomyopathy    Melena    Pacemaker    Pulmonary thromboembolism ([place]) 02/15/2019   Past Surgical History:  Procedure Laterality Date   CHOLECYSTECTOMY     CORONARY ARTERY BYPASS GRAFT     STENTS   PACEMAKER  INSERTION     TONSILLECTOMY AND ADENOIDECTOMY  2017   Family History  Problem Relation Age of Onset   Heart disease Mother    Heart attack Mother    Heart disease Father    Heart attack Father    Heart disease Brother    Heart attack Brother    Heart attack Maternal Grandmother    Heart attack Maternal Grandfather    Heart attack Paternal Grandmother    Heart attack Paternal Grandfather    Social History   Socioeconomic History   Marital status: Widowed    Spouse name: Not on file   Number of children: 4   Years of education: Not on file   Highest education level: 12th grade  Occupational History   Occupation: retired  Tobacco Use   Smoking status: Former   Smokeless tobacco: Never  Scientific laboratory technician Use: Never used  Substance and Sexual Activity   Alcohol use: Never   Drug use: Never   Sexual activity: Not on file    Comment: MARRIED  Other Topics Concern   Not on file  Social History Narrative   Not on file   Social Determinants of Health   Financial Resource Strain: Low Risk  (11/14/2021)   Overall Financial Resource Strain (CARDIA)    Difficulty of Paying Living Expenses: Not hard at all  Food Insecurity: No Food Insecurity (11/14/2021)   Hunger Vital Sign    Worried About Running Out of Food in the Last Year: Never true    [place] in the Last Year: Never true  Transportation Needs: No Transportation Needs (11/14/2021)   PRAPARE - Hydrologist (Medical): No    Lack of Transportation (Non-Medical): No  Physical Activity: Inactive (11/14/2021)   Exercise Vital Sign    Days of Exercise per Week: 0 days    Minutes of Exercise per Session: 20 min  Stress: No Stress Concern Present (11/14/2021)   [place]    Feeling of Stress : Not at all  Social Connections: Socially Isolated (11/14/2021)   Social Connection and Isolation Panel [NHANES]    Frequency of  Communication with Friends and Family: More than three times a week    Frequency of Social Gatherings with Friends and Family: More than three times a week    Attends Religious Services: Never    Marine scientist or Organizations: No    Attends Archivist Meetings: Never    Marital Status: Widowed    Tobacco Counseling Counseling given: Not Answered   Clinical Intake:  Pre-visit preparation completed: No  Pain : No/denies pain     BMI - recorded: 29.15 Nutritional Status: BMI 25 -29 Overweight Nutritional Risks: None Diabetes: No  How often do you need to have someone help you when you read instructions, pamphlets, or other written materials  from your doctor or pharmacy?: 1 - Never  Diabetic?  No  Interpreter Needed?: No  Information entered by :: Rolene Arbour LPN   Activities of Daily Living    11/14/2021    3:56 PM 06/16/2021    3:59 PM  In your present state of health, do you have any difficulty performing the following activities:  Hearing? 0 0  Vision? 0 0  Difficulty concentrating or making decisions? 0 0  Walking or climbing stairs? 0 0  Dressing or bathing? 0 0  Doing errands, shopping? 0 0  Preparing Food and eating ? N N  Using the Toilet? N N  In the past six months, have you accidently leaked urine? N Y  Do you have problems with loss of bowel control? N N  Managing your Medications? N N  Managing your Finances? N N  Housekeeping or managing your Housekeeping? N N    Patient Care Team: [place], Betty G, MD as PCP - General (Family Medicine) Evans Lance, MD as PCP - Cardiology (Cardiology) Evans Lance, MD as PCP - Electrophysiology (Cardiology) Frann Rider, NP as Nurse Practitioner (Neurology) Garvin Fila, MD as Consulting Physician (Neurology)  Indicate any recent Medical Services you may have received from other than [facility] providers in the past year (date may be approximate).     Assessment:   This is a  routine wellness examination for April Lowery.  Hearing/Vision screen Hearing Screening - Comments:: No hearing difficulty Vision Screening - Comments:: Wears glasses followed by [facility]  Dietary issues and exercise activities discussed: Exercise limited by: None identified   Goals Addressed               This Visit's Progress     No current goals (pt-stated)         Depression Screen    11/14/2021    3:54 PM 07/30/2021    8:31 AM 06/18/2021    7:20 AM 10/06/2020    8:24 AM 06/27/2020    1:50 PM 04/06/2020    6:57 PM 12/01/2019    5:58 PM  PHQ 2/9 Scores  PHQ - 2 Score 0 0 0 0 0 0 0    Fall Risk    11/14/2021    3:57 PM 07/30/2021    8:31 AM 07/26/2021    7:10 PM 06/16/2021    3:59 PM 10/06/2020    8:23 AM  [name] in the past year? 0 0 0 0 0  Number falls in past yr: 0 0  0 0  Injury with Fall? 0 0  0 0  Risk for fall due to : No Fall Risks No Fall Risks     Follow up  Falls evaluation completed   Falls evaluation completed    [place]:  Any stairs in or around the home? Yes  If so, are there any without handrails? No  Home free of loose throw rugs in walkways, pet beds, electrical cords, etc? Yes  Adequate lighting in your home to reduce risk of falls? Yes   ASSISTIVE DEVICES UTILIZED TO PREVENT FALLS:  Life alert? No  Use of a cane, walker or w/c? Yes  Grab bars in the bathroom? Yes  Shower chair or bench in shower? Yes  Elevated toilet seat or a handicapped toilet? No   TIMED UP AND GO:  Was the test performed? No . Audio Visit   Cognitive Function:  11/14/2021    3:58 PM 10/06/2019    3:02 PM  6CIT Screen  What Year? 0 points 0 points  What month? 0 points 0 points  What time? 0 points 0 points  Count back from 20 0 points 0 points  Months in reverse 0 points 0 points  Repeat phrase 0 points 0 points  Total Score 0 points 0 points    Immunizations Immunization History  Administered Date(s)  Administered   PFIZER(Purple Top)SARS-COV-2 Vaccination 07/05/2019, 07/27/2019   Pneumococcal Conjugate-13 12/23/2012      Flu Vaccine status: Declined, Education has been provided regarding the importance of this vaccine but patient still declined. Advised may receive this vaccine at local pharmacy or Health Dept. Aware to provide a copy of the vaccination record if obtained from local pharmacy or Health Dept. Verbalized acceptance and understanding.  Pneumococcal vaccine status: Declined,  Education has been provided regarding the importance of this vaccine but patient still declined. Advised may receive this vaccine at local pharmacy or Health Dept. Aware to provide a copy of the vaccination record if obtained from local pharmacy or Health Dept. Verbalized acceptance and understanding.   Covid-19 vaccine status: Declined, Education has been provided regarding the importance of this vaccine but patient still declined. Advised may receive this vaccine at local pharmacy or Health Dept.or vaccine clinic. Aware to provide a copy of the vaccination record if obtained from local pharmacy or Health Dept. Verbalized acceptance and understanding.  Qualifies for Shingles Vaccine? Yes   Zostavax completed No   Shingrix Completed?: No.    Education has been provided regarding the importance of this vaccine. Patient has been advised to call insurance company to determine out of pocket expense if they have not yet received this vaccine. Advised may also receive vaccine at local pharmacy or Health Dept. Verbalized acceptance and understanding.  Screening Tests Health Maintenance  Topic Date Due   INFLUENZA VACCINE  10/23/2021   COVID-19 Vaccine (3 - Pfizer series) 11/30/2021 (Originally 09/21/2019)   Zoster Vaccines- Shingrix (1 of 2) 02/14/2022 (Originally 07/30/1997)   Pneumonia Vaccine 37+ Years old (2 - PPSV23 or PCV20) 11/15/2022 (Originally 12/23/2013)   HEMOGLOBIN A1C  12/19/2021   OPHTHALMOLOGY EXAM   06/10/2022   FOOT EXAM  06/19/2022   COLONOSCOPY (Pts 45-52yr Insurance coverage will need to be confirmed)  09/24/2024   Hepatitis C Screening  Completed   HPV VACCINES  Aged Out   MAMMOGRAM  Discontinued   DEXA SCAN  Discontinued   TETANUS/TDAP  Discontinued    Health Maintenance  Health Maintenance Due  Topic Date Due   INFLUENZA VACCINE  10/23/2021    Colorectal cancer screening: Type of screening: Colonoscopy. Completed 09/25/14. Repeat every 10 years      Lung Cancer Screening: (Low Dose CT Chest recommended if Age 74-80years, 30 pack-year currently smoking OR have quit w/in 15years.) does not qualify.     Additional Screening:  Hepatitis C Screening: does qualify; Completed 04/03/20  Vision Screening: Recommended annual ophthalmology exams for early detection of glaucoma and other disorders of the eye. Is the patient up to date with their annual eye exam?  Yes  Who is the provider or what is the name of the office in which the patient attends annual eye exams? [facility] If pt is not established with a provider, would they like to be referred to a provider to establish care? No .   Dental Screening: Recommended annual dental exams for proper oral hygiene  Community Resource Referral / Chronic Care Management:   CRR required this visit?  No   CCM required this visit?  No      Plan:     I have personally reviewed and noted the following in the patient's chart:   Medical and social history Use of alcohol, tobacco or illicit drugs  Current medications and supplements including opioid prescriptions. Patient is not currently taking opioid prescriptions. Functional ability and status Nutritional status Physical activity Advanced directives List of other physicians Hospitalizations, surgeries, and ER visits in previous 12 months Vitals Screenings to include cognitive, depression, and falls Referrals and appointments  In addition, I have reviewed and  discussed with patient certain preventive protocols, quality metrics, and best practice recommendations. A written personalized care plan for preventive services as well as general preventive health recommendations were provided to patient.     Criselda Peaches, LPN   [id number]   Nurse Notes: None

## 2021-11-20 ENCOUNTER — Other Ambulatory Visit: Payer: Self-pay | Admitting: Family Medicine

## 2021-11-20 DIAGNOSIS — M810 Age-related osteoporosis without current pathological fracture: Secondary | ICD-10-CM | POA: Diagnosis not present

## 2021-11-20 DIAGNOSIS — E559 Vitamin D deficiency, unspecified: Secondary | ICD-10-CM | POA: Diagnosis not present

## 2021-11-20 DIAGNOSIS — E1169 Type 2 diabetes mellitus with other specified complication: Secondary | ICD-10-CM | POA: Diagnosis not present

## 2021-11-20 DIAGNOSIS — J449 Chronic obstructive pulmonary disease, unspecified: Secondary | ICD-10-CM | POA: Diagnosis not present

## 2021-11-20 DIAGNOSIS — E785 Hyperlipidemia, unspecified: Secondary | ICD-10-CM | POA: Diagnosis not present

## 2021-11-20 DIAGNOSIS — R200 Anesthesia of skin: Secondary | ICD-10-CM | POA: Diagnosis not present

## 2021-11-20 DIAGNOSIS — E118 Type 2 diabetes mellitus with unspecified complications: Secondary | ICD-10-CM | POA: Diagnosis not present

## 2021-11-20 DIAGNOSIS — I5089 Other heart failure: Secondary | ICD-10-CM | POA: Diagnosis not present

## 2021-11-20 DIAGNOSIS — Z6830 Body mass index (BMI) 30.0-30.9, adult: Secondary | ICD-10-CM | POA: Diagnosis not present

## 2021-11-23 DIAGNOSIS — E1169 Type 2 diabetes mellitus with other specified complication: Secondary | ICD-10-CM | POA: Diagnosis not present

## 2021-11-23 DIAGNOSIS — E559 Vitamin D deficiency, unspecified: Secondary | ICD-10-CM | POA: Diagnosis not present

## 2021-11-23 DIAGNOSIS — E785 Hyperlipidemia, unspecified: Secondary | ICD-10-CM | POA: Diagnosis not present

## 2021-11-23 DIAGNOSIS — E118 Type 2 diabetes mellitus with unspecified complications: Secondary | ICD-10-CM | POA: Diagnosis not present

## 2021-12-05 ENCOUNTER — Ambulatory Visit (INDEPENDENT_AMBULATORY_CARE_PROVIDER_SITE_OTHER): Payer: Medicare HMO

## 2021-12-05 DIAGNOSIS — I255 Ischemic cardiomyopathy: Secondary | ICD-10-CM

## 2021-12-06 LAB — CUP PACEART REMOTE DEVICE CHECK
Battery Remaining Longevity: 126 mo
Battery Voltage: 3.01 V
Brady Statistic AP VP Percent: 25.86 %
Brady Statistic AP VS Percent: 35.27 %
Brady Statistic AS VP Percent: 11.91 %
Brady Statistic AS VS Percent: 26.96 %
Brady Statistic RA Percent Paced: 61.14 %
Brady Statistic RV Percent Paced: 37.77 %
Date Time Interrogation Session: 20230913065354
Implantable Lead Implant Date: 20191121
Implantable Lead Implant Date: 20191121
Implantable Lead Location: 753859
Implantable Lead Location: 753860
Implantable Lead Model: 4076
Implantable Lead Model: 4076
Implantable Pulse Generator Implant Date: 20191121
Lead Channel Impedance Value: 342 Ohm
Lead Channel Impedance Value: 380 Ohm
Lead Channel Impedance Value: 418 Ohm
Lead Channel Impedance Value: 456 Ohm
Lead Channel Pacing Threshold Amplitude: 0.625 V
Lead Channel Pacing Threshold Amplitude: 0.875 V
Lead Channel Pacing Threshold Pulse Width: 0.4 ms
Lead Channel Pacing Threshold Pulse Width: 0.4 ms
Lead Channel Sensing Intrinsic Amplitude: 2.5 mV
Lead Channel Sensing Intrinsic Amplitude: 2.5 mV
Lead Channel Sensing Intrinsic Amplitude: 23.125 mV
Lead Channel Sensing Intrinsic Amplitude: 23.125 mV
Lead Channel Setting Pacing Amplitude: 1.5 V
Lead Channel Setting Pacing Amplitude: 2 V
Lead Channel Setting Pacing Pulse Width: 0.4 ms
Lead Channel Setting Sensing Sensitivity: 1.2 mV

## 2021-12-20 NOTE — Progress Notes (Signed)
Remote pacemaker transmission.   

## 2022-01-18 DIAGNOSIS — E1165 Type 2 diabetes mellitus with hyperglycemia: Secondary | ICD-10-CM | POA: Diagnosis not present

## 2022-01-18 DIAGNOSIS — Z880 Allergy status to penicillin: Secondary | ICD-10-CM | POA: Diagnosis not present

## 2022-01-18 DIAGNOSIS — E7800 Pure hypercholesterolemia, unspecified: Secondary | ICD-10-CM | POA: Diagnosis not present

## 2022-01-18 DIAGNOSIS — Z87891 Personal history of nicotine dependence: Secondary | ICD-10-CM | POA: Diagnosis not present

## 2022-01-18 DIAGNOSIS — I2510 Atherosclerotic heart disease of native coronary artery without angina pectoris: Secondary | ICD-10-CM | POA: Diagnosis not present

## 2022-01-18 DIAGNOSIS — I10 Essential (primary) hypertension: Secondary | ICD-10-CM | POA: Diagnosis not present

## 2022-01-18 DIAGNOSIS — N3000 Acute cystitis without hematuria: Secondary | ICD-10-CM | POA: Diagnosis not present

## 2022-01-31 DIAGNOSIS — Z950 Presence of cardiac pacemaker: Secondary | ICD-10-CM | POA: Diagnosis not present

## 2022-01-31 DIAGNOSIS — N183 Chronic kidney disease, stage 3 unspecified: Secondary | ICD-10-CM | POA: Diagnosis not present

## 2022-01-31 DIAGNOSIS — E1169 Type 2 diabetes mellitus with other specified complication: Secondary | ICD-10-CM | POA: Diagnosis not present

## 2022-01-31 DIAGNOSIS — E785 Hyperlipidemia, unspecified: Secondary | ICD-10-CM | POA: Diagnosis not present

## 2022-01-31 DIAGNOSIS — Z951 Presence of aortocoronary bypass graft: Secondary | ICD-10-CM | POA: Diagnosis not present

## 2022-01-31 DIAGNOSIS — Z6827 Body mass index (BMI) 27.0-27.9, adult: Secondary | ICD-10-CM | POA: Diagnosis not present

## 2022-01-31 DIAGNOSIS — E118 Type 2 diabetes mellitus with unspecified complications: Secondary | ICD-10-CM | POA: Diagnosis not present

## 2022-01-31 DIAGNOSIS — R300 Dysuria: Secondary | ICD-10-CM | POA: Diagnosis not present

## 2022-01-31 DIAGNOSIS — Z789 Other specified health status: Secondary | ICD-10-CM | POA: Diagnosis not present

## 2022-02-13 ENCOUNTER — Other Ambulatory Visit: Payer: Self-pay | Admitting: Family Medicine

## 2022-02-20 ENCOUNTER — Other Ambulatory Visit: Payer: Self-pay | Admitting: Family Medicine

## 2022-02-26 DIAGNOSIS — E789 Disorder of lipoprotein metabolism, unspecified: Secondary | ICD-10-CM | POA: Diagnosis not present

## 2022-02-26 DIAGNOSIS — I499 Cardiac arrhythmia, unspecified: Secondary | ICD-10-CM | POA: Diagnosis not present

## 2022-02-26 DIAGNOSIS — J449 Chronic obstructive pulmonary disease, unspecified: Secondary | ICD-10-CM | POA: Diagnosis not present

## 2022-02-26 DIAGNOSIS — Z86711 Personal history of pulmonary embolism: Secondary | ICD-10-CM | POA: Diagnosis not present

## 2022-02-26 DIAGNOSIS — I119 Hypertensive heart disease without heart failure: Secondary | ICD-10-CM | POA: Diagnosis not present

## 2022-02-26 DIAGNOSIS — I429 Cardiomyopathy, unspecified: Secondary | ICD-10-CM | POA: Diagnosis not present

## 2022-02-26 DIAGNOSIS — Z950 Presence of cardiac pacemaker: Secondary | ICD-10-CM | POA: Diagnosis not present

## 2022-02-26 DIAGNOSIS — Z8673 Personal history of transient ischemic attack (TIA), and cerebral infarction without residual deficits: Secondary | ICD-10-CM | POA: Diagnosis not present

## 2022-02-26 DIAGNOSIS — I2510 Atherosclerotic heart disease of native coronary artery without angina pectoris: Secondary | ICD-10-CM | POA: Diagnosis not present

## 2022-02-26 DIAGNOSIS — I509 Heart failure, unspecified: Secondary | ICD-10-CM | POA: Diagnosis not present

## 2022-02-26 DIAGNOSIS — E119 Type 2 diabetes mellitus without complications: Secondary | ICD-10-CM | POA: Diagnosis not present

## 2022-02-26 DIAGNOSIS — Z951 Presence of aortocoronary bypass graft: Secondary | ICD-10-CM | POA: Diagnosis not present

## 2022-02-26 DIAGNOSIS — I10 Essential (primary) hypertension: Secondary | ICD-10-CM | POA: Diagnosis not present

## 2022-02-26 DIAGNOSIS — I252 Old myocardial infarction: Secondary | ICD-10-CM | POA: Diagnosis not present

## 2022-03-06 ENCOUNTER — Ambulatory Visit (INDEPENDENT_AMBULATORY_CARE_PROVIDER_SITE_OTHER): Payer: Medicare HMO

## 2022-03-06 DIAGNOSIS — I255 Ischemic cardiomyopathy: Secondary | ICD-10-CM | POA: Diagnosis not present

## 2022-03-06 DIAGNOSIS — R9431 Abnormal electrocardiogram [ECG] [EKG]: Secondary | ICD-10-CM | POA: Diagnosis not present

## 2022-03-06 LAB — CUP PACEART REMOTE DEVICE CHECK
Battery Remaining Longevity: 123 mo
Battery Voltage: 3.01 V
Brady Statistic AP VP Percent: 16.08 %
Brady Statistic AP VS Percent: 34.38 %
Brady Statistic AS VP Percent: 7.17 %
Brady Statistic AS VS Percent: 42.37 %
Brady Statistic RA Percent Paced: 50.45 %
Brady Statistic RV Percent Paced: 23.25 %
Date Time Interrogation Session: 20231212222649
Implantable Lead Connection Status: 753985
Implantable Lead Connection Status: 753985
Implantable Lead Implant Date: 20191121
Implantable Lead Implant Date: 20191121
Implantable Lead Location: 753859
Implantable Lead Location: 753860
Implantable Lead Model: 4076
Implantable Lead Model: 4076
Implantable Pulse Generator Implant Date: 20191121
Lead Channel Impedance Value: 342 Ohm
Lead Channel Impedance Value: 380 Ohm
Lead Channel Impedance Value: 418 Ohm
Lead Channel Impedance Value: 437 Ohm
Lead Channel Pacing Threshold Amplitude: 0.625 V
Lead Channel Pacing Threshold Amplitude: 1 V
Lead Channel Pacing Threshold Pulse Width: 0.4 ms
Lead Channel Pacing Threshold Pulse Width: 0.4 ms
Lead Channel Sensing Intrinsic Amplitude: 2.75 mV
Lead Channel Sensing Intrinsic Amplitude: 2.75 mV
Lead Channel Sensing Intrinsic Amplitude: 28.875 mV
Lead Channel Sensing Intrinsic Amplitude: 28.875 mV
Lead Channel Setting Pacing Amplitude: 1.5 V
Lead Channel Setting Pacing Amplitude: 2 V
Lead Channel Setting Pacing Pulse Width: 0.4 ms
Lead Channel Setting Sensing Sensitivity: 1.2 mV
Zone Setting Status: 755011
Zone Setting Status: 755011

## 2022-04-02 NOTE — Progress Notes (Signed)
Remote pacemaker transmission.   

## 2022-04-24 ENCOUNTER — Other Ambulatory Visit: Payer: Self-pay | Admitting: Family Medicine

## 2022-05-01 ENCOUNTER — Ambulatory Visit (INDEPENDENT_AMBULATORY_CARE_PROVIDER_SITE_OTHER): Payer: Medicare HMO | Admitting: Family Medicine

## 2022-05-01 ENCOUNTER — Encounter: Payer: Self-pay | Admitting: Family Medicine

## 2022-05-01 VITALS — BP 118/60 | HR 67 | Temp 98.0°F | Resp 16 | Ht 65.0 in | Wt 168.2 lb

## 2022-05-01 DIAGNOSIS — N1831 Chronic kidney disease, stage 3a: Secondary | ICD-10-CM

## 2022-05-01 DIAGNOSIS — I10 Essential (primary) hypertension: Secondary | ICD-10-CM | POA: Diagnosis not present

## 2022-05-01 DIAGNOSIS — I5020 Unspecified systolic (congestive) heart failure: Secondary | ICD-10-CM

## 2022-05-01 DIAGNOSIS — N183 Chronic kidney disease, stage 3 unspecified: Secondary | ICD-10-CM | POA: Insufficient documentation

## 2022-05-01 DIAGNOSIS — M5441 Lumbago with sciatica, right side: Secondary | ICD-10-CM

## 2022-05-01 DIAGNOSIS — I442 Atrioventricular block, complete: Secondary | ICD-10-CM | POA: Diagnosis not present

## 2022-05-01 DIAGNOSIS — E1169 Type 2 diabetes mellitus with other specified complication: Secondary | ICD-10-CM | POA: Diagnosis not present

## 2022-05-01 LAB — HEMOGLOBIN A1C: Hgb A1c MFr Bld: 6.8 % — ABNORMAL HIGH (ref 4.6–6.5)

## 2022-05-01 LAB — BASIC METABOLIC PANEL
BUN: 15 mg/dL (ref 6–23)
CO2: 26 mEq/L (ref 19–32)
Calcium: 9.8 mg/dL (ref 8.4–10.5)
Chloride: 103 mEq/L (ref 96–112)
Creatinine, Ser: 0.98 mg/dL (ref 0.40–1.20)
GFR: 56.76 mL/min — ABNORMAL LOW (ref >60.00)
Glucose, Bld: 118 mg/dL — ABNORMAL HIGH (ref 70–99)
Potassium: 4.3 mEq/L (ref 3.5–5.1)
Sodium: 137 mEq/L (ref 135–145)

## 2022-05-01 LAB — MICROALBUMIN / CREATININE URINE RATIO
Creatinine,U: 52.9 mg/dL
Microalb Creat Ratio: 1.3 mg/g (ref 0.0–30.0)
Microalb, Ur: <0.7 mg/dL (ref 0.0–1.9)

## 2022-05-01 MED ORDER — DICLOFENAC SODIUM 1 % EX GEL: 2.0000 g | Freq: Four times a day (QID) | CUTANEOUS | 1 refills | Status: DC

## 2022-05-01 NOTE — Progress Notes (Unsigned)
ACUTE VISIT Chief Complaint  Patient presents with  . Back Pain    Started on Sunday after coming home from a 13 hour plane ride. Started on the left side but now it is the right side of her back and goes around to the front side.    HPI: April Lowery is a 77 y.o. female with PMHx significant for DM 2, CVA, CAD,HFrEF,HLD, and complete heart block status post pacemaker placement   here today complaining of *** Back Pain    Diabetes Mellitus II:  - Checking BG at home: *** - Medications: *** - Compliance: *** - Diet: *** - Exercise: *** - eye exam: *** - foot exam: *** - microalbumin: *** - Negative for symptoms of hypoglycemia, polyuria, polydipsia, numbness extremities, foot ulcers/trauma  Lab Results  Component Value Date   HGBA1C 7.2 (H) 06/18/2021   Lab Results  Component Value Date   MICROALBUR <0.7 06/18/2021   Lab Results  Component Value Date   CREATININE 1.05 (H) 08/15/2021   BUN 13 08/15/2021   NA 140 08/15/2021   K 3.9 08/15/2021   CL 101 08/15/2021   CO2 25 08/15/2021   Review of Systems  Musculoskeletal:  Positive for back pain.   See other pertinent positives and negatives in HPI.  Current Outpatient Medications on File Prior to Visit  Medication Sig Dispense Refill  . acetaminophen (TYLENOL) 650 MG CR tablet Take 650 mg by mouth every 8 (eight) hours as needed for pain.    Marland Kitchen aspirin EC 81 MG tablet Take 81 mg by mouth daily. Swallow whole.    . cholecalciferol (VITAMIN D3) 25 MCG (1000 UT) tablet Take 1,000 Units by mouth daily.    . clopidogrel (PLAVIX) 75 MG tablet Take 1 tablet (75 mg total) by mouth daily. 90 tablet 3  . Cyanocobalamin (B-12) 2500 MCG TABS Take 1 tablet by mouth daily.    Marland Kitchen ezetimibe (ZETIA) 10 MG tablet TAKE 1 TABLET BY MOUTH EVERY DAY 90 tablet 3  . furosemide (LASIX) 20 MG tablet TAKE 2 TABLETS BY MOUTH DAILY FOR 5 DAYS THEN 1 TABLET DAILY 90 tablet 1  . JARDIANCE 10 MG TABS tablet TAKE 1 TABLET BY MOUTH EVERY DAY  BEFORE BREAKFAST 30 tablet 2  . metoprolol succinate (TOPROL-XL) 50 MG 24 hr tablet TAKE 1 TABLET BY MOUTH EVERY DAY WITH OR IMMEDIATELY FOLLOWING A MEAL 90 tablet 1  . pantoprazole (PROTONIX) 40 MG tablet Take 1 tablet (40 mg total) by mouth daily. 90 tablet 3  . rosuvastatin (CRESTOR) 40 MG tablet TAKE 1 TABLET BY MOUTH EVERYDAY AT BEDTIME 90 tablet 3  . sacubitril-valsartan (ENTRESTO) 24-26 MG Take 1 tablet by mouth 2 (two) times daily. 180 tablet 2  . sertraline (ZOLOFT) 50 MG tablet TAKE 1 TABLET BY MOUTH EVERY DAY 90 tablet 1   No current facility-administered medications on file prior to visit.    Past Medical History:  Diagnosis Date  . Acute ST segment elevation myocardial infarction ([place])   . Arthritis   . CAD (coronary artery disease) 02/15/2019  . Cancer of left kidney ([place])   . Complete atrioventricular block (HCC)   . DVT (deep venous thrombosis) ([place]) 02/15/2019  . GI bleeding   . 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 HLD (hyperlipidemia) 02/15/2019  . HTN (hypertension) 02/15/2019  . Ischemic cardiomyopathy   . Melena   . Pacemaker   . Pulmonary thromboembolism ([place]) 02/15/2019   Allergies  Allergen Reactions  . Penicillins  Rash    Social History   Socioeconomic History  . Marital status: Widowed    Spouse name: Not on file  . Number of children: 4  . Years of education: Not on file  . Highest education level: 12th grade  Occupational History  . Occupation: retired  Tobacco Use  . Smoking status: Former  . Smokeless tobacco: Never  Vaping Use  . Vaping Use: Never used  Substance and Sexual Activity  . Alcohol use: Never  . Drug use: Never  . Sexual activity: Not on file    Comment: MARRIED  Other Topics Concern  . Not on file  Social History Narrative  . Not on file   Social Determinants of Health   Financial Resource Strain: Low Risk  (11/14/2021)   Overall Financial Resource Strain (CARDIA)   . Difficulty of Paying Living Expenses:  Not hard at all  Food Insecurity: No Food Insecurity (11/14/2021)   Hunger Vital Sign   . Worried About Charity fundraiser in the Last Year: Never true   . Ran Out of Food in the Last Year: Never true  Transportation Needs: No Transportation Needs (11/14/2021)   PRAPARE - Transportation   . Lack of Transportation (Medical): No   . Lack of Transportation (Non-Medical): No  Physical Activity: Inactive (11/14/2021)   Exercise Vital Sign   . Days of Exercise per Week: 0 days   . Minutes of Exercise per Session: 20 min  Stress: No Stress Concern Present (11/14/2021)   [place]   . Feeling of Stress : Not at all  Social Connections: Socially Isolated (11/14/2021)   Social Connection and Isolation Panel [NHANES]   . Frequency of Communication with Friends and Family: More than three times a week   . Frequency of Social Gatherings with Friends and Family: More than three times a week   . Attends Religious Services: Never   . Active Member of Clubs or Organizations: No   . Attends Archivist Meetings: Never   . Marital Status: Widowed    Vitals:   05/01/22 1350  BP: 118/60  Pulse: 67  Temp: 98 F (36.7 C)  SpO2: 95%   Body mass index is 28 kg/m.  Physical Exam Vitals and nursing note reviewed.  Constitutional:      General: She is not in acute distress.    Appearance: She is well-developed.  HENT:     Head: Normocephalic and atraumatic.  Eyes:     Conjunctiva/sclera: Conjunctivae normal.  Neck:     Vascular: No JVD.  Cardiovascular:     Rate and Rhythm: Normal rate and regular rhythm.     Heart sounds: No murmur heard.    Comments: PT pulses palpable. Pulmonary:     Effort: Pulmonary effort is normal. No respiratory distress.     Breath sounds: Normal breath sounds.  Abdominal:     Palpations: Abdomen is soft. There is no hepatomegaly or mass.     Tenderness: There is no abdominal tenderness.   Musculoskeletal:     Lumbar back: Tenderness present. Negative right straight leg raise test and negative left straight leg raise test.       Back:  Lymphadenopathy:     Cervical: No cervical adenopathy.  Skin:    General: Skin is warm.     Findings: No erythema or rash.  Neurological:     Mental Status: She is alert and oriented to person, place, and  time.     Comments: Antalgic gait, not assisted.  Psychiatric:     Comments: Well groomed, good eye contact.   ASSESSMENT AND PLAN: Isamar was seen today for back pain.  Diagnoses and all orders for this visit:  Stage 3a chronic kidney disease ([place])  Type 2 diabetes mellitus with other specified complication, without long-term current use of insulin (HCC) -     Microalbumin / creatinine urine ratio; Future -     Basic metabolic panel; Future -     Hemoglobin A1c; Future  Acute right-sided low back pain with right-sided sciatica  Essential (primary) hypertension  Other orders -     diclofenac Sodium (VOLTAREN) 1 % GEL; Apply 2 g topically 4 (four) times daily.    Orders Placed This Encounter  Procedures  . Microalbumin / creatinine urine ratio  . Basic metabolic panel  . Hemoglobin A1c    No problem-specific Assessment & Plan notes found for this encounter.   Joram Venson G. [place], MD [facility]. [place] office.   No follow-ups on file.  Geoffery Aultman G. Martinique, MD  [facility]. [place] office.  Discharge Instructions   None

## 2022-05-01 NOTE — Patient Instructions (Addendum)
A few things to remember from today's visit: Type 2 diabetes mellitus with other specified complication, without long-term current use of insulin ([place]), Chronic - Plan: Microalbumin / creatinine urine ratio, Basic metabolic panel, Hemoglobin A1c  Acute right-sided low back pain with right-sided sciatica  Essential (primary) hypertension  Kidney function has been stable. Continue adequate hydration and avoiding medications like Ibuprofen and naproxen. Tylenol 500 mg 3-4 times per day. Local heat. Voltaren gel may also help.  If you need refills for medications you take chronically, please call your pharmacy. Do not use My Chart to request refills or for acute issues that need immediate attention. If you send a my chart message, it may take a few days to be addressed, specially if I am not in the office.  Please be sure medication list is accurate. If a new problem present, please set up appointment sooner than planned today.

## 2022-05-02 MED ORDER — EMPAGLIFLOZIN 10 MG PO TABS: ORAL_TABLET | ORAL | 2 refills | Status: DC

## 2022-05-02 NOTE — Assessment & Plan Note (Signed)
We discussed treatment options, may benefit from course of Prednisone, we discussed some side effects, she prefers not to take new medications at this time. Continue Tylenol 500 mg 3-4 times per day as needed. Topical voltaren gel on affected area qid prn.

## 2022-05-02 NOTE — Assessment & Plan Note (Signed)
Problem has been stable, e GFR 53-59 and Cr 1.0-1.1. I do not think she needs to see nephrologist at this time and as far as problem continues being stable but we can certainly arrange a consultation if there is a concerned; she agrees with waiting on this for now. Now that she is not on Entresto we could consider adding a ACEI or ARB in the future. Continue adequate hydration,low salt diet,avoidance of NSAID's,and adequate BP and glucose controlled.

## 2022-05-02 NOTE — Assessment & Plan Note (Signed)
Euvolemic. She is not longer on Entresto, could not afford it. Continue metoprolol succinate 50 mg daily, Jardiance 10 mg daily, and furosemide 20 mg daily, as well as low-salt. Follow-up with cardiologist due in 05/2022.

## 2022-05-02 NOTE — Assessment & Plan Note (Signed)
S/P pacemaker placement. Follows with cardiologist.

## 2022-05-02 NOTE — Assessment & Plan Note (Signed)
BP is adequately controlled. Continue metoprolol succinate 50 mg daily and low-salt diet. Monitor BP at home.

## 2022-05-17 ENCOUNTER — Encounter: Payer: Self-pay | Admitting: Family Medicine

## 2022-05-17 ENCOUNTER — Ambulatory Visit (INDEPENDENT_AMBULATORY_CARE_PROVIDER_SITE_OTHER): Payer: Medicare HMO | Admitting: Family Medicine

## 2022-05-17 VITALS — BP 128/60 | HR 90 | Resp 16 | Ht 65.0 in | Wt 171.0 lb

## 2022-05-17 DIAGNOSIS — L989 Disorder of the skin and subcutaneous tissue, unspecified: Secondary | ICD-10-CM

## 2022-05-17 DIAGNOSIS — R519 Headache, unspecified: Secondary | ICD-10-CM | POA: Diagnosis not present

## 2022-05-17 DIAGNOSIS — M25542 Pain in joints of left hand: Secondary | ICD-10-CM

## 2022-05-17 DIAGNOSIS — T148XXA Other injury of unspecified body region, initial encounter: Secondary | ICD-10-CM

## 2022-05-17 DIAGNOSIS — R1030 Lower abdominal pain, unspecified: Secondary | ICD-10-CM

## 2022-05-17 NOTE — Progress Notes (Unsigned)
ACUTE VISIT Chief Complaint  Patient presents with   Abdominal Pain   Headache   HPI: [name] is a 77 y.o. female, who is here today complaining of *** HPI Wed am. A months of left MCP jint pain, thumb. No Tx.  Review of Systems See other pertinent positives and negatives in HPI.  Current Outpatient Medications on File Prior to Visit  Medication Sig Dispense Refill   acetaminophen (TYLENOL) 650 MG CR tablet Take 650 mg by mouth every 8 (eight) hours as needed for pain.     aspirin EC 81 MG tablet Take 81 mg by mouth daily. Swallow whole.     cholecalciferol (VITAMIN D3) 25 MCG (1000 UT) tablet Take 1,000 Units by mouth daily.     clopidogrel (PLAVIX) 75 MG tablet Take 1 tablet (75 mg total) by mouth daily. 90 tablet 3   Cyanocobalamin (B-12) 2500 MCG TABS Take 1 tablet by mouth daily.     diclofenac Sodium (VOLTAREN) 1 % GEL Apply 2 g topically 4 (four) times daily. 150 g 1   empagliflozin (JARDIANCE) 10 MG TABS tablet TAKE 1 TABLET BY MOUTH EVERY DAY BEFORE BREAKFAST 90 tablet 2   ezetimibe (ZETIA) 10 MG tablet TAKE 1 TABLET BY MOUTH EVERY DAY 90 tablet 3   furosemide (LASIX) 20 MG tablet TAKE 2 TABLETS BY MOUTH DAILY FOR 5 DAYS THEN 1 TABLET DAILY 90 tablet 1   metoprolol succinate (TOPROL-XL) 50 MG 24 hr tablet TAKE 1 TABLET BY MOUTH EVERY DAY WITH OR IMMEDIATELY FOLLOWING A MEAL 90 tablet 1   pantoprazole (PROTONIX) 40 MG tablet Take 1 tablet (40 mg total) by mouth daily. 90 tablet 3   rosuvastatin (CRESTOR) 40 MG tablet TAKE 1 TABLET BY MOUTH EVERYDAY AT BEDTIME 90 tablet 3   sacubitril-valsartan (ENTRESTO) 24-26 MG Take 1 tablet by mouth 2 (two) times daily. 180 tablet 2   sertraline (ZOLOFT) 50 MG tablet TAKE 1 TABLET BY MOUTH EVERY DAY 90 tablet 1   No current facility-administered medications on file prior to visit.   Past Medical History:  Diagnosis Date   Acute ST segment elevation myocardial infarction [facility])    Arthritis    CAD (coronary artery disease)  02/15/2019   Cancer of left kidney (HCC)    Complete atrioventricular block (HCC)    DVT (deep venous thrombosis) ([place]) 02/15/2019   GI bleeding    History of cerebellar stroke 02/15/2019   HLD (hyperlipidemia) 02/15/2019   HTN (hypertension) 02/15/2019   Ischemic cardiomyopathy    Melena    Pacemaker    Pulmonary thromboembolism ([place]) 02/15/2019   Allergies  Allergen Reactions   Penicillins Rash   Social History   Socioeconomic History   Marital status: Widowed    Spouse name: Not on file   Number of children: 4   Years of education: Not on file   Highest education level: 12th grade  Occupational History   Occupation: retired  Tobacco Use   Smoking status: Former   Smokeless tobacco: Never  Scientific laboratory technician Use: Never used  Substance and Sexual Activity   Alcohol use: Never   Drug use: Never   Sexual activity: Not on file    Comment: MARRIED  Other Topics Concern   Not on file  Social History Narrative   Not on file   Social Determinants of Health   Financial Resource Strain: Low Risk  (11/14/2021)   Overall Financial Resource Strain (CARDIA)    Difficulty of  Paying Living Expenses: Not hard at all  Food Insecurity: No Food Insecurity (11/14/2021)   Hunger Vital Sign    Worried About Running Out of Food in the Last Year: Never true    Ran Out of Food in the Last Year: Never true  Transportation Needs: No Transportation Needs (11/14/2021)   PRAPARE - Hydrologist (Medical): No    Lack of Transportation (Non-Medical): No  Physical Activity: Inactive (11/14/2021)   Exercise Vital Sign    Days of Exercise per Week: 0 days    Minutes of Exercise per Session: 20 min  Stress: No Stress Concern Present (11/14/2021)   [place]    Feeling of Stress : Not at all  Social Connections: Socially Isolated (11/14/2021)   Social Connection and Isolation Panel [NHANES]     Frequency of Communication with Friends and Family: More than three times a week    Frequency of Social Gatherings with Friends and Family: More than three times a week    Attends Religious Services: Never    Marine scientist or Organizations: No    Attends Archivist Meetings: Never    Marital Status: Widowed   Vitals:   05/17/22 1458  BP: 128/60  Pulse: 90  Resp: 16  SpO2: 96%   Body mass index is 28.46 kg/m.  Physical Exam Vitals and nursing note reviewed.  Constitutional:      General: She is not in acute distress.    Appearance: She is well-developed.  HENT:     Head: Normocephalic and atraumatic.      Mouth/Throat:     Mouth: Mucous membranes are moist.     Pharynx: Oropharynx is clear.  Eyes:     General: Scleral icterus present.     Conjunctiva/sclera: Conjunctivae normal.  Cardiovascular:     Rate and Rhythm: Normal rate and regular rhythm.     Pulses:          Dorsalis pedis pulses are 2+ on the right side and 2+ on the left side.     Heart sounds: No murmur heard. Pulmonary:     Effort: Pulmonary effort is normal. No respiratory distress.     Breath sounds: Normal breath sounds.  Abdominal:     Palpations: Abdomen is soft. There is no hepatomegaly or mass.     Tenderness: There is abdominal tenderness (LLQ>suprapubic.) in the periumbilical area, suprapubic area and left lower quadrant. There is no guarding or rebound.  Musculoskeletal:     Left hand: Normal capillary refill. Normal pulse.     Comments: No signs of synovitis.  Pain is not  elicited on radial styloid with Finkelstein maneuver.   Lymphadenopathy:     Cervical: No cervical adenopathy.  Skin:    General: Skin is warm.     Findings: No erythema or rash.  Neurological:     General: No focal deficit present.     Mental Status: She is alert and oriented to person, place, and time.     Cranial Nerves: No cranial nerve deficit.     Gait: Gait normal.     Comments: Mildly  unstable gait, not assisted.  Psychiatric:     Comments: Well groomed, good eye contact.     ASSESSMENT AND PLAN: There are no diagnoses linked to this encounter.  No follow-ups on file.  Daffney Greenly G. [place], MD  [facility]. [place] office.  Discharge Instructions  None

## 2022-05-17 NOTE — Patient Instructions (Signed)
A few things to remember from today's visit:  Lower abdominal pain - Plan: Basic metabolic panel, CBC, Urinalysis with Culture Reflex  Non-healing non-surgical wound - Plan: Ambulatory referral to Dermatology  Headache, unspecified headache type  ? Colitis. Symptoms have improved, so continue monitoring. Adequate hydration. If pain gets suddenly worse or you start with vomiting or fever, seek immediate medical attention.  If you need refills for medications you take chronically, please call your pharmacy. Do not use My Chart to request refills or for acute issues that need immediate attention. If you send a my chart message, it may take a few days to be addressed, specially if I am not in the office.  Please be sure medication list is accurate. If a new problem present, please set up appointment sooner than planned today.

## 2022-05-18 LAB — CBC
Hematocrit: 42.2 % (ref 34.0–46.6)
Hemoglobin: 14.2 g/dL (ref 11.1–15.9)
MCH: 29.2 pg (ref 26.6–33.0)
MCHC: 33.6 g/dL (ref 31.5–35.7)
MCV: 87 fL (ref 79–97)
Platelets: 248 x10E3/uL (ref 150–450)
RBC: 4.86 x10E6/uL (ref 3.77–5.28)
RDW: 13 % (ref 11.7–15.4)
WBC: 7.6 x10E3/uL (ref 3.4–10.8)

## 2022-05-18 LAB — BASIC METABOLIC PANEL WITH GFR
BUN/Creatinine Ratio: 14 (ref 12–28)
BUN: 12 mg/dL (ref 8–27)
CO2: 24 mmol/L (ref 20–29)
Calcium: 9.8 mg/dL (ref 8.7–10.3)
Chloride: 100 mmol/L (ref 96–106)
Creatinine, Ser: 0.86 mg/dL (ref 0.57–1.00)
Glucose: 103 mg/dL — ABNORMAL HIGH (ref 70–99)
Potassium: 4.4 mmol/L (ref 3.5–5.2)
Sodium: 139 mmol/L (ref 134–144)
eGFR: 71 mL/min/1.73

## 2022-05-19 LAB — URINALYSIS W MICROSCOPIC + REFLEX CULTURE
Bacteria, UA: NONE SEEN /HPF
Bilirubin Urine: NEGATIVE
Hgb urine dipstick: NEGATIVE
Hyaline Cast: NONE SEEN /LPF
Ketones, ur: NEGATIVE
Leukocyte Esterase: NEGATIVE
Nitrites, Initial: NEGATIVE
Protein, ur: NEGATIVE
RBC / HPF: NONE SEEN /HPF (ref 0–2)
Specific Gravity, Urine: 1.019 (ref 1.001–1.035)
Squamous Epithelial / HPF: NONE SEEN /HPF (ref <=5)
pH: 5.5 (ref 5.0–8.0)

## 2022-05-19 LAB — URINE CULTURE
MICRO NUMBER:: 14608783
SPECIMEN QUALITY:: ADEQUATE

## 2022-05-19 LAB — EXTRA URINE SPECIMEN

## 2022-05-19 LAB — CULTURE INDICATED

## 2022-05-20 ENCOUNTER — Other Ambulatory Visit: Payer: Self-pay | Admitting: Family Medicine

## 2022-05-20 ENCOUNTER — Other Ambulatory Visit: Payer: Self-pay

## 2022-05-20 DIAGNOSIS — N1831 Chronic kidney disease, stage 3a: Secondary | ICD-10-CM

## 2022-05-20 DIAGNOSIS — E1169 Type 2 diabetes mellitus with other specified complication: Secondary | ICD-10-CM

## 2022-05-22 ENCOUNTER — Telehealth: Payer: Self-pay

## 2022-05-22 NOTE — Progress Notes (Signed)
Care Management & Coordination Services Pharmacy Team  Reason for Encounter: Appointment Reminder  Contacted patient to confirm in office appointment with Burman Riis, PharmD on 05/24/2022 at 9:00. Spoke with patient on 05/22/2022   Have you seen any other providers since your last visit? **Patient denies  Any changes in your medications or health? Patient denies  Any side effects from any medications? Patient denies  Do you have an symptoms or problems not managed by your medications? Patient denies  Any concerns about your health right now? Patient denies  Has your provider asked that you check blood pressure, blood sugar, or follow special diet at home? Patient denies following any specific diet, she is checking blood pressures and blood sugars occasionally.    Do you get any type of exercise on a regular basis? Patient states she walks daily  Can you think of a goal you would like to reach for your health? Patient denies  Do you have any problems getting your medications? Patient states she is not taking Entresto due to cost and has trouble getting Jardiance when she is in the donut hole.   Is there anything that you would like to discuss during the appointment? Patient denies  Patient states she has not taken Rosuvastatin for a couple years, she doesn't want to take this kind of medication, denies any side effects.    Please bring BP and BS logs, medications and supplements to appointment  Chart review:  Recent office visits:  05/17/2022 Betty Martinique MD - Patient was seen for lower abdominal pain and additional concerns. No medication changes.  05/21/2022 Betty Martinique MD - Patient was seen for Acute right-sided low back pain with right-sided sciatica and additional concerns. Started Diclofenac topical.  11/14/2021 Rolene Arbour LPN - Medicare annual wellness exam   Recent consult visits:  02/26/2022 Orpah Clinton MD (cardiology [place], [place]) - Patient was seen for  congestive heart failure with cardiomyopathy. No medication changes. PLAN: Therefore, as the patient is feeling generally stable, no changes will be made to her medical regimen. She is also receiving regular device checks from a clinic in [place]. No changes will be made in that regimen. The patient is instructed to continue her current therapy and return to this office in three to six months.   01/31/2022 Orpah Clinton MD (cardiology [place], [place]) - Patient was seen for Stage 3 chronic kidney disease, unspecified whether stage 3a or 3b CKD.  No medication changes.   11/20/2021 Orpah Clinton MD (cardiology [place], [place]) - Patient was seen for other heart failure and additional concerns.   Hospital visits:  [facility]) on 01/18/2022 due to Type 2 diabetes mellitus with hyperglycemia and additional concerns.    New?Medications Started at [facility] Discharge:?? Not noted Medication Changes at Hospital Discharge: Not noted Medications Discontinued at Hospital Discharge: Not noted Medications that remain the same after Hospital Discharge:??  -All other medications will remain the same.     Care Gaps: AWV - completed 11/14/2021, scheduled 11/19/2022 Last eye exam / retinopathy screening: 06/09/2021 Last diabetic foot exam: 06/18/2021 Tdap - never done Shingrix - never done Flu - never done Covid - overdue Pneumovax - postponed  Star Rating Drugs:  Jardiance 10 mg - last filled 03/16/2022 90 DS at [facility]) Rosuvastatin 40 mg - last filled 12/10/2019 90 DS at [place] Pharmacist Assistant [phone]

## 2022-05-23 DIAGNOSIS — H2513 Age-related nuclear cataract, bilateral: Secondary | ICD-10-CM | POA: Diagnosis not present

## 2022-05-23 NOTE — Progress Notes (Signed)
Care Management & Coordination Services Pharmacy Note  05/23/2022 Name:  April Lowery MRN:  [id number] DOB:  1945-07-14  Summary: -Not taking Delene Loll due to cost restriction, misses Jardiance also at times due to cost  -Not checking sugars at home, denies any symptoms of hypo/hyperglycemia -LDL not at goal <70; Has not taken statin in 2 years 2/2 joint pain, declines other cholesterol therapies other than zetia -DEXA past due, patient declines  Recommendations/Changes made from today's visit: -START Entresto as previously prescribed and continue Jardiance; Patient approved and can get for free at pharmacy through [place] BP daily for next 2 weeks to monitor for BP changes with starting entresto -Counseled on how to correct hypoglycemic episodes -Recommend CoQ10 with statin or switch to repatha, patient not interested at this time. Continue zetia -Recommend Calcium OTC for bone health, in addition to Vit D pt already taking  Follow up plan: BP follow up in 2 weeks Pharmacist visit in 3 months   Subjective: April Lowery is an 77 y.o. year old female who is a primary patient of Martinique, Malka So, MD.  The care coordination team was consulted for assistance with disease management and care coordination needs.    Engaged with patient by telephone for initial visit.  Recent office visits: 05/17/2022 Betty Martinique MD - Patient was seen for lower abdominal pain and additional concerns. No medication changes.   05/21/2022 Betty Martinique MD - Patient was seen for Acute right-sided low back pain with right-sided sciatica and additional concerns. Started Diclofenac topical.   11/14/2021 Rolene Arbour LPN - Medicare annual wellness exam  Recent consult visits: 02/26/2022 Orpah Clinton MD (cardiology [place], [place]) - Patient was seen for congestive heart failure with cardiomyopathy. No medication changes. PLAN: Therefore, as the patient is feeling generally stable, no changes will be  made to her medical regimen. She is also receiving regular device checks from a clinic in [place]. No changes will be made in that regimen. The patient is instructed to continue her current therapy and return to this office in three to six months.    01/31/2022 Orpah Clinton MD (cardiology [place], [place]) - Patient was seen for Stage 3 chronic kidney disease, unspecified whether stage 3a or 3b CKD.  No medication changes.    11/20/2021 Orpah Clinton MD (cardiology [place], [place]) - Patient was seen for other heart failure and additional concerns  Hospital visits: 01/18/22 - For T2DM, [facility] ED in [place], no medication changes.   Objective:  Lab Results  Component Value Date   CREATININE 0.86 05/17/2022   BUN 12 05/17/2022   GFR 56.76 (L) 05/01/2022   EGFR 71 05/17/2022   GFRNONAA 68 01/18/2020   GFRAA 79 01/18/2020   NA 139 05/17/2022   K 4.4 05/17/2022   CALCIUM 9.8 05/17/2022   CO2 24 05/17/2022   GLUCOSE 103 (H) 05/17/2022    Lab Results  Component Value Date/Time   HGBA1C 6.8 (H) 05/01/2022 02:49 PM   HGBA1C 7.2 (H) 06/18/2021 07:53 AM   GFR 56.76 (L) 05/01/2022 02:49 PM   GFR 55.03 (L) 08/07/2021 08:39 AM   MICROALBUR <0.7 05/01/2022 02:49 PM   MICROALBUR <0.7 06/18/2021 07:53 AM    Last diabetic Eye exam: No results found for: "HMDIABEYEEXA"  Last diabetic Foot exam: No results found for: "HMDIABFOOTEX"   Lab Results  Component Value Date   CHOL 206 (H) 06/18/2021   HDL 44.10 06/18/2021   LDLCALC 71 10/08/2019  LDLDIRECT 140.0 06/18/2021   TRIG 210.0 (H) 06/18/2021   CHOLHDL 5 06/18/2021       Latest Ref Rng & Units 06/18/2021    7:53 AM 10/06/2019    7:35 PM 10/04/2019    9:28 AM  Hepatic Function  Total Protein 6.0 - 8.3 g/dL 7.6  7.3  7.0   Albumin 3.5 - 5.2 g/dL 4.3  4.0  3.7   AST 0 - 37 U/L '21  26  27   '$ ALT 0 - 35 U/L '16  26  28   '$ Alk Phosphatase 39 - 117 U/L 96  88  80   Total Bilirubin 0.2 - 1.2 mg/dL 0.3  0.4  0.9     Lab  Results  Component Value Date/Time   TSH 3.21 07/30/2021 09:09 AM       Latest Ref Rng & Units 05/17/2022    3:38 PM 07/30/2021    9:09 AM 12/10/2019    9:18 AM  CBC  WBC 3.4 - 10.8 x10E3/uL 7.6  7.7  8.9   Hemoglobin 11.1 - 15.9 g/dL 14.2  13.3  12.9   Hematocrit 34.0 - 46.6 % 42.2  40.1  38.5   Platelets 150 - 450 x10E3/uL 248  246.0  214.0     Lab Results  Component Value Date/Time   VITAMINB12 776 06/18/2021 07:53 AM   VITAMINB12 1,154 (H) 04/03/2020 10:10 AM    Clinical ASCVD: Yes  The ASCVD Risk score (Arnett DK, et al., 2019) failed to calculate for the following reasons:   The patient has a prior MI or stroke diagnosis    DEXA: Never done. Reviewed with patient today, she declines.     05/01/2022    2:01 PM 11/14/2021    3:54 PM 07/30/2021    8:31 AM  Depression screen PHQ 2/9  Decreased Interest 0 0 0  Down, Depressed, Hopeless 0 0 0  PHQ - 2 Score 0 0 0     Social History   Tobacco Use  Smoking Status Former  Smokeless Tobacco Never   BP Readings from Last 3 Encounters:  05/17/22 128/60  05/01/22 118/60  08/15/21 110/60   Pulse Readings from Last 3 Encounters:  05/17/22 90  05/01/22 67  08/15/21 76   Wt Readings from Last 3 Encounters:  05/17/22 171 lb (77.6 kg)  05/01/22 168 lb 4 oz (76.3 kg)  11/14/21 170 lb (77.1 kg)   BMI Readings from Last 3 Encounters:  05/17/22 28.46 kg/m  05/01/22 28.00 kg/m  11/14/21 28.29 kg/m    Allergies  Allergen Reactions   Penicillins Rash    Medications Reviewed Today     Reviewed by Martinique, Betty G, MD (Physician) on 05/18/22 at [place] Status: <None>   Medication Order Taking? Sig Documenting Provider Last Dose Status Informant  acetaminophen (TYLENOL) 650 MG CR tablet [id number] No Take 650 mg by mouth every 8 (eight) hours as needed for pain. [provider] Taking Active Self  aspirin EC 81 MG tablet [id number] No Take 81 mg by mouth daily. Swallow whole. [provider]  Taking Active   cholecalciferol (VITAMIN D3) 25 MCG (1000 UT) tablet [id number] No Take 1,000 Units by mouth daily. [provider] Taking Active Self  clopidogrel (PLAVIX) 75 MG tablet [id number] No Take 1 tablet (75 mg total) by mouth daily. Evans Lance, MD Taking Active   Cyanocobalamin (B-12) 2500 MCG TABS [id number] No Take 1 tablet by mouth daily. [provider] Taking  Active Self  diclofenac Sodium (VOLTAREN) 1 % GEL [id number]  Apply 2 g topically 4 (four) times daily. Martinique, Betty G, MD  Active   empagliflozin (JARDIANCE) 10 MG TABS tablet [id number]  TAKE 1 TABLET BY MOUTH EVERY DAY BEFORE BREAKFAST Martinique, Betty G, MD  Active   ezetimibe (ZETIA) 10 MG tablet [id number] No TAKE 1 TABLET BY MOUTH EVERY DAY Evans Lance, MD Taking Active   furosemide (LASIX) 20 MG tablet [id number] No TAKE 2 TABLETS BY MOUTH DAILY FOR 5 DAYS THEN 1 TABLET DAILY Martinique, Betty G, MD Taking Active   metoprolol succinate (TOPROL-XL) 50 MG 24 hr tablet [id number] No TAKE 1 TABLET BY MOUTH EVERY DAY WITH OR IMMEDIATELY FOLLOWING A MEAL Martinique, Betty G, MD Taking Active   pantoprazole (PROTONIX) 40 MG tablet [id number] No Take 1 tablet (40 mg total) by mouth daily. Martinique, Betty G, MD Taking Active   rosuvastatin (CRESTOR) 40 MG tablet [id number] No TAKE 1 TABLET BY MOUTH EVERYDAY AT BEDTIME Evans Lance, MD Taking Active   sacubitril-valsartan [facility]) 24-26 MG [id number]  Take 1 tablet by mouth 2 (two) times daily. Evans Lance, MD  Active   sertraline (ZOLOFT) 50 MG tablet [id number] No TAKE 1 TABLET BY MOUTH EVERY DAY Martinique, Betty G, MD Taking Active             SDOH:  (Social Determinants of Health) assessments and interventions performed: Yes SDOH Interventions    Flowsheet Row Clinical Support from 11/14/2021 in [place] at [place] from 10/06/2020 in [place] at [place] Patient Outreach Telephone from 06/27/2020 in  [place] Patient Outreach Telephone from 11/04/2019 in [place] Coordination  SDOH Interventions      Food Insecurity Interventions Intervention Not Indicated -- Intervention Not Indicated Intervention Not Indicated  Housing Interventions Intervention Not Indicated Intervention Not Indicated Intervention Not Indicated Intervention Not Indicated  Transportation Interventions Intervention Not Indicated Intervention Not Indicated Intervention Not Indicated Intervention Not Indicated  Financial Strain Interventions Intervention Not Indicated -- Intervention Not Indicated --  Physical Activity Interventions Intervention Not Indicated Intervention Not Indicated -- --  Stress Interventions Intervention Not Indicated -- Intervention Not Indicated --  Social Connections Interventions Intervention Not Indicated Intervention Not Indicated -- Other (Comment)  [spends time with son, daughter in law]       Medication Assistance:  Jardiance/Entresto coverage obtained through [facility] Cardiomyopathy medication assistance program.  Enrollment ends 04/24/2023  Medication Access: Within the past 30 days, how often has patient missed a dose of medication? None Is a pillbox or other method used to improve adherence? Yes  Factors that may affect medication adherence? adverse effects of medications Are meds synced by current pharmacy? No  Are meds delivered by current pharmacy? No  Does patient experience delays in picking up medications due to transportation concerns? No   [facility] Services Reviewed: Is patient disadvantaged to use [facility]?: Yes  Current Rx insurance plan: [employer] Name and location of Current pharmacy:  [id number] [place], [place] - [address] [place] [address]Phone: [phone]: [phone] [id number] [id number]: [phone]: [id number] [place] [id number]: [phone]: [phone]  [facility] Pharm[facility] services reviewed with patient today?: No  Patient requests to transfer care to [facility] Pharmacy?: No  Reason patient declined to change pharmacies: Luz Lex frequently  Compliance/Adherence/Medication fill history: Care Gaps: AWV - completed 11/14/2021, scheduled 11/19/2022 Last eye exam / retinopathy screening: 06/09/2021 Last diabetic foot exam: 06/18/2021 Tdap - never done Shingrix - never done Flu - never done Covid - overdue Pneumovax - postponed  Star-Rating Drugs: Jardiance '10mg'$  PDC 100% Rosuvastatin '40mg'$  PDC 0% Entresto 24-'26mg'$  PDC 57%   Assessment/Plan   Hypertension (BP goal <130/80) -Not assessed today -Current treatment: See heart failure section  Hyperlipidemia: (LDL goal < 70) -Uncontrolled (LDL 140 06/18/21) -Current treatment: Rosuvastatin '40mg'$  1 qd -not taking, refusal to take a statin Zetia '10mg'$  1 qd Appropriate, Effective, Safe, Accessible -Medications previously tried: None  -Current dietary patterns: limits fried greasy foods -Current exercise habits: walks daily for at least 30 min -Educated on Cholesterol goals;  Benefits of statin for ASCVD risk reduction; Importance of limiting foods high in cholesterol; Exercise goal of 150 minutes per week; Strategies to manage statin-induced myalgias; -Counseled on diet and exercise extensively Recommended to continue zetia. -Has not taken her statin in 2 years due to joint pain. Discussed the addition of CoQ10 to aid in myopathy/joint pain experienced with statin use. Patient declined -Discussed repatha/praluent in place of statin/zetia and access to getting for free, patient declined Diabetes (A1c goal <7%) -Controlled (A1C 6.8 on 05/01/22) -Current  medications: Jardiance '10mg'$  1 qd Appropriate, Effective, Safe, Accessible -Medications previously tried: None  -Current home glucose readings Only checks if feeling like sugar may be off. Does have access to supplies. -Denies hypoglycemic/hyperglycemic symptoms -Current meal patterns:  breakfast: cereal, coffee with milk  drinks: water, diet pepsi, one glass of wine each evening -Current exercise: see above -Educated on A1c and blood sugar goals; Complications of diabetes including kidney damage, retinal damage, and cardiovascular disease; Exercise goal of 150 minutes per week; Prevention and management of hypoglycemic episodes; Benefits of routine self-monitoring of blood sugar; -Counseled to check feet daily and get yearly eye exams -Recommended to continue current medication -Patient approved through [place] so she can obtain for free without entering her coverage gap.  Heart Failure (Goal: manage symptoms and prevent exacerbations) -Not ideally controlled -Last ejection fraction: 40-45% (Date: 10/07/19) -HF type: HFmrEF (mildly reduced EF 41-49%) -NYHA Class: III (marked limitation of activity) -AHA HF Stage: C (Heart disease and symptoms present) -Current treatment: Lasix '20mg'$  1 qd Appropriate, Effective, Safe, Accessible Metoprolol XL '50mg'$  1 qd Appropriate, Effective, Safe, Accessible Entresto 24-'26mg'$  1 BID Appropriate, Effective, Safe, Accessible -Medications previously tried: None  -Current home BP/HR readings: checks once weekly, no log today -Current home daily weights: checks daily, ranges from 168-171 -Current dietary habits: mindful of salt intake -Current exercise habits: see above -Educated on Benefits of medications for managing symptoms and prolonging life Importance of weighing daily; if you gain more than 3 pounds in one day or 5 pounds in one week, contact office Importance of blood pressure control -Counseled on diet and exercise  extensively Recommended to continue current medication Assessed patient finances. START entresto now that it can be obtained for free through [employer], will ask pcp to resend a new prescription.  CKD (Goal: Prevent progression of kidney disease) -Controlled -Drinks adequate amount of water -Medications previously tried: None  -Counseled on diet and exercise extensively -Counseled on avoidance of NSAIDs  CAD (Goal: Prevent ASCVD and bleeding) -Controlled -Current treatment  Clopidogrel '75mg'$  1 qd  Appropriate, Effective, Safe, Accessible Aspirin '81mg'$  1 qd Appropriate, Effective, Safe, Accessible Pantoprazole '40mg'$  1 qd Appropriate, Effective, Safe, Accessible -Medications previously tried: None  -Counseled on bleed risk and monitoring for signs of a bleed  Anxiety (Goal: Achieve and maintain an anxiety level that allows for ADL and normal function) -Not assessed today -Current treatment: Sertraline '50mg'$  1 qd Appropriate, Effective, Safe, Accessible   Vit B12 Deficiency (Goal: Vit B12 WNL) -Not assessed today -Current treatment  Vit B12 2586mg 1 qd Appropriate, Effective, Safe, Accessible   OTC Medicaitons (Goal: Prevent medicaiton interactions and overmedication) -Current treatment  Acetaminophen '650mg'$  1 q8h prn Appropriate, Effective, Safe, Accessible Diclofenac 1% gel 2 g QID Appropriate, Effective, Safe, Accessible   AMaren ReamerClinical Pharmacist [phone]

## 2022-05-24 ENCOUNTER — Ambulatory Visit: Payer: Medicare HMO

## 2022-06-04 ENCOUNTER — Other Ambulatory Visit: Payer: Self-pay | Admitting: Family Medicine

## 2022-06-04 DIAGNOSIS — I5020 Unspecified systolic (congestive) heart failure: Secondary | ICD-10-CM

## 2022-06-04 MED ORDER — ENTRESTO 24-26 MG PO TABS: 1.0000 | ORAL_TABLET | Freq: Two times a day (BID) | ORAL | 1 refills | Status: DC

## 2022-06-04 NOTE — Addendum Note (Signed)
Addended by: Martinique, Eliya Bubar G on: 06/04/2022 05:51 PM   Modules accepted: Orders

## 2022-06-05 ENCOUNTER — Telehealth: Payer: Self-pay

## 2022-06-05 ENCOUNTER — Ambulatory Visit (INDEPENDENT_AMBULATORY_CARE_PROVIDER_SITE_OTHER): Payer: Medicare HMO

## 2022-06-05 DIAGNOSIS — I255 Ischemic cardiomyopathy: Secondary | ICD-10-CM

## 2022-06-05 NOTE — Telephone Encounter (Signed)
Notified patient that April Lowery has been sent into the pharmacy for pickup and that updated labwork will need to be drawn within 7-10 days of her starting the medication. Patient expressed understanding, and states she will call back to schedule labwork once she starts the medication.  [place] Pharmacist [phone]

## 2022-06-07 LAB — CUP PACEART REMOTE DEVICE CHECK
Battery Remaining Longevity: 120 mo
Battery Voltage: 3.01 V
Brady Statistic AP VP Percent: 11.54 %
Brady Statistic AP VS Percent: 32.4 %
Brady Statistic AS VP Percent: 2.43 %
Brady Statistic AS VS Percent: 53.64 %
Brady Statistic RA Percent Paced: 44.04 %
Brady Statistic RV Percent Paced: 13.96 %
Date Time Interrogation Session: 20240313040105
Implantable Lead Connection Status: 753985
Implantable Lead Connection Status: 753985
Implantable Lead Implant Date: 20191121
Implantable Lead Implant Date: 20191121
Implantable Lead Location: 753859
Implantable Lead Location: 753860
Implantable Lead Model: 4076
Implantable Lead Model: 4076
Implantable Pulse Generator Implant Date: 20191121
Lead Channel Impedance Value: 323 Ohm
Lead Channel Impedance Value: 361 Ohm
Lead Channel Impedance Value: 437 Ohm
Lead Channel Impedance Value: 437 Ohm
Lead Channel Pacing Threshold Amplitude: 0.625 V
Lead Channel Pacing Threshold Amplitude: 0.875 V
Lead Channel Pacing Threshold Pulse Width: 0.4 ms
Lead Channel Pacing Threshold Pulse Width: 0.4 ms
Lead Channel Sensing Intrinsic Amplitude: 2.375 mV
Lead Channel Sensing Intrinsic Amplitude: 2.375 mV
Lead Channel Sensing Intrinsic Amplitude: 27 mV
Lead Channel Sensing Intrinsic Amplitude: 27 mV
Lead Channel Setting Pacing Amplitude: 1.5 V
Lead Channel Setting Pacing Amplitude: 2 V
Lead Channel Setting Pacing Pulse Width: 0.4 ms
Lead Channel Setting Sensing Sensitivity: 1.2 mV
Zone Setting Status: 755011
Zone Setting Status: 755011

## 2022-06-21 NOTE — Progress Notes (Unsigned)
  Electrophysiology Office Note:   Date:  06/24/2022  ID:  April Lowery, DOB September 26, 1945, MRN [id number]  Primary Cardiologist: None Electrophysiologist: Cristopher Peru, MD   History of Present Illness:   April Lowery is a 77 y.o. female for routine electrophysiology followup. Since last being seen in our clinic the patient reports doing very well.  she denies chest pain, palpitations, dyspnea, PND, orthopnea, nausea, vomiting, dizziness, syncope, edema, weight gain, or early satiety.   Review of systems complete and found to be negative unless listed in HPI.   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  EKG is ordered today. Personal review shows NSr at 69 bpm  Cath 08/01/2021 ([facility]) CONCLUSIONS:    1.  Severe 3V CAD.     A.  Occluded distal LAD.  (Either not grafted or graft occluded.)   B.  Occluded CFX.  SVG to OM patent.   C.  Occluded RCA.  Occluded stent in RCA, occluded SVG to RCA.  2.  1 of 3 grafts patent.  SVG to OM patent.  3.  LVEDP 15 mmHg.  4.  RFA access, closed with Perclose.   Risk Assessment/Calculations:              Physical Exam:   VS:  BP 128/60   Pulse 69   Ht 5\' 5"  (1.651 m)   Wt 170 lb 12.8 oz (77.5 kg)   SpO2 97%   BMI 28.42 kg/m    Wt Readings from Last 3 Encounters:  06/24/22 170 lb 12.8 oz (77.5 kg)  05/17/22 171 lb (77.6 kg)  05/01/22 168 lb 4 oz (76.3 kg)     GEN: Well nourished, well developed in no acute distress NECK: No JVD; No carotid bruits CARDIAC: Regular rate and rhythm, no murmurs, rubs, gallops RESPIRATORY:  Clear to auscultation without rales, wheezing or rhonchi  ABDOMEN: Soft, non-tender, non-distended EXTREMITIES:  No edema; No deformity   ASSESSMENT AND PLAN:    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  No indication for updated echo at this time with lack of symptoms.   Disposition:   Follow up with Dr. Lovena Le in 12 months  Signed, Shirley Friar, PA-C

## 2022-06-24 ENCOUNTER — Ambulatory Visit: Payer: Medicare HMO | Attending: Student | Admitting: Student

## 2022-06-24 ENCOUNTER — Encounter: Payer: Self-pay | Admitting: Student

## 2022-06-24 VITALS — BP 128/60 | HR 69 | Ht 65.0 in | Wt 170.8 lb

## 2022-06-24 DIAGNOSIS — I255 Ischemic cardiomyopathy: Secondary | ICD-10-CM

## 2022-06-24 DIAGNOSIS — I5022 Chronic systolic (congestive) heart failure: Secondary | ICD-10-CM

## 2022-06-24 DIAGNOSIS — I442 Atrioventricular block, complete: Secondary | ICD-10-CM

## 2022-06-24 LAB — CUP PACEART INCLINIC DEVICE CHECK
Battery Remaining Longevity: 121 mo
Battery Voltage: 3.01 V
Brady Statistic AP VP Percent: 16.68 %
Brady Statistic AP VS Percent: 33.91 %
Brady Statistic AS VP Percent: 6.71 %
Brady Statistic AS VS Percent: 42.7 %
Brady Statistic RA Percent Paced: 50.62 %
Brady Statistic RV Percent Paced: 23.39 %
Date Time Interrogation Session: 20240401123007
Implantable Lead Connection Status: 753985
Implantable Lead Connection Status: 753985
Implantable Lead Implant Date: 20191121
Implantable Lead Implant Date: 20191121
Implantable Lead Location: 753859
Implantable Lead Location: 753860
Implantable Lead Model: 4076
Implantable Lead Model: 4076
Implantable Pulse Generator Implant Date: 20191121
Lead Channel Impedance Value: 361 Ohm
Lead Channel Impedance Value: 399 Ohm
Lead Channel Impedance Value: 475 Ohm
Lead Channel Impedance Value: 532 Ohm
Lead Channel Pacing Threshold Amplitude: 0.75 V
Lead Channel Pacing Threshold Amplitude: 0.875 V
Lead Channel Pacing Threshold Pulse Width: 0.4 ms
Lead Channel Pacing Threshold Pulse Width: 0.4 ms
Lead Channel Sensing Intrinsic Amplitude: 2.375 mV
Lead Channel Sensing Intrinsic Amplitude: 2.75 mV
Lead Channel Sensing Intrinsic Amplitude: 25 mV
Lead Channel Sensing Intrinsic Amplitude: 31.625 mV
Lead Channel Setting Pacing Amplitude: 1.5 V
Lead Channel Setting Pacing Amplitude: 2 V
Lead Channel Setting Pacing Pulse Width: 0.4 ms
Lead Channel Setting Sensing Sensitivity: 1.2 mV
Zone Setting Status: 755011
Zone Setting Status: 755011

## 2022-06-24 NOTE — Patient Instructions (Signed)
Medication Instructions:  Your physician recommends that you continue on your current medications as directed. Please refer to the Current Medication list given to you today.  *If you need a refill on your cardiac medications before your next appointment, please call your pharmacy*  Lab Work: None If you have labs (blood work) drawn today and your tests are completely normal, you will receive your results only by: [place] (if you have MyChart) OR A paper copy in the mail If you have any lab test that is abnormal or we need to change your treatment, we will call you to review the results.  Follow-Up: At [facility], you and your health needs are our priority.  As part of our continuing mission to provide you with exceptional heart care, we have created designated Provider Care Teams.  These Care Teams include your primary Cardiologist (physician) and Advanced Practice Providers (APPs -  Physician Assistants and Nurse Practitioners) who all work together to provide you with the care you need, when you need it.  Your next appointment:   1 year(s)  Provider:   Cristopher Peru, MD or Lollie Marrow, PA-C

## 2022-06-28 ENCOUNTER — Encounter: Payer: Self-pay | Admitting: Family Medicine

## 2022-07-08 ENCOUNTER — Ambulatory Visit: Payer: Medicare HMO | Admitting: Family Medicine

## 2022-07-08 ENCOUNTER — Other Ambulatory Visit (INDEPENDENT_AMBULATORY_CARE_PROVIDER_SITE_OTHER): Payer: Medicare HMO

## 2022-07-08 DIAGNOSIS — I5020 Unspecified systolic (congestive) heart failure: Secondary | ICD-10-CM

## 2022-07-08 LAB — BASIC METABOLIC PANEL
BUN: 15 mg/dL (ref 6–23)
CO2: 27 mEq/L (ref 19–32)
Calcium: 9.5 mg/dL (ref 8.4–10.5)
Chloride: 102 mEq/L (ref 96–112)
Creatinine, Ser: 0.85 mg/dL (ref 0.40–1.20)
GFR: 67.25 mL/min (ref >60.00)
Glucose, Bld: 125 mg/dL — ABNORMAL HIGH (ref 70–99)
Potassium: 3.9 mEq/L (ref 3.5–5.1)
Sodium: 138 mEq/L (ref 135–145)

## 2022-07-10 ENCOUNTER — Telehealth: Payer: Self-pay

## 2022-07-10 NOTE — Progress Notes (Signed)
Care Management & Coordination Services Pharmacy Team  Reason for Encounter: Hypertension  Contacted patient to discuss hypertension disease state. Spoke with patient on 07/10/2022     Current antihypertensive regimen:  metoprolol  50 MG 1 tablet daily Furosemide 20 mg daily Entresto 24-26 1 tablet twice daily  Patient verbally confirms she is taking the above medications as directed. Yes  How often are you checking your Blood Pressure? Patient is checking blood pressures 2-3 times per week, she checks her blood pressure in the middle of the day after taking her medication.  Current home BP readings:  DATE:             BP               PULSE 07/08/22 135/65  77 07/05/22 139/63  63  Wrist or arm cuff: Wrist cuff, this has been compared at her pcp office and reads in range.  OTC medications including pseudoephedrine or NSAIDs? Patient denies  Any readings above 180/100? Patient denies  What recent interventions/DTPs have been made by any provider to improve Blood Pressure control since last CPP Visit: No recent interventions  Any recent hospitalizations or ED visits since last visit with CPP? No recent hospital visits.   What diet changes have been made to improve Blood Pressure Control?  Patient follows noth Breakfast - danish with yogurt, cereal or eggs Lunch - crackers with [name] and cheese and a fruit Dinner - small serving of meat and vegetables Caffeine - 1 cup coffee Salt - she doesn't add salt  What exercise is being done to improve your Blood Pressure Control?  Patient walks 3000-4000 steps daily and works in the garden  Adherence Review: Is the patient currently on ACE/ARB medication? No Does the patient have >5 day gap between last estimated fill dates? No  Care Gaps: AWV - completed 11/14/2021, scheduled 11/19/2022 Last eye exam - 06/09/2021 Last foot exam - 06/18/2021 Last BP - 128/60 on 06/24/2022 Last A1C - 6.8 on 05/01/2022 Tdap - never  done Shingrix - never done Covid - overdue Pneumovax - postponed   Star Rating Drugs:  Jardiance 10 mg - last filled 07/06/2022 30 DS at [facility]  Chart Updates: Recent office visits:  None  Recent consult visits:  06/24/2022 Maxine Glenn PA-C (cardiology) - Patient was seen for Ischemic cardiomyopathy and additional concerns. No medication changes.  Hospital visits:  None  Medications: Outpatient Encounter Medications as of 07/10/2022  Medication Sig   acetaminophen (TYLENOL) 650 MG CR tablet Take 650 mg by mouth every 8 (eight) hours as needed for pain.   aspirin EC 81 MG tablet Take 81 mg by mouth daily. Swallow whole.   cholecalciferol (VITAMIN D3) 25 MCG (1000 UT) tablet Take 1,000 Units by mouth daily.   clopidogrel (PLAVIX) 75 MG tablet Take 1 tablet (75 mg total) by mouth daily.   Cyanocobalamin (B-12) 2500 MCG TABS Take 1 tablet by mouth daily.   diclofenac Sodium (VOLTAREN) 1 % GEL Apply 2 g topically 4 (four) times daily.   empagliflozin (JARDIANCE) 10 MG TABS tablet TAKE 1 TABLET BY MOUTH EVERY DAY BEFORE BREAKFAST   ezetimibe (ZETIA) 10 MG tablet TAKE 1 TABLET BY MOUTH EVERY DAY   furosemide (LASIX) 20 MG tablet TAKE 2 TABLETS BY MOUTH DAILY FOR 5 DAYS THEN 1 TABLET DAILY   metoprolol succinate (TOPROL-XL) 50 MG 24 hr tablet TAKE 1 TABLET BY MOUTH EVERY DAY WITH OR IMMEDIATELY FOLLOWING A MEAL   pantoprazole (PROTONIX) 40 MG  tablet Take 1 tablet (40 mg total) by mouth daily.   sacubitril-valsartan (ENTRESTO) 24-26 MG Take 1 tablet by mouth 2 (two) times daily.   sertraline (ZOLOFT) 50 MG tablet TAKE 1 TABLET BY MOUTH EVERY DAY   No facility-administered encounter medications on file as of 07/10/2022.  Fill History:  Dispensed Days Supply Quantity Provider Pharmacy  CLOPIDOGREL 75 MG TABLET 04/13/2022 90 90 each      Dispensed Days Supply Quantity Provider Pharmacy  DICLOFENAC SODIUM 1% GEL 05/01/2022 12 100 g      Dispensed Days Supply Quantity Provider Pharmacy   Jardiance 10 mg tablet 07/06/2022 30 30 tablet  [id number]...    Dispensed Days Supply Quantity Provider Pharmacy  EZETIMIBE 10 MG TABLET 06/18/2022 90 90 each Marinus Maw, MD [facility] [phone] - D...    Dispensed Days Supply Quantity Provider Pharmacy  FUROSEMIDE 20 MG TABLET 04/26/2022 90 90 each [place], Betty G, MD     Dispensed Days Supply Quantity Provider Pharmacy  metoprolol succinate ER 50 mg tablet,extended release 24 hr 06/29/2022 90 90 tablet      Dispensed Days Supply Quantity Provider Pharmacy  PANTOPRAZOLE SOD DR 40 MG TAB 04/13/2022 90 90 each      Dispensed Days Supply Quantity Provider Pharmacy  ENTRESTO 24 MG-26 MG TABLET 06/04/2022 90 180 each      Dispensed Days Supply Quantity Provider Pharmacy  sertraline 50 mg tablet 05/20/2022 90 90 tablet     Recent Office Vitals: BP Readings from Last 3 Encounters:  06/24/22 128/60  05/24/22 137/67  05/17/22 128/60   Pulse Readings from Last 3 Encounters:  06/24/22 69  05/17/22 90  05/01/22 67    Wt Readings from Last 3 Encounters:  06/24/22 170 lb 12.8 oz (77.5 kg)  05/17/22 171 lb (77.6 kg)  05/01/22 168 lb 4 oz (76.3 kg)     Kidney Function Lab Results  Component Value Date/Time   CREATININE 0.85 07/08/2022 09:45 AM   CREATININE 0.86 05/17/2022 03:38 PM   CREATININE 0.85 01/18/2020 02:42 PM   CREATININE 0.96 (H) 12/22/2019 12:34 PM   GFR 67.25 07/08/2022 09:45 AM   GFRNONAA 68 01/18/2020 02:42 PM   GFRAA 79 01/18/2020 02:42 PM       Latest Ref Rng & Units 07/08/2022    9:45 AM 05/17/2022    3:38 PM 05/01/2022    2:49 PM  BMP  Glucose 70 - 99 mg/dL 960  454  098   BUN 6 - 23 mg/dL Creatinine 0.40 - 1.20 mg/dL 1.19  1.47  8.29   BUN/Creat Ratio 12 - 28  14    Sodium 135 - 145 mEq/L 138  139  137   Potassium 3.5 - 5.1 mEq/L 3.9  4.4  4.3   Chloride 96 - 112 mEq/L 102  100  103   CO2 19 - 32 mEq/L Calcium 8.4 - 10.5 mg/dL 9.5  9.8  9.8    Inetta Fermo  [facility]  Clinical Pharmacist Assistant [phone]

## 2022-07-10 NOTE — Progress Notes (Signed)
Remote pacemaker transmission.   

## 2022-07-12 ENCOUNTER — Other Ambulatory Visit: Payer: Self-pay | Admitting: Internal Medicine

## 2022-07-16 ENCOUNTER — Encounter: Payer: Self-pay | Admitting: Family Medicine

## 2022-07-16 ENCOUNTER — Ambulatory Visit (INDEPENDENT_AMBULATORY_CARE_PROVIDER_SITE_OTHER): Payer: Medicare HMO | Admitting: Family Medicine

## 2022-07-16 ENCOUNTER — Ambulatory Visit (INDEPENDENT_AMBULATORY_CARE_PROVIDER_SITE_OTHER): Payer: Medicare HMO

## 2022-07-16 VITALS — BP 120/62 | HR 70 | Temp 98.3°F | Resp 16 | Ht 65.0 in | Wt 173.5 lb

## 2022-07-16 DIAGNOSIS — S299XXA Unspecified injury of thorax, initial encounter: Secondary | ICD-10-CM | POA: Diagnosis not present

## 2022-07-16 DIAGNOSIS — R0781 Pleurodynia: Secondary | ICD-10-CM | POA: Diagnosis not present

## 2022-07-16 DIAGNOSIS — R0789 Other chest pain: Secondary | ICD-10-CM | POA: Diagnosis not present

## 2022-07-16 NOTE — Progress Notes (Unsigned)
ACUTE VISIT Chief Complaint  Patient presents with   Flank Pain    Left side, fell into a chair last Saturday. Pain is constant. Goes from side to back.    HPI: [name] is a 77 y.o. female, who is here today complaining of *** HPI  severe left rib cage pain following an incident three days ago. The patient reports that she was bumped into and lost her balance, falling onto the back of a wooden chair. The pain was not immediately noticeable, but the patient experienced significant soreness the following day.  The pain is located around the left ribcage and extends to the back. The patient denies any visible bruising or swelling in the affected area. The pain intensity is described as close to a 10 on a scale of 1 to 10. The patient has been taking two Tylenol at bedtime for pain management but has difficulty sleeping in bed and resorts to sitting in a chair.  The patient reports that the pain worsens with movement and deep breathing. She also describes a sensation of hardness in the affected area, which makes it difficult to breathe at times. The patient denies any coughing or redness in the area. Review of Systems See other pertinent positives and negatives in HPI.  Current Outpatient Medications on File Prior to Visit  Medication Sig Dispense Refill   acetaminophen (TYLENOL) 650 MG CR tablet Take 650 mg by mouth every 8 (eight) hours as needed for pain.     aspirin EC 81 MG tablet Take 81 mg by mouth daily. Swallow whole.     cholecalciferol (VITAMIN D3) 25 MCG (1000 UT) tablet Take 1,000 Units by mouth daily.     clopidogrel (PLAVIX) 75 MG tablet TAKE 1 TABLET BY MOUTH EVERY DAY 90 tablet 3   Cyanocobalamin (B-12) 2500 MCG TABS Take 1 tablet by mouth daily.     diclofenac Sodium (VOLTAREN) 1 % GEL Apply 2 g topically 4 (four) times daily. 150 g 1   empagliflozin (JARDIANCE) 10 MG TABS tablet TAKE 1 TABLET BY MOUTH EVERY DAY BEFORE BREAKFAST 90 tablet 2   ezetimibe (ZETIA) 10  MG tablet TAKE 1 TABLET BY MOUTH EVERY DAY 90 tablet 3   furosemide (LASIX) 20 MG tablet TAKE 2 TABLETS BY MOUTH DAILY FOR 5 DAYS THEN 1 TABLET DAILY 90 tablet 1   metoprolol succinate (TOPROL-XL) 50 MG 24 hr tablet TAKE 1 TABLET BY MOUTH EVERY DAY WITH OR IMMEDIATELY FOLLOWING A MEAL 90 tablet 1   pantoprazole (PROTONIX) 40 MG tablet Take 1 tablet (40 mg total) by mouth daily. 90 tablet 3   sacubitril-valsartan (ENTRESTO) 24-26 MG Take 1 tablet by mouth 2 (two) times daily. 180 tablet 1   sertraline (ZOLOFT) 50 MG tablet TAKE 1 TABLET BY MOUTH EVERY DAY 90 tablet 1   No current facility-administered medications on file prior to visit.    Past Medical History:  Diagnosis Date   Acute ST segment elevation myocardial infarction    Arthritis    CAD (coronary artery disease) 02/15/2019   Cancer of left kidney    Complete atrioventricular block    DVT (deep venous thrombosis) 02/15/2019   GI bleeding    History of cerebellar stroke 02/15/2019   HLD (hyperlipidemia) 02/15/2019   HTN (hypertension) 02/15/2019   Ischemic cardiomyopathy    Melena    Pacemaker    Pulmonary thromboembolism 02/15/2019   Allergies  Allergen Reactions   Penicillins Rash   Social History  Socioeconomic History   Marital status: Widowed    Spouse name: Not on file   Number of children: 4   Years of education: Not on file   Highest education level: 12th grade  Occupational History   Occupation: retired  Tobacco Use   Smoking status: Former   Smokeless tobacco: Never  Building services engineer Use: Never used  Substance and Sexual Activity   Alcohol use: Never   Drug use: Never   Sexual activity: Not on file    Comment: MARRIED  Other Topics Concern   Not on file  Social History Narrative   Not on file   Social Determinants of Health   Financial Resource Strain: Low Risk  (11/14/2021)   Overall Financial Resource Strain (CARDIA)    Difficulty of Paying Living Expenses: Not hard at all  Food  Insecurity: No Food Insecurity (05/24/2022)   Hunger Vital Sign    Worried About Running Out of Food in the Last Year: Never true    Ran Out of Food in the Last Year: Never true  Transportation Needs: No Transportation Needs (05/24/2022)   PRAPARE - Administrator, Civil Service (Medical): No    Lack of Transportation (Non-Medical): No  Physical Activity: Sufficiently Active (05/24/2022)   Exercise Vital Sign    Days of Exercise per Week: 5 days    Minutes of Exercise per Session: 30 min  Stress: No Stress Concern Present (11/14/2021)   [employer] of Occupational Health - Occupational Stress Questionnaire    Feeling of Stress : Not at all  Social Connections: Socially Isolated (11/14/2021)   Social Connection and Isolation Panel [NHANES]    Frequency of Communication with Friends and Family: More than three times a week    Frequency of Social Gatherings with Friends and Family: More than three times a week    Attends Religious Services: Never    Database administrator or Organizations: No    Attends Banker Meetings: Never    Marital Status: Widowed   Vitals:   07/16/22 1520  BP: 120/62  Pulse: 70  Temp: 98.3 F (36.8 C)  SpO2: 94%   Body mass index is 28.87 kg/m.  Physical Exam Vitals and nursing note reviewed.  Constitutional:      General: She is not in acute distress.    Appearance: She is well-developed.  HENT:     Head: Normocephalic and atraumatic.     Mouth/Throat:     Mouth: Mucous membranes are moist.     Pharynx: Oropharynx is clear.  Eyes:     Conjunctiva/sclera: Conjunctivae normal.  Cardiovascular:     Rate and Rhythm: Normal rate and regular rhythm.     Pulses:          Dorsalis pedis pulses are 2+ on the right side and 2+ on the left side.     Heart sounds: No murmur heard. Pulmonary:     Effort: Pulmonary effort is normal. No respiratory distress.     Breath sounds: Normal breath sounds.  Chest:    Abdominal:      Palpations: Abdomen is soft. There is no hepatomegaly or mass.     Tenderness: There is no abdominal tenderness.  Lymphadenopathy:     Cervical: No cervical adenopathy.  Skin:    General: Skin is warm.     Findings: No erythema or rash.  Neurological:     General: No focal deficit present.     Mental  Status: She is alert and oriented to person, place, and time.     Cranial Nerves: No cranial nerve deficit.     Gait: Gait normal.  Psychiatric:     Comments: Well groomed, good eye contact.    ASSESSMENT AND PLAN: Costal margin pain -     DG Ribs Unilateral W/Chest Left; Future  Traumatic injury of rib -     DG Ribs Unilateral W/Chest Left; Future    No follow-ups on file.  Tykerria Mccubbins G. [place], MD  [facility]. [place] office.  Discharge Instructions   None

## 2022-07-16 NOTE — Patient Instructions (Signed)
A few things to remember from today's visit:  Costal margin pain - Plan: DG Ribs Unilateral W/Chest Left  Traumatic injury of rib - Plan: DG Ribs Unilateral W/Chest Left Avoid shallow breathing. Continue Tylenol 500 mg 1-2 tabs 2-3 times per day as needed.  If you need refills for medications you take chronically, please call your pharmacy. Do not use My Chart to request refills or for acute issues that need immediate attention. If you send a my chart message, it may take a few days to be addressed, specially if I am not in the office.  Please be sure medication list is accurate. If a new problem present, please set up appointment sooner than planned today.

## 2022-07-17 ENCOUNTER — Encounter: Payer: Self-pay | Admitting: Family Medicine

## 2022-07-17 NOTE — Addendum Note (Signed)
Addended by: [place], Britiany Silbernagel G on: 07/17/2022 08:18 AM   Modules accepted: Level of Service

## 2022-07-18 ENCOUNTER — Encounter: Payer: Self-pay | Admitting: Family Medicine

## 2022-08-20 DIAGNOSIS — C4441 Basal cell carcinoma of skin of scalp and neck: Secondary | ICD-10-CM | POA: Diagnosis not present

## 2022-08-20 DIAGNOSIS — D485 Neoplasm of uncertain behavior of skin: Secondary | ICD-10-CM | POA: Diagnosis not present

## 2022-08-22 ENCOUNTER — Encounter: Payer: Self-pay | Admitting: Family Medicine

## 2022-08-26 ENCOUNTER — Encounter: Payer: Self-pay | Admitting: Internal Medicine

## 2022-08-26 NOTE — Progress Notes (Signed)
Care Management & Coordination Services Pharmacy Note  08/28/2022 Name:  April Lowery MRN:  [id number] DOB:  [birth year]  Summary: BP at goal <130/80, no issues obtaining medication LDL not at goal, declines any therapies other than zetia  Recommendations/Changes made from today's visit: -Counseled on importance of med adherence and to inform us immediately if any issues obtaining medications -Counseled to continue to check weight daily, monitoring for >3lbs gained in 1 day or >5lbs in 1 week -Counseled on importance of sodium restriction and cholesterol-lowering diet  Follow up plan: General follow up in 2 months (check in to see if skin cancer removal go okay, still getting all medications okay) BP call in 4 months Pharmacist visit in 5-6 months   Subjective: April Lowery is an 77 y.o. year old female who is a primary patient of Lowery, Timoteo Expose, MD.  The care coordination team was consulted for assistance with disease management and care coordination needs.    Engaged with patient by telephone for initial visit.  Recent office visits: 07/16/22 April [place], MD - For costal margin pain and other concerns. No med changes  05/17/2022 April [place] MD - Patient was seen for lower abdominal pain and additional concerns. No medication changes.   05/21/2022 April [place]and MD - Patient was seen for Acute right-sided low back pain with right-sided sciatica and additional concerns. Started Diclofenac topical.  Recent consult visits: 06/24/2022 April Glenn PA-C (cardiology) - Patient was seen for Ischemic cardiomyopathy and additional concerns. No medication changes.   02/26/2022 April Bow MD (cardiology [place], [place]) - Patient was seen for congestive heart failure with cardiomyopathy. No medication changes.     Hospital visits: None in previous 6 months   Objective:  Lab Results  Component Value Date   CREATININE 0.85 07/08/2022   BUN 15 07/08/2022   GFR 67.25 07/08/2022    EGFR 71 05/17/2022   GFRNONAA 68 01/18/2020   GFRAA 79 01/18/2020   NA 138 07/08/2022   K 3.9 07/08/2022   CALCIUM 9.5 07/08/2022   CO2 27 07/08/2022   GLUCOSE 125 (H) 07/08/2022    Lab Results  Component Value Date/Time   HGBA1C 6.8 (H) 05/01/2022 02:49 PM   HGBA1C 7.2 (H) 06/18/2021 07:53 AM   GFR 67.25 07/08/2022 09:45 AM   GFR 56.76 (L) 05/01/2022 02:49 PM   MICROALBUR <0.7 05/01/2022 02:49 PM   MICROALBUR <0.7 06/18/2021 07:53 AM    Last diabetic Eye exam: No results found for: "HMDIABEYEEXA"  Last diabetic Foot exam: No results found for: "HMDIABFOOTEX"   Lab Results  Component Value Date   CHOL 206 (H) 06/18/2021   HDL 44.10 06/18/2021   LDLCALC 71 10/08/2019   LDLDIRECT 140.0 06/18/2021   TRIG 210.0 (H) 06/18/2021   CHOLHDL 5 06/18/2021       Latest Ref Rng & Units 06/18/2021    7:53 AM 10/06/2019    7:35 PM 10/04/2019    9:28 AM  Hepatic Function  Total Protein 6.0 - 8.3 g/dL 7.6  7.3  7.0   Albumin 3.5 - 5.2 g/dL 4.3  4.0  3.7   AST 0 - 37 U/L 21  26  27    ALT 0 - 35 U/L 16  26  28    Alk Phosphatase 39 - 117 U/L 96  88  80   Total Bilirubin 0.2 - 1.2 mg/dL 0.3  0.4  0.9     Lab Results  Component Value Date/Time   TSH 3.21 07/30/2021 09:09 AM  Latest Ref Rng & Units 05/17/2022    3:38 PM 07/30/2021    9:09 AM 12/10/2019    9:18 AM  CBC  WBC 3.4 - 10.8 x10E3/uL 7.6  7.7  8.9   Hemoglobin 11.1 - 15.9 g/dL 04.5  40.9  81.1   Hematocrit 34.0 - 46.6 % 42.2  40.1  38.5   Platelets 150 - 450 x10E3/uL 248  246.0  214.0     Lab Results  Component Value Date/Time   VITAMINB12 776 06/18/2021 07:53 AM   VITAMINB12 1,154 (H) 04/03/2020 10:10 AM    Clinical ASCVD: Yes  The ASCVD Risk score (Arnett DK, et al., 2019) failed to calculate for the following reasons:   The patient has a prior MI or stroke diagnosis    DEXA: Never done. Reviewed with patient today, she declines.     07/16/2022    3:30 PM 05/01/2022    2:01 PM 11/14/2021    3:54 PM   Depression screen PHQ 2/9  Decreased Interest 0 0 0  Down, Depressed, Hopeless 0 0 0  PHQ - 2 Score 0 0 0     Social History   Tobacco Use  Smoking Status Former  Smokeless Tobacco Never   BP Readings from Last 3 Encounters:  07/16/22 120/62  06/24/22 128/60  05/24/22 137/67   Pulse Readings from Last 3 Encounters:  07/16/22 70  06/24/22 69  05/17/22 90   Wt Readings from Last 3 Encounters:  07/16/22 173 lb 8 oz (78.7 kg)  06/24/22 170 lb 12.8 oz (77.5 kg)  05/17/22 171 lb (77.6 kg)   BMI Readings from Last 3 Encounters:  07/16/22 28.87 kg/m  06/24/22 28.42 kg/m  05/17/22 28.46 kg/m    Allergies  Allergen Reactions   Penicillins Rash    Medications Reviewed Today     Reviewed by Sherrill Raring, RPH (Pharmacist) on 08/28/22 at 1333  Med List Status: <None>   Medication Order Taking? Sig Documenting Provider Last Dose Status Informant  acetaminophen (TYLENOL) 650 MG CR tablet [id number] No Take 650 mg by mouth every 8 (eight) hours as needed for pain. [provider] Taking Active Self  aspirin EC 81 MG tablet [id number] No Take 81 mg by mouth daily. Swallow whole. [provider] Taking Active   cholecalciferol (VITAMIN D3) 25 MCG (1000 UT) tablet [id number] No Take 1,000 Units by mouth daily. [provider] Taking Active Self  clopidogrel (PLAVIX) 75 MG tablet [id number] No TAKE 1 TABLET BY MOUTH EVERY DAY Marinus Maw, MD Taking Active   Cyanocobalamin (B-12) 2500 MCG TABS [id number] No Take 1 tablet by mouth daily. [provider] Taking Active Self  diclofenac Sodium (VOLTAREN) 1 % GEL [id number] No Apply 2 g topically 4 (four) times daily. Lowery, April G, MD Taking Active   empagliflozin (JARDIANCE) 10 MG TABS tablet [id number] No TAKE 1 TABLET BY MOUTH EVERY DAY BEFORE BREAKFAST Lowery, April G, MD Taking Active   ezetimibe (ZETIA) 10 MG tablet [id number] No TAKE 1 TABLET BY MOUTH EVERY DAY Marinus Maw, MD Taking  Active   furosemide (LASIX) 20 MG tablet [id number] No TAKE 2 TABLETS BY MOUTH DAILY FOR 5 DAYS THEN 1 TABLET DAILY Lowery, April G, MD Taking Active   metoprolol succinate (TOPROL-XL) 50 MG 24 hr tablet [id number] No TAKE 1 TABLET BY MOUTH EVERY DAY WITH OR IMMEDIATELY FOLLOWING A MEAL Lowery, April G, MD Taking Active   pantoprazole (PROTONIX) 40 MG tablet [id number] No Take 1 tablet (  40 mg total) by mouth daily. Lowery, April G, MD Taking Active   sacubitril-valsartan [facility]) 24-26 MG [id number] No Take 1 tablet by mouth 2 (two) times daily. Lowery, April G, MD Taking Active   sertraline (ZOLOFT) 50 MG tablet [id number] No TAKE 1 TABLET BY MOUTH EVERY DAY Lowery, April G, MD Taking Active             SDOH:  (Social Determinants of Health) assessments and interventions performed: Yes SDOH Interventions    Flowsheet Row Care Coordination from 08/28/2022 in [facility] Care Coordination from 05/24/2022 in [facility] Clinical Support from 11/14/2021 in [facility] [place] Clinical Support from 10/06/2020 in [facility] [place] Patient Outreach Telephone from 06/27/2020 in [facility] Care Coordination Patient Outreach Telephone from 11/04/2019 in [facility] Care Coordination  SDOH Interventions        Food Insecurity Interventions Intervention Not Indicated Intervention Not Indicated Intervention Not Indicated -- Intervention Not Indicated Intervention Not Indicated  Housing Interventions Intervention Not Indicated Intervention Not Indicated Intervention Not Indicated Intervention Not Indicated Intervention Not Indicated Intervention Not Indicated  Transportation Interventions -- Intervention Not Indicated Intervention Not Indicated Intervention Not Indicated Intervention Not Indicated Intervention Not Indicated  Utilities Interventions -- Intervention Not Indicated -- -- -- --   Financial Strain Interventions -- -- Intervention Not Indicated -- Intervention Not Indicated --  Physical Activity Interventions -- Intervention Not Indicated Intervention Not Indicated Intervention Not Indicated -- --  Stress Interventions -- -- Intervention Not Indicated -- Intervention Not Indicated --  Social Connections Interventions -- -- Intervention Not Indicated Intervention Not Indicated -- Other (Comment)  [spends time with son, daughter in law]       Medication Assistance:  Jardiance/Entresto coverage obtained through [facility] Cardiomyopathy medication assistance program.  Enrollment ends 04/24/2023  Medication Access: Within the past 30 days, how often has patient missed a dose of medication? None Is a pillbox or other method used to improve adherence? Yes  Factors that may affect medication adherence? adverse effects of medications Are meds synced by current pharmacy? No  Are meds delivered by current pharmacy? No  Does patient experience delays in picking up medications due to transportation concerns? No    Current pharmacy:  [id number] - [place], [place] - [address] [place]  [address] Phone: [phone] Fax: [phone]  [id number] - [place], [place] - [address] [place] [phone] Phone: [phone] Fax: [phone]  [id number] - [place], [place] - [address] [place] [address] Phone: [phone] Fax: [phone]   Compliance/Adherence/Medication fill history: Care Gaps: AWV - completed 11/14/2021, scheduled 11/19/2022 Last eye exam / retinopathy screening: 06/09/2021 Last diabetic foot exam: 06/18/2021 Tdap - never done Shingrix - never done Flu - never done Covid - overdue Pneumovax - postponed  Star-Rating Drugs: Jardiance 10mg  PDC 100% Entresto 24-26mg  PDC 47% LF 06/04/22 90DS   Assessment/Plan Hyperlipidemia:  (LDL goal < 70) -Uncontrolled (LDL 140 06/18/21) -Current treatment: Rosuvastatin 40mg  1 qd -not taking, refusal to take a statin Zetia 10mg  1 qd Appropriate, Effective, Safe, Accessible -Medications previously tried: None  -Current dietary patterns: limits fried greasy foods -Current exercise habits: walks daily for at least 30 min -Educated on Cholesterol goals;  Benefits of statin for ASCVD risk reduction; Importance of limiting foods high in cholesterol; Exercise  goal of 150 minutes per week; Strategies to manage statin-induced myalgias; -Counseled on diet and exercise extensively Recommended to continue zetia. -Has not taken her statin in 2 years due to joint pain. Discussed the addition of CoQ10 to aid in myopathy/joint pain experienced with statin use. Patient declined -Discussed repatha/praluent in place of statin/zetia and access to getting for free, patient declined  Heart Failure (Goal: manage symptoms and prevent exacerbations) -Controlled -Last ejection fraction: 40-45% (Date: 10/07/19) -HF type: HFmrEF (mildly reduced EF 41-49%) -NYHA Class: III (marked limitation of activity) -AHA HF Stage: C (Heart disease and symptoms present) -Current treatment: Lasix 20mg  1 qd Appropriate, Effective, Safe, Accessible Metoprolol XL 50mg  1 qd Appropriate, Effective, Safe, Accessible Entresto 24-26mg  1 BID Appropriate, Effective, Safe, Accessible Jardiance 10mg  1 qd Appropriate, Effective, Safe, Accessible -Medications previously tried: None  -Current home BP/HR readings: in [place] for travels so not checking currently, tries to check once a week at home and reports WNL when she left for trip -Current home daily weights: Checking daily and denies any weight fluctuation concerns -Current dietary habits: mindful of salt intake -Current exercise habits: Walks when she can, is walking around [place] for her trip right now -Educated on Benefits of medications for managing symptoms and  prolonging life Importance of weighing daily; if you gain more than 3 pounds in one day or 5 pounds in one week, contact office Importance of blood pressure control -Recommended continue medication therapy, daily weight checks and weekly BP monitoring    Sherrill Raring Clinical Pharmacist [phone]

## 2022-08-27 ENCOUNTER — Telehealth: Payer: Self-pay

## 2022-08-27 NOTE — Progress Notes (Signed)
Care Management & Coordination Services Pharmacy Team  Reason for Encounter: Appointment Reminder  Contacted patient to confirm telephone appointment with [name], PharmD on 08/28/2022 at 1:30. Spoke with patient on 08/27/2022    Care Gaps: AWV - completed 11/14/2021 Last eye exam - 06/09/2021 Last foot exam - 06/18/2021 Last BP - 120/62 on 07/16/2022 Last A1C - 6.8 on 05/01/2022 Tdap - never done Shingrix - never done Covid - overdue Pneumovax - postponed   Star Rating Drugs:  Jardiance 10 mg - [facility]   Inetta Fermo [facility]  Clinical Pharmacist Assistant [phone]

## 2022-08-28 ENCOUNTER — Ambulatory Visit: Payer: Medicare HMO

## 2022-09-04 ENCOUNTER — Ambulatory Visit (INDEPENDENT_AMBULATORY_CARE_PROVIDER_SITE_OTHER): Payer: Medicare HMO

## 2022-09-04 DIAGNOSIS — I255 Ischemic cardiomyopathy: Secondary | ICD-10-CM

## 2022-09-10 LAB — CUP PACEART REMOTE DEVICE CHECK
Date Time Interrogation Session: 20240618135624
Implantable Lead Connection Status: 753985
Implantable Lead Connection Status: 753985
Implantable Lead Implant Date: 20191121
Implantable Lead Implant Date: 20191121
Implantable Lead Location: 753859
Implantable Lead Location: 753860
Implantable Lead Model: 4076
Implantable Lead Model: 4076
Implantable Pulse Generator Implant Date: 20191121

## 2022-09-12 ENCOUNTER — Other Ambulatory Visit: Payer: Self-pay | Admitting: Family Medicine

## 2022-09-23 ENCOUNTER — Encounter: Payer: Self-pay | Admitting: Family Medicine

## 2022-09-23 ENCOUNTER — Ambulatory Visit (INDEPENDENT_AMBULATORY_CARE_PROVIDER_SITE_OTHER): Payer: Medicare HMO

## 2022-09-23 ENCOUNTER — Ambulatory Visit (INDEPENDENT_AMBULATORY_CARE_PROVIDER_SITE_OTHER): Payer: Medicare HMO | Admitting: Family Medicine

## 2022-09-23 VITALS — BP 120/70 | HR 73 | Temp 97.7°F | Resp 16 | Ht 65.0 in | Wt 176.5 lb

## 2022-09-23 DIAGNOSIS — Z950 Presence of cardiac pacemaker: Secondary | ICD-10-CM | POA: Diagnosis not present

## 2022-09-23 DIAGNOSIS — R053 Chronic cough: Secondary | ICD-10-CM

## 2022-09-23 DIAGNOSIS — R0609 Other forms of dyspnea: Secondary | ICD-10-CM

## 2022-09-23 DIAGNOSIS — J310 Chronic rhinitis: Secondary | ICD-10-CM

## 2022-09-23 DIAGNOSIS — R059 Cough, unspecified: Secondary | ICD-10-CM | POA: Diagnosis not present

## 2022-09-23 MED ORDER — FLUTICASONE PROPIONATE 50 MCG/ACT NA SUSP
1.0000 | Freq: Every day | NASAL | 0 refills | Status: DC
Start: 2022-09-23 — End: 2023-01-24

## 2022-09-23 NOTE — Progress Notes (Signed)
ACUTE VISIT Chief Complaint  Patient presents with   Cough    X a month, no fever, mucus.    HPI: [name] is a 77 y.o. female, who is here today complaining of *** HPI  Review of Systems See other pertinent positives and negatives in HPI.  Current Outpatient Medications on File Prior to Visit  Medication Sig Dispense Refill   acetaminophen (TYLENOL) 650 MG CR tablet Take 650 mg by mouth every 8 (eight) hours as needed for pain.     aspirin EC 81 MG tablet Take 81 mg by mouth daily. Swallow whole.     cholecalciferol (VITAMIN D3) 25 MCG (1000 UT) tablet Take 1,000 Units by mouth daily.     clopidogrel (PLAVIX) 75 MG tablet TAKE 1 TABLET BY MOUTH EVERY DAY 90 tablet 3   Cyanocobalamin (B-12) 2500 MCG TABS Take 1 tablet by mouth daily.     diclofenac Sodium (VOLTAREN) 1 % GEL Apply 2 g topically 4 (four) times daily. 150 g 1   empagliflozin (JARDIANCE) 10 MG TABS tablet TAKE 1 TABLET BY MOUTH EVERY DAY BEFORE BREAKFAST 90 tablet 2   ezetimibe (ZETIA) 10 MG tablet TAKE 1 TABLET BY MOUTH EVERY DAY 90 tablet 3   furosemide (LASIX) 20 MG tablet TAKE 2 TABLETS BY MOUTH DAILY FOR 5 DAYS THEN 1 TABLET DAILY 90 tablet 1   metoprolol succinate (TOPROL-XL) 50 MG 24 hr tablet TAKE 1 TABLET BY MOUTH EVERY DAY WITH OR IMMEDIATELY FOLLOWING A MEAL 90 tablet 1   pantoprazole (PROTONIX) 40 MG tablet Take 1 tablet (40 mg total) by mouth daily. 90 tablet 3   sacubitril-valsartan (ENTRESTO) 24-26 MG Take 1 tablet by mouth 2 (two) times daily. 180 tablet 1   sertraline (ZOLOFT) 50 MG tablet TAKE 1 TABLET BY MOUTH EVERY DAY 90 tablet 1   No current facility-administered medications on file prior to visit.    Past Medical History:  Diagnosis Date   Acute ST segment elevation myocardial infarction [facility])    Arthritis    CAD (coronary artery disease) 02/15/2019   Cancer of left kidney (HCC)    Complete atrioventricular block (HCC)    DVT (deep venous thrombosis) (HCC) 02/15/2019   GI  bleeding    History of cerebellar stroke 02/15/2019   HLD (hyperlipidemia) 02/15/2019   HTN (hypertension) 02/15/2019   Ischemic cardiomyopathy    Melena    Pacemaker    Pulmonary thromboembolism (HCC) 02/15/2019   Allergies  Allergen Reactions   Penicillins Rash    Social History   Socioeconomic History   Marital status: Widowed    Spouse name: Not on file   Number of children: 4   Years of education: Not on file   Highest education level: 12th grade  Occupational History   Occupation: retired  Tobacco Use   Smoking status: Former   Smokeless tobacco: Never  Building services engineer Use: Never used  Substance and Sexual Activity   Alcohol use: Never   Drug use: Never   Sexual activity: Not on file    Comment: MARRIED  Other Topics Concern   Not on file  Social History Narrative   Not on file   Social Determinants of Health   Financial Resource Strain: Low Risk  (09/22/2022)   Overall Financial Resource Strain (CARDIA)    Difficulty of Paying Living Expenses: Not hard at all  Food Insecurity: No Food Insecurity (09/22/2022)   Hunger Vital Sign    Worried About  Running Out of Food in the Last Year: Never true    Ran Out of Food in the Last Year: Never true  Transportation Needs: No Transportation Needs (09/22/2022)   PRAPARE - Administrator, Civil Service (Medical): No    Lack of Transportation (Non-Medical): No  Physical Activity: Insufficiently Active (09/22/2022)   Exercise Vital Sign    Days of Exercise per Week: 7 days    Minutes of Exercise per Session: 10 min  Stress: No Stress Concern Present (09/22/2022)   [employer] of Occupational Health - Occupational Stress Questionnaire    Feeling of Stress : Not at all  Social Connections: Socially Isolated (09/22/2022)   Social Connection and Isolation Panel [NHANES]    Frequency of Communication with Friends and Family: Once a week    Frequency of Social Gatherings with Friends and Family: More  than three times a week    Attends Religious Services: Never    Database administrator or Organizations: No    Attends Banker Meetings: Never    Marital Status: Widowed    Vitals:   09/23/22 1216  BP: 120/70  Pulse: 73  Temp: 97.7 F (36.5 C)  SpO2: 95%   Body mass index is 29.37 kg/m.  Physical Exam Vitals and nursing note reviewed.  Constitutional:      General: She is not in acute distress.    Appearance: She is well-developed.  HENT:     Head: Normocephalic and atraumatic.     Mouth/Throat:     Mouth: Mucous membranes are moist.     Pharynx: Oropharynx is clear.  Eyes:     Conjunctiva/sclera: Conjunctivae normal.  Neck:     Vascular: No JVD.  Cardiovascular:     Rate and Rhythm: Normal rate and regular rhythm.     Heart sounds: No murmur heard. Pulmonary:     Effort: Pulmonary effort is normal. No respiratory distress.     Breath sounds: Normal breath sounds.  Abdominal:     Palpations: Abdomen is soft. There is no hepatomegaly or mass.     Tenderness: There is no abdominal tenderness.  Lymphadenopathy:     Cervical: No cervical adenopathy.  Skin:    General: Skin is warm.     Findings: No erythema or rash.  Neurological:     General: No focal deficit present.     Mental Status: She is alert and oriented to person, place, and time.     Cranial Nerves: No cranial nerve deficit.     Gait: Gait normal.     Comments: Gait mildly unstable, not assisted.  Psychiatric:     Comments: Well groomed, good eye contact.     ASSESSMENT AND PLAN: There are no diagnoses linked to this encounter.  No follow-ups on file.  Jahmani Staup G. [place], MD  [facility]. [place] office.  Discharge Instructions   None

## 2022-09-23 NOTE — Patient Instructions (Signed)
A few things to remember from today's visit:  Persistent cough - Plan: DG Chest 2 View  DOE (dyspnea on exertion)  Rhinitis, unspecified type - Plan: fluticasone (FLONASE) 50 MCG/ACT nasal spray Try Flonase nasal spray daily for 10-14 days and monitor for changes in cough.  If you need refills for medications you take chronically, please call your pharmacy. Do not use My Chart to request refills or for acute issues that need immediate attention. If you send a my chart message, it may take a few days to be addressed, specially if I am not in the office.  Please be sure medication list is accurate. If a new problem present, please set up appointment sooner than planned today.

## 2022-09-24 DIAGNOSIS — Z66 Do not resuscitate: Secondary | ICD-10-CM | POA: Diagnosis not present

## 2022-09-24 DIAGNOSIS — R4182 Altered mental status, unspecified: Secondary | ICD-10-CM | POA: Diagnosis not present

## 2022-09-24 DIAGNOSIS — I10 Essential (primary) hypertension: Secondary | ICD-10-CM | POA: Diagnosis not present

## 2022-09-24 DIAGNOSIS — C4441 Basal cell carcinoma of skin of scalp and neck: Secondary | ICD-10-CM | POA: Diagnosis not present

## 2022-09-24 DIAGNOSIS — I2510 Atherosclerotic heart disease of native coronary artery without angina pectoris: Secondary | ICD-10-CM | POA: Diagnosis not present

## 2022-09-24 DIAGNOSIS — Z955 Presence of coronary angioplasty implant and graft: Secondary | ICD-10-CM | POA: Diagnosis not present

## 2022-09-24 DIAGNOSIS — J9601 Acute respiratory failure with hypoxia: Secondary | ICD-10-CM | POA: Diagnosis not present

## 2022-09-24 DIAGNOSIS — R9431 Abnormal electrocardiogram [ECG] [EKG]: Secondary | ICD-10-CM | POA: Diagnosis not present

## 2022-09-24 DIAGNOSIS — I110 Hypertensive heart disease with heart failure: Secondary | ICD-10-CM | POA: Diagnosis not present

## 2022-09-24 DIAGNOSIS — R0902 Hypoxemia: Secondary | ICD-10-CM | POA: Diagnosis not present

## 2022-09-24 DIAGNOSIS — I959 Hypotension, unspecified: Secondary | ICD-10-CM | POA: Diagnosis not present

## 2022-09-24 DIAGNOSIS — E872 Acidosis, unspecified: Secondary | ICD-10-CM | POA: Diagnosis not present

## 2022-09-24 DIAGNOSIS — Z7902 Long term (current) use of antithrombotics/antiplatelets: Secondary | ICD-10-CM | POA: Diagnosis not present

## 2022-09-24 DIAGNOSIS — R0602 Shortness of breath: Secondary | ICD-10-CM | POA: Diagnosis not present

## 2022-09-24 DIAGNOSIS — R918 Other nonspecific abnormal finding of lung field: Secondary | ICD-10-CM | POA: Diagnosis not present

## 2022-09-24 DIAGNOSIS — R531 Weakness: Secondary | ICD-10-CM | POA: Diagnosis not present

## 2022-09-24 DIAGNOSIS — J810 Acute pulmonary edema: Secondary | ICD-10-CM | POA: Diagnosis not present

## 2022-09-24 DIAGNOSIS — I509 Heart failure, unspecified: Secondary | ICD-10-CM | POA: Diagnosis not present

## 2022-09-25 ENCOUNTER — Other Ambulatory Visit: Payer: Self-pay | Admitting: Family Medicine

## 2022-09-25 DIAGNOSIS — D72829 Elevated white blood cell count, unspecified: Secondary | ICD-10-CM | POA: Diagnosis not present

## 2022-10-01 NOTE — Progress Notes (Signed)
Remote pacemaker transmission.   

## 2022-10-07 ENCOUNTER — Other Ambulatory Visit: Payer: Self-pay | Admitting: Family Medicine

## 2022-10-08 NOTE — Progress Notes (Signed)
HPI: [name] is a 77 y.o. female, who is here today to follow on recent hospitalization.  The patient, April Lowery, presents today for hospital follow up. She was last seen on for persistent cough, which she reports have improved. 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  She was discharged home on 09/25/22. No TOC call.  HH services were not deemed necessary. No medication changes. She feels like she is back to baseline.  Her medications have not changed recently.  Chest x-ray: Prominent bilateral interstitial opacities. Head CT: No acute intracranial abnormality. EKG: Atrial-paced rhythm with prolonged AV conduction  T wave abnormality, consider lateral ischemia  When compared with ECG of 01-Aug-2021 12 02,  Inverted T waves have replaced nonspecific T wave abnormality in inferior leads  Nonspecific T wave abnormality, improved in anterior leads.  09/25/22:  Component Ref Range & Units 2 wk ago  WBC 4.40 - 11.00 10*3/uL 11.35 High   RBC 4.10 - 5.10 10*6/uL 4.97  Hemoglobin 12.3 - 15.3 g/dL 08.6  Hematocrit 57.8 - 44.6 % 42.8  Mean Corpuscular Volume (MCV) 80.0 - 96.0 fL 86.1  Mean Corpuscular Hemoglobin (MCH) 27.5 - 33.2 pg 28.3  Mean Corpuscular Hemoglobin Conc (MCHC) 33.0 - 37.0 g/dL 46.9 Low   Red Cell Distribution Width (RDW) 12.3 - 17.0 % 15.2  Platelet Count (PLT) 150 - 450 10*3/uL 191  Mean Platelet Volume (MPV) 6.8 - 10.2 fL 9.2  Neutrophils % % 65  Lymphocytes % % 20  Monocytes % % 8  Eosinophils % % 6  Basophils % % 1  Neutrophils Absolute 1.80 - 7.80 10*3/uL 7.40  Lymphocytes # 1.00 - 4.80 10*3/uL 2.30  Monocytes # 0.00 - 0.80 10*3/uL 0.90 High   Eosinophils # 0.00 - 0.50 10*3/uL 0.60 High   Basophils # 0.00 - 0.20 10*3/uL 0.10   Mg++ 2.1. Troponin 13, 19,22, and 17 (09/25/2022). Lactate 2.1.  Still having DOE, stable. She denies any  palpitations, dizziness, or chest pain associated with her shortness of breath. Denies orthopnea and PND. HFrEF: Her last echo in 09/2019 with LVEF 40 to 45% and grade 1 diastolic dysfunction. She is currently taking Jardiance 10 mg daily and Entresto 24-26 mg twice daily. She states that she has tried to arrange a f/u appt with Dr Ladona Ridgel but has not been able to do so. Completed AV block, s/p pacemaker is checked remotely.  Basic Metabolic Panel Order: [id number] Component Ref Range & Units 2 wk ago  Sodium 136 - 145 mmol/L 140  Potassium 3.4 - 4.5 mmol/L 4.0  Chloride 98 - 107 mmol/L 105  CO2 21 - 31 mmol/L 27  Anion Gap 6 - 14 mmol/L 8  Glucose, Random 70 - 99 mg/dL 244 High   Blood Urea Nitrogen (BUN) 7 - 25 mg/dL 17  Creatinine 0.10 - 2.72 mg/dL 5.36  eGFR >64 QI/HKV/4.25Z5 67   BNP mildly elevated at 469.  UA negative except for 2+ blood.  She is planning a trip to [place] to visit her sister 10/25/22 and inquires about her ability to fly and the state of her lungs.  Anxiety: She is on Sertraline 50 mg daily, which she states it is still helping. She has been on medication since 2018. No side effects reported.     09/23/2022   12:25 PM 07/16/2022    3:30 PM 05/01/2022    2:01 PM  11/14/2021    3:54 PM 07/30/2021    8:31 AM  Depression screen PHQ 2/9  Decreased Interest 0 0 0 0 0  Down, Depressed, Hopeless 0 0 0 0 0  PHQ - 2 Score 0 0 0 0 0   Review of Systems  Constitutional:  Positive for fatigue. Negative for chills and fever.  HENT:  Negative for nosebleeds and sore throat.   Respiratory:  Negative for wheezing.   Gastrointestinal:  Negative for abdominal pain, nausea and vomiting.  Endocrine: Negative for cold intolerance and heat intolerance.  Genitourinary:  Negative for decreased urine volume, dysuria and hematuria.  Neurological:  Negative for syncope and facial asymmetry.  Psychiatric/Behavioral:  Negative for confusion and hallucinations.   See other  pertinent positives and negatives in HPI.  Current Outpatient Medications on File Prior to Visit  Medication Sig Dispense Refill   acetaminophen (TYLENOL) 650 MG CR tablet Take 650 mg by mouth every 8 (eight) hours as needed for pain.     aspirin EC 81 MG tablet Take 81 mg by mouth daily. Swallow whole.     cholecalciferol (VITAMIN D3) 25 MCG (1000 UT) tablet Take 1,000 Units by mouth daily.     clopidogrel (PLAVIX) 75 MG tablet TAKE 1 TABLET BY MOUTH EVERY DAY 90 tablet 3   Cyanocobalamin (B-12) 2500 MCG TABS Take 1 tablet by mouth daily.     diclofenac Sodium (VOLTAREN) 1 % GEL Apply 2 g topically 4 (four) times daily. 150 g 1   empagliflozin (JARDIANCE) 10 MG TABS tablet TAKE 1 TABLET BY MOUTH EVERY DAY BEFORE BREAKFAST 90 tablet 2   ezetimibe (ZETIA) 10 MG tablet TAKE 1 TABLET BY MOUTH EVERY DAY 90 tablet 3   fluticasone (FLONASE) 50 MCG/ACT nasal spray Place 1 spray into both nostrils daily. 16 g 0   furosemide (LASIX) 20 MG tablet TAKE 2 TABLETS BY MOUTH DAILY FOR 5 DAYS THEN 1 TABLET DAILY 90 tablet 1   metoprolol succinate (TOPROL-XL) 50 MG 24 hr tablet TAKE 1 TABLET BY MOUTH EVERY DAY WITH OR IMMEDIATELY FOLLOWING A MEAL 90 tablet 1   pantoprazole (PROTONIX) 40 MG tablet TAKE 1 TABLET BY MOUTH EVERY DAY 90 tablet 3   sacubitril-valsartan (ENTRESTO) 24-26 MG Take 1 tablet by mouth 2 (two) times daily. 180 tablet 1   No current facility-administered medications on file prior to visit.    Past Medical History:  Diagnosis Date   Acute ST segment elevation myocardial infarction [facility])    Arthritis    CAD (coronary artery disease) 02/15/2019   Cancer of left kidney (HCC)    Complete atrioventricular block (HCC)    DVT (deep venous thrombosis) (HCC) 02/15/2019   GI bleeding    History of cerebellar stroke 02/15/2019   HLD (hyperlipidemia) 02/15/2019   HTN (hypertension) 02/15/2019   Ischemic cardiomyopathy    Melena    Pacemaker    Pulmonary thromboembolism (HCC) 02/15/2019    Allergies  Allergen Reactions   Penicillins Rash    Social History   Socioeconomic History   Marital status: Widowed    Spouse name: Not on file   Number of children: 4   Years of education: Not on file   Highest education level: 12th grade  Occupational History   Occupation: retired  Tobacco Use   Smoking status: Former   Smokeless tobacco: Never  Advertising account planner   Vaping status: Never Used  Substance and Sexual Activity   Alcohol use: Never   Drug use: Never  Sexual activity: Not on file    Comment: MARRIED  Other Topics Concern   Not on file  Social History Narrative   Not on file   Social Determinants of Health   Financial Resource Strain: Low Risk  (09/22/2022)   Overall Financial Resource Strain (CARDIA)    Difficulty of Paying Living Expenses: Not hard at all  Food Insecurity: Low Risk  (09/25/2022)   [facility]   Food vital sign    Within the past 12 months, you worried that your food would run out before you got money to buy more: Never true    Within the past 12 months, the food you bought just didn't last and you didn't have money to get more. : Never true  Transportation Needs: No Transportation Needs (09/25/2022)   [facility]   Transportation    In the past 12 months, has lack of reliable transportation kept you from medical appointments, meetings, work or from getting things needed for daily living? : No  Physical Activity: Insufficiently Active (09/22/2022)   Exercise Vital Sign    Days of Exercise per Week: 7 days    Minutes of Exercise per Session: 10 min  Stress: No Stress Concern Present (09/22/2022)   [employer] of Occupational Health - Occupational Stress Questionnaire    Feeling of Stress : Not at all  Social Connections: Socially Isolated (09/22/2022)   Social Connection and Isolation Panel [NHANES]    Frequency of Communication with Friends and Family: Once a week    Frequency  of Social Gatherings with Friends and Family: More than three times a week    Attends Religious Services: Never    Database administrator or Organizations: No    Attends Banker Meetings: Never    Marital Status: Widowed    Vitals:   10/09/22 1119  BP: 118/60  Pulse: 76  Resp: 16  Temp: 98 F (36.7 C)  SpO2: 95%   Body mass index is 29.5 kg/m.  Physical Exam Vitals and nursing note reviewed.  Constitutional:      General: She is not in acute distress.    Appearance: She is well-developed.  HENT:     Head: Normocephalic and atraumatic.     Mouth/Throat:     Mouth: Mucous membranes are moist.     Pharynx: Oropharynx is clear.  Eyes:     Conjunctiva/sclera: Conjunctivae normal.  Neck:     Vascular: No JVD.  Cardiovascular:     Rate and Rhythm: Normal rate and regular rhythm.     Heart sounds: No murmur heard. Pulmonary:     Effort: Pulmonary effort is normal. No respiratory distress.     Breath sounds: Normal breath sounds.  Abdominal:     Palpations: Abdomen is soft. There is no hepatomegaly or mass.     Tenderness: There is no abdominal tenderness.  Lymphadenopathy:     Cervical: No cervical adenopathy.  Skin:    General: Skin is warm.     Findings: No erythema or rash.     Comments: Scalp: Right-sided parietal wound healing well.   Neurological:     General: No focal deficit present.     Mental Status: She is alert and oriented to person, place, and time.     Cranial Nerves: No cranial nerve deficit.     Gait: Gait normal.     Comments: Gait mildly unstable, not assisted.  Psychiatric:  Mood and Affect: Mood and affect normal.   ASSESSMENT AND PLAN:  [name] was seen today for hospitalization follow-up.  Diagnoses and all orders for this visit:  HFrEF (heart failure with reduced ejection fraction) (HCC) Assessment & Plan: Recent hospitalization with mild exacerbation. She seems to be euvolemic today. Continue Entresto 24-26 mg  twice daily, metoprolol succinate 50 mg daily, and Jardiance 10 mg daily. Continue furosemide 20 mg daily and low-salt diet. Referral to cardiologist placed.  Orders: -     Ambulatory referral to Cardiology  Essential (primary) hypertension Assessment & Plan: BP is adequately controlled. Continue metoprolol succinate 50 mg daily and low-salt diet. Monitor BP at home.   DOE (dyspnea on exertion) Assessment & Plan: Problem has been stable. We discussed possible etiologies, some of her comorbidities could be contributing factors. Instructed about warning signs. Appointment with cardiologist will be arranged.  Abnormal CXR 09/23/22 negative. 09/24/22 with prominent bilateral interstitial opacities could represent pulmonary venous congestion or atypical infection. She does not have symptoms suggestive for an infectious process. Cough has improved. CXR in 2 weeks, before leaving to [place]. Instructed about warning signs.   -     DG Chest 2 View; Future  Anxiety disorder, unspecified type Assessment & Plan: Problem reported as well controlled. Continue Sertraline 50 mg daily.  Orders: -     Sertraline HCl; Take 1 tablet (50 mg total) by mouth daily.  Dispense: 90 tablet; Refill: 2   Return if symptoms worsen or fail to improve, for keep next appointment. CXR in 2-3 weeks.  Calliope Delangel G. [place], MD  [facility]. [place] office.

## 2022-10-09 ENCOUNTER — Ambulatory Visit (INDEPENDENT_AMBULATORY_CARE_PROVIDER_SITE_OTHER): Payer: Medicare HMO | Admitting: Family Medicine

## 2022-10-09 ENCOUNTER — Encounter: Payer: Self-pay | Admitting: Family Medicine

## 2022-10-09 VITALS — BP 118/60 | HR 76 | Temp 98.0°F | Resp 16 | Ht 65.0 in | Wt 177.2 lb

## 2022-10-09 DIAGNOSIS — R0609 Other forms of dyspnea: Secondary | ICD-10-CM

## 2022-10-09 DIAGNOSIS — F419 Anxiety disorder, unspecified: Secondary | ICD-10-CM

## 2022-10-09 DIAGNOSIS — I10 Essential (primary) hypertension: Secondary | ICD-10-CM

## 2022-10-09 DIAGNOSIS — I5020 Unspecified systolic (congestive) heart failure: Secondary | ICD-10-CM | POA: Diagnosis not present

## 2022-10-09 DIAGNOSIS — R9389 Abnormal findings on diagnostic imaging of other specified body structures: Secondary | ICD-10-CM | POA: Diagnosis not present

## 2022-10-09 MED ORDER — SERTRALINE HCL 50 MG PO TABS: 50.0000 mg | ORAL_TABLET | Freq: Every day | ORAL | 2 refills | Status: DC

## 2022-10-09 NOTE — Assessment & Plan Note (Signed)
 BP is adequately controlled. Continue metoprolol succinate 50 mg daily and low-salt diet. Monitor BP at home.

## 2022-10-09 NOTE — Assessment & Plan Note (Addendum)
Recent hospitalization with mild exacerbation. She seems to be euvolemic today. Continue Entresto 24-26 mg twice daily, metoprolol succinate 50 mg daily, and Jardiance 10 mg daily. Continue furosemide 20 mg daily and low-salt diet. Referral to cardiologist placed.

## 2022-10-09 NOTE — Assessment & Plan Note (Signed)
Problem has been stable. We discussed possible etiologies, some of her comorbidities could be contributing factors. Instructed about warning signs. Appointment with cardiologist will be arranged.

## 2022-10-09 NOTE — Patient Instructions (Signed)
A few things to remember from today's visit:  HFrEF (heart failure with reduced ejection fraction) (HCC) - Plan: Ambulatory referral to Cardiology  Essential (primary) hypertension  DOE (dyspnea on exertion)  Chest X ray last business day in July before traveling. No changes today.  If you need refills for medications you take chronically, please call your pharmacy. Do not use My Chart to request refills or for acute issues that need immediate attention. If you send a my chart message, it may take a few days to be addressed, specially if I am not in the office.  Please be sure medication list is accurate. If a new problem present, please set up appointment sooner than planned today.

## 2022-10-10 ENCOUNTER — Encounter: Payer: Self-pay | Admitting: Family Medicine

## 2022-10-10 NOTE — Assessment & Plan Note (Signed)
Problem reported as well controlled. Continue Sertraline 50 mg daily.

## 2022-10-17 ENCOUNTER — Encounter: Payer: Self-pay | Admitting: Family Medicine

## 2022-10-23 ENCOUNTER — Encounter (INDEPENDENT_AMBULATORY_CARE_PROVIDER_SITE_OTHER): Payer: Self-pay

## 2022-11-19 ENCOUNTER — Ambulatory Visit (INDEPENDENT_AMBULATORY_CARE_PROVIDER_SITE_OTHER): Payer: Medicare HMO | Admitting: Family Medicine

## 2022-11-19 NOTE — Progress Notes (Signed)
Error, pt caring for sick family member and requested to cancel per staff.

## 2022-11-20 ENCOUNTER — Other Ambulatory Visit: Payer: Self-pay

## 2022-11-20 MED ORDER — METOPROLOL SUCCINATE ER 50 MG PO TB24: 50.0000 mg | ORAL_TABLET | Freq: Every day | ORAL | 1 refills | Status: DC

## 2022-12-04 ENCOUNTER — Ambulatory Visit (INDEPENDENT_AMBULATORY_CARE_PROVIDER_SITE_OTHER): Payer: Medicare HMO

## 2022-12-04 DIAGNOSIS — I255 Ischemic cardiomyopathy: Secondary | ICD-10-CM | POA: Diagnosis not present

## 2022-12-04 DIAGNOSIS — I442 Atrioventricular block, complete: Secondary | ICD-10-CM

## 2022-12-04 LAB — CUP PACEART REMOTE DEVICE CHECK
Battery Remaining Longevity: 114 mo
Battery Voltage: 2.99 V
Brady Statistic AP VP Percent: 24.28 %
Brady Statistic AP VS Percent: 19.94 %
Brady Statistic AS VP Percent: 22 %
Brady Statistic AS VS Percent: 33.78 %
Brady Statistic RA Percent Paced: 44.56 %
Brady Statistic RV Percent Paced: 46.27 %
Date Time Interrogation Session: 20240911014540
Implantable Lead Connection Status: 753985
Implantable Lead Connection Status: 753985
Implantable Lead Implant Date: 20191121
Implantable Lead Implant Date: 20191121
Implantable Lead Location: 753859
Implantable Lead Location: 753860
Implantable Lead Model: 4076
Implantable Lead Model: 4076
Implantable Pulse Generator Implant Date: 20191121
Lead Channel Impedance Value: 323 Ohm
Lead Channel Impedance Value: 342 Ohm
Lead Channel Impedance Value: 418 Ohm
Lead Channel Impedance Value: 456 Ohm
Lead Channel Pacing Threshold Amplitude: 0.75 V
Lead Channel Pacing Threshold Amplitude: 0.875 V
Lead Channel Pacing Threshold Pulse Width: 0.4 ms
Lead Channel Pacing Threshold Pulse Width: 0.4 ms
Lead Channel Sensing Intrinsic Amplitude: 2 mV
Lead Channel Sensing Intrinsic Amplitude: 2 mV
Lead Channel Sensing Intrinsic Amplitude: 31.625 mV
Lead Channel Sensing Intrinsic Amplitude: 31.625 mV
Lead Channel Setting Pacing Amplitude: 1.5 V
Lead Channel Setting Pacing Amplitude: 2 V
Lead Channel Setting Pacing Pulse Width: 0.4 ms
Lead Channel Setting Sensing Sensitivity: 1.2 mV
Zone Setting Status: 755011
Zone Setting Status: 755011

## 2022-12-20 NOTE — Progress Notes (Signed)
Remote pacemaker transmission.   

## 2022-12-30 NOTE — Telephone Encounter (Signed)
Pt will be here tomorrow morning (12/31/22) for her xrays.

## 2022-12-31 ENCOUNTER — Other Ambulatory Visit: Payer: Medicare HMO

## 2022-12-31 ENCOUNTER — Ambulatory Visit: Payer: Medicare HMO

## 2022-12-31 DIAGNOSIS — R059 Cough, unspecified: Secondary | ICD-10-CM | POA: Diagnosis not present

## 2022-12-31 DIAGNOSIS — I2510 Atherosclerotic heart disease of native coronary artery without angina pectoris: Secondary | ICD-10-CM | POA: Diagnosis not present

## 2022-12-31 DIAGNOSIS — R9389 Abnormal findings on diagnostic imaging of other specified body structures: Secondary | ICD-10-CM

## 2023-01-08 ENCOUNTER — Telehealth: Payer: Self-pay | Admitting: Family Medicine

## 2023-01-08 DIAGNOSIS — I5020 Unspecified systolic (congestive) heart failure: Secondary | ICD-10-CM

## 2023-01-08 MED ORDER — ENTRESTO 24-26 MG PO TABS: 1.0000 | ORAL_TABLET | Freq: Two times a day (BID) | ORAL | 1 refills | Status: DC

## 2023-01-08 NOTE — Telephone Encounter (Signed)
  Requesting new script/refill sacubitril-valsartan (ENTRESTO) 24-26 MG  faxed to [facility] in [place] [phone]

## 2023-01-24 ENCOUNTER — Encounter: Payer: Self-pay | Admitting: Family Medicine

## 2023-01-24 ENCOUNTER — Ambulatory Visit (INDEPENDENT_AMBULATORY_CARE_PROVIDER_SITE_OTHER): Payer: Medicare HMO | Admitting: Family Medicine

## 2023-01-24 VITALS — BP 120/70 | HR 88 | Resp 16 | Ht 65.0 in | Wt 174.0 lb

## 2023-01-24 DIAGNOSIS — E1169 Type 2 diabetes mellitus with other specified complication: Secondary | ICD-10-CM

## 2023-01-24 DIAGNOSIS — Z7984 Long term (current) use of oral hypoglycemic drugs: Secondary | ICD-10-CM | POA: Diagnosis not present

## 2023-01-24 DIAGNOSIS — N1831 Chronic kidney disease, stage 3a: Secondary | ICD-10-CM

## 2023-01-24 DIAGNOSIS — E782 Mixed hyperlipidemia: Secondary | ICD-10-CM | POA: Diagnosis not present

## 2023-01-24 DIAGNOSIS — I10 Essential (primary) hypertension: Secondary | ICD-10-CM | POA: Diagnosis not present

## 2023-01-24 LAB — LIPID PANEL
Cholesterol: 226 mg/dL — ABNORMAL HIGH (ref 0–200)
HDL: 49.7 mg/dL (ref >39.00)
LDL Cholesterol: 141 mg/dL — ABNORMAL HIGH (ref 0–99)
NonHDL: 176.44
Total CHOL/HDL Ratio: 5
Triglycerides: 178 mg/dL — ABNORMAL HIGH (ref 0.0–149.0)
VLDL: 35.6 mg/dL (ref 0.0–40.0)

## 2023-01-24 LAB — BASIC METABOLIC PANEL
BUN: 16 mg/dL (ref 6–23)
CO2: 29 meq/L (ref 19–32)
Calcium: 10.1 mg/dL (ref 8.4–10.5)
Chloride: 101 meq/L (ref 96–112)
Creatinine, Ser: 1.08 mg/dL (ref 0.40–1.20)
GFR: 50.26 mL/min — ABNORMAL LOW (ref >60.00)
Glucose, Bld: 143 mg/dL — ABNORMAL HIGH (ref 70–99)
Potassium: 4.6 meq/L (ref 3.5–5.1)
Sodium: 140 meq/L (ref 135–145)

## 2023-01-24 LAB — HEMOGLOBIN A1C: Hgb A1c MFr Bld: 7.1 % — ABNORMAL HIGH (ref 4.6–6.5)

## 2023-01-24 NOTE — Assessment & Plan Note (Signed)
Last LDL 140 in 05/2021. Continue Zetia 10 mg daily. She has not tolerated statins. If LDL is not at goal we may need to consider PCSK9 inh.

## 2023-01-24 NOTE — Patient Instructions (Addendum)
A few things to remember from today's visit:  Type 2 diabetes mellitus with other specified complication, without long-term current use of insulin (HCC) - Plan: Basic metabolic panel, Hemoglobin A1c  Essential (primary) hypertension - Plan: Basic metabolic panel  Stage 3a chronic kidney disease (HCC)  Mixed hyperlipidemia - Plan: Lipid panel  No changes today. I will see you back in 6 months. Please be sure you have an appt to see your cardiologist in 06/2023.  If you need refills for medications you take chronically, please call your pharmacy. Do not use My Chart to request refills or for acute issues that need immediate attention. If you send a my chart message, it may take a few days to be addressed, specially if I am not in the office.  Please be sure medication list is accurate. If a new problem present, please set up appointment sooner than planned today.

## 2023-01-24 NOTE — Progress Notes (Signed)
HPI: April Lowery is a 77 y.o. female with a PMHx significant for CAD,CHB s/p pacemaker placement, CVA, HTN, DM II, CKD III, low back pain, HLD, and B12 deficiency here today for chronic disease management.  Last seen on 10/09/2022  Hypertension: Currently on metoprolol succinate 50 mg daily, and Entresto 24-26 mg bid. She also takes aspirin 81 mg daily, and Plavix 75 mg daily.  She has not been checking her BP at home.  Side effects: none She states that she does not see her cardiologist regularly.  Last visit with cardiology in 06/2021, it was recommended to follow-up with Dr. Ladona Ridgel in 12 months.  Negative for unusual or severe headache, visual changes, exertional chest pain, worsening dyspnea, palpitations,new focal weakness, or edema.  CKD III: She has no gross hematuria, foam in urine, or decreased urine output.  Lab Results  Component Value Date   NA 138 07/08/2022   CL 102 07/08/2022   K 3.9 07/08/2022   CO2 27 07/08/2022   BUN 15 07/08/2022   CREATININE 0.85 07/08/2022   GFR 67.25 07/08/2022   CALCIUM 9.5 07/08/2022   ALBUMIN 4.3 06/18/2021   GLUCOSE 125 (H) 07/08/2022   Hyperlipidemia: Currently on Zetia 10 mg daily.  Side effects from medication: none Lab Results  Component Value Date   CHOL 206 (H) 06/18/2021   HDL 44.10 06/18/2021   LDLCALC 71 10/08/2019   LDLDIRECT 140.0 06/18/2021   TRIG 210.0 (H) 06/18/2021   CHOLHDL 5 06/18/2021   Diabetes Mellitus II: Since 01/2018 with a hemoglobin A1c of 7.8. - Checking BG at home: She says she has not been checking her BG at home.  - Medications: Currently on Jardiance 10 mg daily.   - foot exam:05/2021. - Negative for symptoms of hypoglycemia, polyuria, polydipsia, numbness extremities, foot ulcers/trauma  Lab Results  Component Value Date   HGBA1C 6.8 (H) 05/01/2022   Lab Results  Component Value Date   MICROALBUR <0.7 05/01/2022   Review of Systems  Constitutional:  Negative for chills and  fever.  HENT:  Negative for nosebleeds and sore throat.   Respiratory:  Negative for cough and wheezing.   Gastrointestinal:  Negative for abdominal pain, nausea and vomiting.  Skin:  Negative for rash.  Neurological:  Negative for syncope and facial asymmetry.  Psychiatric/Behavioral:  Negative for confusion and hallucinations.   See other pertinent positives and negatives in HPI.  Current Outpatient Medications on File Prior to Visit  Medication Sig Dispense Refill   acetaminophen (TYLENOL) 650 MG CR tablet Take 650 mg by mouth every 8 (eight) hours as needed for pain.     aspirin EC 81 MG tablet Take 81 mg by mouth daily. Swallow whole.     cholecalciferol (VITAMIN D3) 25 MCG (1000 UT) tablet Take 1,000 Units by mouth daily.     clopidogrel (PLAVIX) 75 MG tablet TAKE 1 TABLET BY MOUTH EVERY DAY 90 tablet 3   Cyanocobalamin (B-12) 2500 MCG TABS Take 1 tablet by mouth daily.     diclofenac Sodium (VOLTAREN) 1 % GEL Apply 2 g topically 4 (four) times daily. 150 g 1   empagliflozin (JARDIANCE) 10 MG TABS tablet TAKE 1 TABLET BY MOUTH EVERY DAY BEFORE BREAKFAST 90 tablet 2   ezetimibe (ZETIA) 10 MG tablet TAKE 1 TABLET BY MOUTH EVERY DAY 90 tablet 3   furosemide (LASIX) 20 MG tablet TAKE 2 TABLETS BY MOUTH DAILY FOR 5 DAYS THEN 1 TABLET DAILY 90 tablet 1  metoprolol succinate (TOPROL-XL) 50 MG 24 hr tablet Take 1 tablet (50 mg total) by mouth daily. Take with or immediately following a meal. 90 tablet 1   pantoprazole (PROTONIX) 40 MG tablet TAKE 1 TABLET BY MOUTH EVERY DAY 90 tablet 3   sacubitril-valsartan (ENTRESTO) 24-26 MG Take 1 tablet by mouth 2 (two) times daily. 180 tablet 1   sertraline (ZOLOFT) 50 MG tablet Take 1 tablet (50 mg total) by mouth daily. 90 tablet 2   No current facility-administered medications on file prior to visit.    Past Medical History:  Diagnosis Date   Acute ST segment elevation myocardial infarction [facility])    Arthritis    CAD (coronary artery disease)  02/15/2019   Cancer of left kidney (HCC)    Complete atrioventricular block (HCC)    DVT (deep venous thrombosis) (HCC) 02/15/2019   GI bleeding    History of cerebellar stroke 02/15/2019   HLD (hyperlipidemia) 02/15/2019   HTN (hypertension) 02/15/2019   Ischemic cardiomyopathy    Melena    Pacemaker    Pulmonary thromboembolism (HCC) 02/15/2019   Allergies  Allergen Reactions   Penicillins Rash    Social History   Socioeconomic History   Marital status: Widowed    Spouse name: Not on file   Number of children: 4   Years of education: Not on file   Highest education level: 12th grade  Occupational History   Occupation: retired  Tobacco Use   Smoking status: Former   Smokeless tobacco: Never  Advertising account planner   Vaping status: Never Used  Substance and Sexual Activity   Alcohol use: Never   Drug use: Never   Sexual activity: Not on file    Comment: MARRIED  Other Topics Concern   Not on file  Social History Narrative   Not on file   Social Determinants of Health   Financial Resource Strain: Low Risk  (01/22/2023)   Overall Financial Resource Strain (CARDIA)    Difficulty of Paying Living Expenses: Not very hard  Food Insecurity: No Food Insecurity (01/22/2023)   Hunger Vital Sign    Worried About Running Out of Food in the Last Year: Never true    Ran Out of Food in the Last Year: Never true  Transportation Needs: No Transportation Needs (01/22/2023)   PRAPARE - Administrator, Civil Service (Medical): No    Lack of Transportation (Non-Medical): No  Physical Activity: Insufficiently Active (01/22/2023)   Exercise Vital Sign    Days of Exercise per Week: 7 days    Minutes of Exercise per Session: 10 min  Stress: No Stress Concern Present (01/22/2023)   [employer] of Occupational Health - Occupational Stress Questionnaire    Feeling of Stress : Only a little  Social Connections: Socially Isolated (01/22/2023)   Social Connection and Isolation  Panel [NHANES]    Frequency of Communication with Friends and Family: Twice a week    Frequency of Social Gatherings with Friends and Family: Three times a week    Attends Religious Services: Never    Active Member of Clubs or Organizations: No    Attends Banker Meetings: Not on file    Marital Status: Widowed    Vitals:   01/24/23 0943  BP: 120/70  Pulse: 88  Resp: 16  SpO2: 95%   Body mass index is 28.96 kg/m.  Physical Exam Vitals and nursing note reviewed.  Constitutional:      General: She is not in acute  distress.    Appearance: She is well-developed.  HENT:     Head: Normocephalic and atraumatic.     Mouth/Throat:     Mouth: Mucous membranes are moist.     Pharynx: Oropharynx is clear.  Eyes:     Conjunctiva/sclera: Conjunctivae normal.  Cardiovascular:     Rate and Rhythm: Normal rate and regular rhythm.     Pulses:          Dorsalis pedis pulses are 2+ on the right side and 2+ on the left side.     Heart sounds: No murmur heard. Pulmonary:     Effort: Pulmonary effort is normal. No respiratory distress.     Breath sounds: Normal breath sounds.  Abdominal:     Palpations: Abdomen is soft. There is no hepatomegaly or mass.     Tenderness: There is no abdominal tenderness.  Musculoskeletal:     Right lower leg: No edema.     Left lower leg: No edema.  Lymphadenopathy:     Cervical: No cervical adenopathy.  Skin:    General: Skin is warm.     Findings: No erythema or rash.  Neurological:     General: No focal deficit present.     Mental Status: She is alert and oriented to person, place, and time.     Comments: Otherwise stable gait, mildly antalgic, not assisted.  Psychiatric:        Mood and Affect: Mood and affect normal.    Diabetic Foot Exam - Simple   Simple Foot Form Diabetic Foot exam was performed with the following findings: Yes 01/24/2023 10:20 AM  Visual Inspection See comments: Yes Sensation Testing See comments:  Yes Pulse Check Posterior Tibialis and Dorsalis pulse intact bilaterally: Yes Comments Small bunions, decreased monofilament in some areas around toes bilateral.    ASSESSMENT AND PLAN:  April Lowery was seen today for chronic follow up.   Orders Placed This Encounter  Procedures   Basic metabolic panel   Hemoglobin A1c   Lipid panel   Lab Results  Component Value Date   NA 140 01/24/2023   CL 101 01/24/2023   K 4.6 01/24/2023   CO2 29 01/24/2023   BUN 16 01/24/2023   CREATININE 1.08 01/24/2023   GFR 50.26 (L) 01/24/2023   CALCIUM 10.1 01/24/2023   ALBUMIN 4.3 06/18/2021   GLUCOSE 143 (H) 01/24/2023   Lab Results  Component Value Date   HGBA1C 7.1 (H) 01/24/2023   Lab Results  Component Value Date   CHOL 226 (H) 01/24/2023   HDL 49.70 01/24/2023   LDLCALC 141 (H) 01/24/2023   LDLDIRECT 140.0 06/18/2021   TRIG 178.0 (H) 01/24/2023   CHOLHDL 5 01/24/2023   Type 2 diabetes mellitus with other specified complication, without long-term current use of insulin (HCC) Assessment & Plan: HgA1C has been at goal, last hemoglobin A1c 6.8 in 04/2022. Continue Jardiance 10 mg daily. Further recommendation will be given according to hemoglobin A1c result. Annual eye exam and foot care to continue. F/U in 5-6 months.  Orders: -     Basic metabolic panel; Future -     Hemoglobin A1c; Future  Essential (primary) hypertension Assessment & Plan: BP is adequately controlled. Continue metoprolol succinate 50 mg daily and Entresto 24-26 mg twice daily (which she takes forHFrEF) as well as low-salt diet. Monitor BP at home. Eye exam is current.  Orders: -     Basic metabolic panel; Future  Stage 3a chronic kidney disease (HCC) Assessment &  Plan: Problem has improved,e GFR has been > 60 for the past couple months. Currently on Jardiance 10 mg for DM 2 and CHF, also on Entresto for CHF. Continue adequate hydration, low-salt diet, and avoidance of NSAIDs.   Mixed  hyperlipidemia Assessment & Plan: Last LDL 140 in 05/2021. Continue Zetia 10 mg daily. She has not tolerated statins. If LDL is not at goal we may need to consider PCSK9 inh.  Orders: -     Lipid panel; Future   Return in about 6 months (around 07/24/2023).  I, Rolla Etienne Wierda, acting as a scribe for Kenia Teagarden [place], MD., have documented all relevant documentation on the behalf of April Maynor [place], MD, as directed by  Anquan Azzarello [place], MD while in the presence of Sheryll Dymek [place], MD.   I, Shaylynne Lunt [place], MD, have reviewed all documentation for this visit. The documentation on 01/24/23 for the exam, diagnosis, procedures, and orders are all accurate and complete.  Imunique Samad G. [place], MD  [facility]. [place] office.

## 2023-01-24 NOTE — Assessment & Plan Note (Addendum)
BP is adequately controlled. Continue metoprolol succinate 50 mg daily and Entresto 24-26 mg twice daily (which she takes forHFrEF) as well as low-salt diet. Monitor BP at home. Eye exam is current.

## 2023-01-24 NOTE — Assessment & Plan Note (Signed)
HgA1C has been at goal, last hemoglobin A1c 6.8 in 04/2022. Continue Jardiance 10 mg daily. Further recommendation will be given according to hemoglobin A1c result. Annual eye exam and foot care to continue. F/U in 5-6 months.

## 2023-01-24 NOTE — Assessment & Plan Note (Addendum)
Problem has improved,e GFR has been > 60 for the past couple months. Currently on Jardiance 10 mg for DM 2 and CHF, also on Entresto for CHF. Continue adequate hydration, low-salt diet, and avoidance of NSAIDs.

## 2023-02-13 ENCOUNTER — Encounter (HOSPITAL_BASED_OUTPATIENT_CLINIC_OR_DEPARTMENT_OTHER): Payer: Self-pay

## 2023-02-13 ENCOUNTER — Emergency Department (HOSPITAL_BASED_OUTPATIENT_CLINIC_OR_DEPARTMENT_OTHER)
Admission: EM | Admit: 2023-02-13 | Discharge: 2023-02-13 | Disposition: A | Discharge status: Home / Self Care | Payer: Medicare HMO | Attending: Emergency Medicine | Admitting: Emergency Medicine

## 2023-02-13 ENCOUNTER — Other Ambulatory Visit: Payer: Self-pay

## 2023-02-13 ENCOUNTER — Emergency Department (HOSPITAL_BASED_OUTPATIENT_CLINIC_OR_DEPARTMENT_OTHER): Payer: Medicare HMO

## 2023-02-13 DIAGNOSIS — S299XXA Unspecified injury of thorax, initial encounter: Secondary | ICD-10-CM | POA: Diagnosis not present

## 2023-02-13 DIAGNOSIS — S20211A Contusion of right front wall of thorax, initial encounter: Secondary | ICD-10-CM | POA: Diagnosis not present

## 2023-02-13 DIAGNOSIS — R0789 Other chest pain: Secondary | ICD-10-CM | POA: Diagnosis not present

## 2023-02-13 DIAGNOSIS — I700 Atherosclerosis of aorta: Secondary | ICD-10-CM | POA: Diagnosis not present

## 2023-02-13 DIAGNOSIS — R0781 Pleurodynia: Secondary | ICD-10-CM | POA: Diagnosis not present

## 2023-02-13 DIAGNOSIS — I517 Cardiomegaly: Secondary | ICD-10-CM | POA: Diagnosis not present

## 2023-02-13 DIAGNOSIS — X58XXXA Exposure to other specified factors, initial encounter: Secondary | ICD-10-CM | POA: Insufficient documentation

## 2023-02-13 DIAGNOSIS — I2510 Atherosclerotic heart disease of native coronary artery without angina pectoris: Secondary | ICD-10-CM | POA: Diagnosis not present

## 2023-02-13 DIAGNOSIS — R079 Chest pain, unspecified: Secondary | ICD-10-CM

## 2023-02-13 DIAGNOSIS — Z7982 Long term (current) use of aspirin: Secondary | ICD-10-CM | POA: Diagnosis not present

## 2023-02-13 DIAGNOSIS — R071 Chest pain on breathing: Secondary | ICD-10-CM | POA: Diagnosis not present

## 2023-02-13 LAB — CBC
HCT: 44 % (ref 36.0–46.0)
Hemoglobin: 14.2 g/dL (ref 12.0–15.0)
MCH: 28 pg (ref 26.0–34.0)
MCHC: 32.3 g/dL (ref 30.0–36.0)
MCV: 86.8 fL (ref 80.0–100.0)
Platelets: 224 10*3/uL (ref 150–400)
RBC: 5.07 MIL/uL (ref 3.87–5.11)
RDW: 14.6 % (ref 11.5–15.5)
WBC: 6.6 10*3/uL (ref 4.0–10.5)
nRBC: 0 % (ref 0.0–0.2)

## 2023-02-13 LAB — TROPONIN I (HIGH SENSITIVITY): Troponin I (High Sensitivity): 9 ng/L (ref <18)

## 2023-02-13 LAB — BASIC METABOLIC PANEL
Anion gap: 10 (ref 5–15)
BUN: 12 mg/dL (ref 8–23)
CO2: 27 mmol/L (ref 22–32)
Calcium: 9.1 mg/dL (ref 8.9–10.3)
Chloride: 101 mmol/L (ref 98–111)
Creatinine, Ser: 0.91 mg/dL (ref 0.44–1.00)
GFR, Estimated: >60 mL/min (ref >60)
Glucose, Bld: 125 mg/dL — ABNORMAL HIGH (ref 70–99)
Potassium: 3.7 mmol/L (ref 3.5–5.1)
Sodium: 138 mmol/L (ref 135–145)

## 2023-02-13 MED ORDER — LIDOCAINE 5 % EX PTCH
1.0000 | MEDICATED_PATCH | CUTANEOUS | Status: DC
  Administered 2023-02-13: 1 via TRANSDERMAL
  Filled 2023-02-13: qty 1

## 2023-02-13 MED ORDER — HYDROCODONE-ACETAMINOPHEN 5-325 MG PO TABS
1.0000 | ORAL_TABLET | Freq: Once | ORAL | Status: AC
  Administered 2023-02-13: 1 via ORAL
  Filled 2023-02-13: qty 1

## 2023-02-13 MED ORDER — IOHEXOL 350 MG/ML SOLN
75.0000 mL | Freq: Once | INTRAVENOUS | Status: AC | PRN
  Administered 2023-02-13: 75 mL via INTRAVENOUS

## 2023-02-13 NOTE — ED Triage Notes (Signed)
States was reaching over her console on Saturday and "felt a pop". Pain with moving and breathing. Denies falls.

## 2023-02-13 NOTE — ED Provider Notes (Signed)
77 year old female history of prior DVT and PE no longer anticoagulation presenting for right rib pain.  About a week ago she bent over her center console in her car.  Since then she has had pain over her right anterior lower ribs.  Worse with palpation but typically gets better when she has pain like this.  She feels the pain is somewhat deeper and pleuritic.  Not the same as her prior PE.  She is concerned because something is going on.  Does not feel short of breath.  No chest pain at rest.  No nausea or vomiting.  She is tender although no skin changes.  However given history of prior DVT and state feels different than normal musculoskeletal pain she was worked up for PE, ACS without significant findings.  Discharged with return precautions.  Suspect musculoskeletal injury.   Laurence Spates, MD 02/16/23 [phone]

## 2023-02-13 NOTE — ED Provider Notes (Signed)
[place] [facility] Provider Note   CSN: [id number] Arrival date & time: 02/13/23  1405     History  Chief Complaint  Patient presents with   Chest Pain    Rib Pain    April Lowery is a 77 y.o. female, history of coronary artery disease, DVT, who presents to the ED secondary to right sided chest pain after bending over her consult, about a week ago, and feeling a crack.  She states since then it has hurts to take a deep breath, and she has had pain with movement.  Denies any nausea, vomiting, other chest pain, or shortness of breath.  Has been compliant with Plavix.     Home Medications Prior to Admission medications   Medication Sig Start Date End Date Taking? Authorizing Provider  acetaminophen (TYLENOL) 650 MG CR tablet Take 650 mg by mouth every 8 (eight) hours as needed for pain.    [provider]  aspirin EC 81 MG tablet Take 81 mg by mouth daily. Swallow whole.    [provider]  cholecalciferol (VITAMIN D3) 25 MCG (1000 UT) tablet Take 1,000 Units by mouth daily.    [provider]  clopidogrel (PLAVIX) 75 MG tablet TAKE 1 TABLET BY MOUTH EVERY DAY 07/12/22   Marinus Maw, MD  Cyanocobalamin (B-12) 2500 MCG TABS Take 1 tablet by mouth daily.    [provider]  diclofenac Sodium (VOLTAREN) 1 % GEL Apply 2 g topically 4 (four) times daily. 05/01/22   [place], Betty G, MD  empagliflozin (JARDIANCE) 10 MG TABS tablet TAKE 1 TABLET BY MOUTH EVERY DAY BEFORE BREAKFAST 05/02/22   [place], Betty G, MD  ezetimibe (ZETIA) 10 MG tablet TAKE 1 TABLET BY MOUTH EVERY DAY 09/13/22   [place], Betty G, MD  furosemide (LASIX) 20 MG tablet TAKE 2 TABLETS BY MOUTH DAILY FOR 5 DAYS THEN 1 TABLET DAILY 09/25/22   [place], Betty G, MD  metoprolol succinate (TOPROL-XL) 50 MG 24 hr tablet Take 1 tablet (50 mg total) by mouth daily. Take with or immediately following a meal. 11/20/22   [place], Betty G, MD  pantoprazole (PROTONIX) 40  MG tablet TAKE 1 TABLET BY MOUTH EVERY DAY 10/07/22   [place], Betty G, MD  sacubitril-valsartan (ENTRESTO) 24-26 MG Take 1 tablet by mouth 2 (two) times daily. 01/08/23   [place], Betty G, MD  sertraline (ZOLOFT) 50 MG tablet Take 1 tablet (50 mg total) by mouth daily. 10/09/22   [place], Betty G, MD      Allergies    Penicillins    Review of Systems   Review of Systems  Cardiovascular:  Positive for chest pain.    Physical Exam Updated Vital Signs BP 101/65   Pulse (!) 59   Temp (!) 97.4 F (36.3 C) (Oral)   Resp (!) 8   Ht 5\' 5"  (1.651 m)   Wt 78.9 kg   SpO2 91%   BMI 28.96 kg/m  Physical Exam Vitals and nursing note reviewed.  Constitutional:      General: She is not in acute distress.    Appearance: She is well-developed.  HENT:     Head: Normocephalic and atraumatic.  Eyes:     Conjunctiva/sclera: Conjunctivae normal.  Cardiovascular:     Rate and Rhythm: Normal rate and regular rhythm.     Heart sounds: No murmur heard. Pulmonary:     Effort: Pulmonary effort is normal. No respiratory distress.  Breath sounds: Normal breath sounds.  Chest:       Comments: Tenderness to palpation of right anterior chest wall, under right breast.  No step-offs, ecchymosis, or crepitus noted. Abdominal:     Palpations: Abdomen is soft.     Tenderness: There is no abdominal tenderness.  Musculoskeletal:        General: No swelling.     Cervical back: Neck supple.  Skin:    General: Skin is warm and dry.     Capillary Refill: Capillary refill takes less than 2 seconds.  Neurological:     Mental Status: She is alert.  Psychiatric:        Mood and Affect: Mood normal.     ED Results / Procedures / Treatments   Labs (all labs ordered are listed, but only abnormal results are displayed) Labs Reviewed  BASIC METABOLIC PANEL - Abnormal; Notable for the following components:      Result Value   Glucose, Bld 125 (*)    All other components within normal limits  CBC   TROPONIN I (HIGH SENSITIVITY)    EKG EKG Interpretation Date/Time:  Thursday February 13 2023 17:19:01 EST Ventricular Rate:  60 PR Interval:  215 QRS Duration:  187 QT Interval:  519 QTC Calculation: 519 R Axis:   -85  Text Interpretation: Paced rhythm with LBBB morphology Borderline prolonged PR interval Nonspecific IVCD with LAD LVH with secondary repolarization abnormality Confirmed by Fulton Reek [phone]) on 02/13/2023 5:30:41 PM  Radiology CT Angio Chest PE W/Cm &/Or Wo Cm  Result Date: 02/13/2023 CLINICAL DATA:  Chest wall pain, injury, pain with inspiration EXAM: CT ANGIOGRAPHY CHEST WITH CONTRAST TECHNIQUE: Multidetector CT imaging of the chest was performed using the standard protocol during bolus administration of intravenous contrast. Multiplanar CT image reconstructions and MIPs were obtained to evaluate the vascular anatomy. RADIATION DOSE REDUCTION: This exam was performed according to the departmental dose-optimization program which includes automated exposure control, adjustment of the mA and/or kV according to patient size and/or use of iterative reconstruction technique. CONTRAST:  75mL OMNIPAQUE IOHEXOL 350 MG/ML SOLN COMPARISON:  02/13/2023, 07/31/2021 FINDINGS: Cardiovascular: This is a technically adequate evaluation of the pulmonary vasculature. No filling defects or pulmonary emboli. The heart is enlarged without pericardial effusion. Normal caliber of the thoracic aorta. Postsurgical changes from CABG. Atherosclerosis of the aorta and native coronary vessels. Mediastinum/Nodes: No enlarged mediastinal, hilar, or axillary lymph nodes. Thyroid gland, trachea, and esophagus demonstrate no significant findings. Lungs/Pleura: Dependent hypoventilatory changes within the lower lobes. No acute airspace disease, effusion, or pneumothorax. Central airways are patent. Upper Abdomen: No acute abnormality. Musculoskeletal: No acute or destructive bony abnormalities.  Reconstructed images demonstrate no additional findings. Review of the MIP images confirms the above findings. IMPRESSION: 1. No evidence of pulmonary embolus. 2. Cardiomegaly. 3. No acute intrathoracic process. 4. Aortic Atherosclerosis (ICD10-I70.0). Coronary artery atherosclerosis. Electronically Signed   By: Sharlet Salina M.D.   On: 02/13/2023 19:22   DG Ribs Unilateral W/Chest Right  Result Date: 02/13/2023 CLINICAL DATA:  Felt a pop, history of rib fracture.  Pain. EXAM: RIGHT RIBS AND CHEST - 3+ VIEW COMPARISON:  Chest radiograph 12/31/2022, chest CT 07/31/2021 FINDINGS: No evidence of acute fracture of right ribs. No definite healing/healed rib fracture. No focal rib abnormality. There is no evidence of pneumothorax or pleural effusion. Both lungs are clear. Heart size and mediastinal contours are within normal limits. Prior median sternotomy with left-sided pacemaker in place. IMPRESSION: No evidence of acute right rib  fracture. No acute chest findings. Electronically Signed   By: Narda Rutherford M.D.   On: 02/13/2023 17:12    Procedures Procedures    Medications Ordered in ED Medications  lidocaine (LIDODERM) 5 % 1 patch (1 patch Transdermal Patch Applied 02/13/23 1510)  HYDROcodone-acetaminophen (NORCO/VICODIN) 5-325 MG per tablet 1 tablet (1 tablet Oral Given 02/13/23 1510)  iohexol (OMNIPAQUE) 350 MG/ML injection 75 mL (75 mLs Intravenous Contrast Given 02/13/23 1814)    ED Course/ Medical Decision Making/ A&P                                 Medical Decision Making Patient is a 77 year old female, here for right chest pain, has been going on for the last week.  She states she came in, because she cannot tolerate the pain anymore.  She states she felt like a pop, when she reached over the console about a week ago.  And has not improved.  He discussed with Dr. Earlene Plater, she would like [place] to obtain x-ray of ribs, CTA PE, rule out a PE given history of DVT, PE, as well as troponins  given her cardiac history.  She is overall well-appearing.  Will give her Norco, lidocaine patch for relief.  Pain is pleuritic.  No upper respiratory symptoms  Amount and/or Complexity of Data Reviewed Labs: ordered.    Details: Unremarkable, troponin within normal limits Radiology: ordered.    Details: Chest x-ray with ribs, and CTA PE unremarkable except for mild cardiomegaly ECG/medicine tests:  Decision-making details documented in ED Course. Discussion of management or test interpretation with external provider(s): Discussed with patient, 77 years old, here for right-sided chest pain has been on for a week.  Occurred after she bent over, and felt her rib pop.  We obtain x-ray ribs, which were unremarkable, CTA PE, is unremarkable for PE, she is feeling better after the Norco, and the lidocaine patch.  We will have her follow-up with PCP, for further evaluation.  She is well-appearing likely represents rib contusion/strain.  Risk Prescription drug management.    Final Clinical Impression(s) / ED Diagnoses Final diagnoses:  Contusion of rib on right side, initial encounter  Chest pain, unspecified type    Rx / DC Orders ED Discharge Orders     None         Pete Pelt, PA 02/13/23 1937    Laurence Spates, MD 02/16/23 1453

## 2023-02-13 NOTE — Discharge Instructions (Addendum)
Please follow-up with your primary care doctor, your x-rays, and CTs are reassuring.  Your blood work is also reassuring.  Take Tylenol, ibuprofen for your pain control as well as use lidocaine patches to help with the pain.  You could also try heat and ice

## 2023-02-14 NOTE — Progress Notes (Unsigned)
  Electrophysiology Office Note:   ID:  April, Lowery 10-17-1945, MRN [id number]  Primary Cardiologist: None Electrophysiologist: Lewayne Bunting, MD  {Click to update primary MD,subspecialty MD or APP then REFRESH:1}    History of Present Illness:   April Lowery is a 77 y.o. female with h/o CHB, CHF, CAD s/p CABG, and HTN, seen today for acute visit due to SOB/edema.    Seen in ED 11/21 with rib pain after bending over and feeling a "crack". CXR without fracture. CTA PE negative with no other acute intrathoracic process.  Patient reports ***.  she denies chest pain, palpitations, dyspnea, PND, orthopnea, nausea, vomiting, dizziness, syncope, edema, weight gain, or early satiety.   Review of systems complete and found to be negative unless listed in HPI.   EP Information / Studies Reviewed:    EKG is not ordered today. EKG from 02/13/2023 reviewed which showed NSR at 60 bpm with IVCD at 187 ms.        PPM Interrogation-  reviewed in detail today,  See PACEART report.  Device History: Medtronic Dual Chamber PPM implanted 02/12/2018 for CHB  Echo 09/2019 LVEF 40-45%, grade 1 DD, trivial MR.   Physical Exam:   VS:  There were no vitals taken for this visit.   Wt Readings from Last 3 Encounters:  02/13/23 174 lb (78.9 kg)  01/24/23 174 lb (78.9 kg)  10/09/22 177 lb 4 oz (80.4 kg)     GEN: Well nourished, well developed in no acute distress NECK: No JVD; No carotid bruits CARDIAC: {EPRHYTHM:28826}, no murmurs, rubs, gallops RESPIRATORY:  Clear to auscultation without rales, wheezing or rhonchi  ABDOMEN: Soft, non-tender, non-distended EXTREMITIES:  No edema; No deformity   ASSESSMENT AND PLAN:    CHB s/p Medtronic PPM  Normal PPM function See Pace Art report No changes today  CAD s/p CABG No s/s ischemia   Chronic systolic CHF NYHA III symptoms Volume status *** Update echo. Though her QRS is non-LBBB, it is quite wide and may need to consider device upgrade if EF  is down.  Continue Jardiance 10 mg daily Continue lasix 20 mg daily Continue toprol 50 mg daily Continue Entresto 24/25 mg BID  {Click here to Review PMH, Prob List, Meds, Allergies, SHx, FHx  :1}   Disposition:   Follow up with {EPPROVIDERS:28135} {EPFOLLOW UP:28173}  Signed, Graciella Freer, PA-C

## 2023-02-17 ENCOUNTER — Encounter: Payer: Self-pay | Admitting: Family Medicine

## 2023-02-17 ENCOUNTER — Ambulatory Visit: Payer: Medicare HMO | Attending: Student | Admitting: Student

## 2023-02-17 ENCOUNTER — Encounter: Payer: Self-pay | Admitting: Student

## 2023-02-17 ENCOUNTER — Other Ambulatory Visit: Payer: Self-pay | Admitting: *Deleted

## 2023-02-17 VITALS — BP 126/60 | HR 60 | Ht 65.0 in | Wt 178.4 lb

## 2023-02-17 DIAGNOSIS — I10 Essential (primary) hypertension: Secondary | ICD-10-CM | POA: Diagnosis not present

## 2023-02-17 DIAGNOSIS — I442 Atrioventricular block, complete: Secondary | ICD-10-CM

## 2023-02-17 DIAGNOSIS — I5022 Chronic systolic (congestive) heart failure: Secondary | ICD-10-CM | POA: Diagnosis not present

## 2023-02-17 DIAGNOSIS — I2510 Atherosclerotic heart disease of native coronary artery without angina pectoris: Secondary | ICD-10-CM | POA: Diagnosis not present

## 2023-02-17 DIAGNOSIS — I255 Ischemic cardiomyopathy: Secondary | ICD-10-CM

## 2023-02-17 LAB — CUP PACEART INCLINIC DEVICE CHECK
Battery Remaining Longevity: 112 mo
Battery Voltage: 2.99 V
Brady Statistic AP VP Percent: 23.78 %
Brady Statistic AP VS Percent: 18.63 %
Brady Statistic AS VP Percent: 25.47 %
Brady Statistic AS VS Percent: 32.12 %
Brady Statistic RA Percent Paced: 42.66 %
Brady Statistic RV Percent Paced: 49.25 %
Date Time Interrogation Session: 20241125103216
Implantable Lead Connection Status: 753985
Implantable Lead Connection Status: 753985
Implantable Lead Implant Date: 20191121
Implantable Lead Implant Date: 20191121
Implantable Lead Location: 753859
Implantable Lead Location: 753860
Implantable Lead Model: 4076
Implantable Lead Model: 4076
Implantable Pulse Generator Implant Date: 20191121
Lead Channel Impedance Value: 361 Ohm
Lead Channel Impedance Value: 380 Ohm
Lead Channel Impedance Value: 456 Ohm
Lead Channel Impedance Value: 494 Ohm
Lead Channel Pacing Threshold Amplitude: 0.625 V
Lead Channel Pacing Threshold Amplitude: 0.875 V
Lead Channel Pacing Threshold Pulse Width: 0.4 ms
Lead Channel Pacing Threshold Pulse Width: 0.4 ms
Lead Channel Sensing Intrinsic Amplitude: 2.75 mV
Lead Channel Sensing Intrinsic Amplitude: 26.5 mV
Lead Channel Sensing Intrinsic Amplitude: 3.25 mV
Lead Channel Sensing Intrinsic Amplitude: 6 mV
Lead Channel Setting Pacing Amplitude: 1.5 V
Lead Channel Setting Pacing Amplitude: 2 V
Lead Channel Setting Pacing Pulse Width: 0.4 ms
Lead Channel Setting Sensing Sensitivity: 1.2 mV
Zone Setting Status: 755011
Zone Setting Status: 755011

## 2023-02-17 MED ORDER — SPIRONOLACTONE 25 MG PO TABS: 25.0000 mg | ORAL_TABLET | Freq: Every day | ORAL | 3 refills | Status: DC

## 2023-02-17 NOTE — Patient Instructions (Signed)
Medication Instructions:  Start spironolactone 25 mg daily *If you need a refill on your cardiac medications before your next appointment, please call your pharmacy*  Lab Work: BMET, BNP, CBC-TODAY BMET-in 2 weeks If you have labs (blood work) drawn today and your tests are completely normal, you will receive your results only by: MyChart Message (if you have MyChart) OR A paper copy in the mail If you have any lab test that is abnormal or we need to change your treatment, we will call you to review the results.  Testing/Procedures: Your physician has requested that you have an echocardiogram. Echocardiography is a painless test that uses sound waves to create images of your heart. It provides your doctor with information about the size and shape of your heart and how well your heart's chambers and valves are working. This procedure takes approximately one hour. There are no restrictions for this procedure. Please do NOT wear cologne, perfume, aftershave, or lotions (deodorant is allowed). Please arrive 15 minutes prior to your appointment time.  Please note: We ask at that you not bring children with you during ultrasound (echo/ vascular) testing. Due to room size and safety concerns, children are not allowed in the ultrasound rooms during exams. Our front office staff cannot provide observation of children in our lobby area while testing is being conducted. An adult accompanying a patient to their appointment will only be allowed in the ultrasound room at the discretion of the ultrasound technician under special circumstances. We apologize for any inconvenience.   Follow-Up: At [facility], you and your health needs are our priority.  As part of our continuing mission to provide you with exceptional heart care, we have created designated Provider Care Teams.  These Care Teams include your primary Cardiologist (physician) and Advanced Practice Providers (APPs -  Physician Assistants  and Nurse Practitioners) who all work together to provide you with the care you need, when you need it.  Your next appointment:   4 week(s)  Provider:   Casimiro Needle "Otilio Saber, PA-C

## 2023-02-25 ENCOUNTER — Encounter (HOSPITAL_BASED_OUTPATIENT_CLINIC_OR_DEPARTMENT_OTHER): Payer: Self-pay

## 2023-02-25 ENCOUNTER — Telehealth: Payer: Self-pay | Admitting: Physician Assistant

## 2023-02-25 ENCOUNTER — Emergency Department (HOSPITAL_BASED_OUTPATIENT_CLINIC_OR_DEPARTMENT_OTHER)
Admission: EM | Admit: 2023-02-25 | Discharge: 2023-02-25 | Disposition: A | Discharge status: Home / Self Care | Payer: Medicare HMO | Attending: Emergency Medicine | Admitting: Emergency Medicine

## 2023-02-25 DIAGNOSIS — I2510 Atherosclerotic heart disease of native coronary artery without angina pectoris: Secondary | ICD-10-CM | POA: Insufficient documentation

## 2023-02-25 DIAGNOSIS — I10 Essential (primary) hypertension: Secondary | ICD-10-CM | POA: Diagnosis not present

## 2023-02-25 DIAGNOSIS — R799 Abnormal finding of blood chemistry, unspecified: Secondary | ICD-10-CM | POA: Diagnosis not present

## 2023-02-25 DIAGNOSIS — Z950 Presence of cardiac pacemaker: Secondary | ICD-10-CM | POA: Diagnosis not present

## 2023-02-25 DIAGNOSIS — Z85528 Personal history of other malignant neoplasm of kidney: Secondary | ICD-10-CM | POA: Insufficient documentation

## 2023-02-25 DIAGNOSIS — R7989 Other specified abnormal findings of blood chemistry: Secondary | ICD-10-CM | POA: Diagnosis not present

## 2023-02-25 DIAGNOSIS — R899 Unspecified abnormal finding in specimens from other organs, systems and tissues: Secondary | ICD-10-CM

## 2023-02-25 LAB — CBC
Hematocrit: 45.1 % (ref 34.0–46.6)
Hemoglobin: 14.4 g/dL (ref 11.1–15.9)
MCH: 28.1 pg (ref 26.6–33.0)
MCHC: 31.9 g/dL (ref 31.5–35.7)
MCV: 88 fL (ref 79–97)
Platelets: 227 10*3/uL (ref 150–450)
RBC: 5.13 x10E6/uL (ref 3.77–5.28)
RDW: 14.2 % (ref 11.7–15.4)
WBC: 7.5 10*3/uL (ref 3.4–10.8)

## 2023-02-25 LAB — BASIC METABOLIC PANEL
Anion gap: 9 (ref 5–15)
BUN/Creatinine Ratio: 16 (ref 12–28)
BUN: 25 mg/dL (ref 8–27)
BUN: 14 mg/dL (ref 8–23)
CO2: 18 mmol/L — ABNORMAL LOW (ref 20–29)
CO2: 27 mmol/L (ref 22–32)
Calcium: 12.2 mg/dL — ABNORMAL HIGH (ref 8.7–10.3)
Calcium: 9.7 mg/dL (ref 8.9–10.3)
Chloride: 103 mmol/L (ref 98–111)
Creatinine, Ser: 1.58 mg/dL — ABNORMAL HIGH (ref 0.57–1.00)
Creatinine, Ser: 1.08 mg/dL — ABNORMAL HIGH (ref 0.44–1.00)
GFR, Estimated: 54 mL/min — ABNORMAL LOW (ref >60)
Glucose, Bld: 145 mg/dL — ABNORMAL HIGH (ref 70–99)
Glucose: 178 mg/dL — ABNORMAL HIGH (ref 70–99)
Potassium: 6.1 mmol/L (ref 3.5–5.2)
Potassium: 4.2 mmol/L (ref 3.5–5.1)
Sodium: 139 mmol/L (ref 135–145)
eGFR: 34 mL/min/{1.73_m2} — ABNORMAL LOW (ref >59)

## 2023-02-25 LAB — PRO B NATRIURETIC PEPTIDE: NT-Pro BNP: 828 pg/mL — ABNORMAL HIGH (ref 0–738)

## 2023-02-25 LAB — MAGNESIUM: Magnesium: 2.1 mg/dL (ref 1.7–2.4)

## 2023-02-25 NOTE — Discharge Instructions (Signed)
Your labs here today are normal. Please continue to follow with your Cardiology doctor for planned lab work in the future. Return with any new or worsening symptoms.

## 2023-02-25 NOTE — Telephone Encounter (Signed)
Received a critical lab of K 6.1. Unclear if this was from 02/17/23.   Unable to reach patient. I spoke with her son Casimiro Needle and instructed her to not take the spironolactone this morning. I advised to present to [facility] for a lab redraw. I instructed that she not drive to to possibility of heart arrhythmias.   He expressed understanding and will bring her to the ER this morning.

## 2023-02-25 NOTE — ED Provider Notes (Signed)
Emergency Department Provider Note   I have reviewed the triage vital signs and the nursing notes.   HISTORY  Chief Complaint Abnormal Lab   HPI April Lowery is a 77 y.o. female with past history reviewed below presents emergency department after she was called regarding abnormal lab work.  She was told that her potassium was high to 6.1 and advised to present to the emergency department.  She has had no complaints.  She has remained compliant with her new medications including spironolactone.  No shortness of breath, palpitations, chest pain specifically.    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 Respiratory: Denies shortness of breath. Gastrointestinal: No abdominal pain.  No nausea, no vomiting.  Musculoskeletal: Negative for back pain. Skin: Negative for rash. Neurological: Negative for headaches.   ____________________________________________   PHYSICAL EXAM:  VITAL SIGNS: ED Triage Vitals  Encounter Vitals Group     BP 02/25/23 0736 (!) 158/54     Pulse Rate 02/25/23 0736 64     Resp 02/25/23 0736 18     Temp 02/25/23 0736 97.7 F (36.5 C)     Temp Source 02/25/23 0736 Oral     SpO2 02/25/23 0736 95 %     Weight 02/25/23 0734 176 lb (79.8 kg)     Height 02/25/23 0734 5\' 5"  (1.651 m)   Constitutional: Alert and oriented. Well appearing and in no acute distress. Eyes: Conjunctivae are normal Head:  Atraumatic. Nose: No congestion/rhinnorhea. Mouth/Throat: Mucous membranes are moist.   Neck: No stridor.   Cardiovascular: Good peripheral circulation.  Respiratory: Normal respiratory effort.   Gastrointestinal: No distention.  Musculoskeletal: No gross deformities of extremities. Neurologic:  Normal speech and language.  Skin:  Skin is warm, dry and intact. No rash noted.   ____________________________________________   LABS (all labs ordered are listed, but only abnormal results are displayed)  Labs Reviewed  BASIC METABOLIC PANEL - Abnormal; Notable for the following components:      Result Value   Glucose, Bld 145 (*)    Creatinine, Ser 1.08 (*)    GFR, Estimated 54 (*)    All other components within normal limits  MAGNESIUM   ____________________________________________  EKG   EKG Interpretation Date/Time:  Tuesday February 25 2023 07:44:04 EST Ventricular Rate:  62 PR Interval:  314 QRS Duration:  187 QT Interval:  500 QTC Calculation: 508 R Axis:   -84  Text Interpretation: Sinus rhythm Prolonged PR interval Left bundle branch block Similar to Feb 17 2023 tracing Confirmed by Alona Bene [phone]) on 02/25/2023 7:46:21 AM       ____________________________________________   PROCEDURES  Procedure(s) performed:   Procedures  None ____________________________________________   INITIAL IMPRESSION / ASSESSMENT AND PLAN / ED COURSE  Pertinent labs & imaging results that were available during my care of the patient were reviewed by me and considered in my medical decision making (see chart for details).  This patient is Presenting for Evaluation of abnormal labs, which does require a range of treatment options, and is a complaint that involves a high risk of morbidity and mortality.  The Differential Diagnoses include hyperkalemia, med side effect, lab error, etc.  Critical Interventions-    Medications - No data to display  Reassessment after  intervention:     I did obtain Additional Historical Information from son at bedside.  I decided to review pertinent External Data, and in summary patient with Cardiology appointment on 11/25 where spironolactone was started 25 mg daily.   Clinical Laboratory Tests Ordered, included BMP with normal K at 4.2. No AKI. Mg normal.     Cardiac Monitor Tracing which shows NSR.    Medical Decision Making: Summary:  Patient presents to the emergency department with report of abnormal lab work.  She has labs from 11/25 in our system showing potassium of 6.1.  Sodium and chloride did not result.  This was a significant change from labs 12 days ago.  Possible lab error but will need redraw and EKG here.   Reevaluation with update and discussion with patient. K normal here. Continue home medication and any planned lab work scheduled by Cardiology.   Patient's presentation is most consistent with acute presentation with potential threat to life or bodily function.   Disposition: discharge  ____________________________________________  FINAL CLINICAL IMPRESSION(S) / ED DIAGNOSES  Final diagnoses:  Abnormal laboratory test    Note:  This document was prepared using Dragon voice recognition software and may include unintentional dictation errors.  Alona Bene, MD, [facility] Emergency Medicine    [name], Arlyss Repress, MD 02/25/23 [phone]

## 2023-02-25 NOTE — ED Triage Notes (Signed)
Got a call stating her potassium was 6.1. Denies any complaints.

## 2023-02-26 ENCOUNTER — Telehealth: Payer: Self-pay

## 2023-02-26 NOTE — Transitions of Care (Post Inpatient/ED Visit) (Signed)
02/26/2023  Name: April Lowery MRN: [id number] DOB: [birth year]  Today's TOC FU Call Status: Today's TOC FU Call Status:: Successful TOC FU Call Completed TOC FU Call Complete Date: 02/26/23 Patient's Name and Date of Birth confirmed.  Transition Care Management Follow-up Telephone Call Date of Discharge: 02/25/23 Discharge Facility: [facility] Type of Discharge: Emergency Department Reason for ED Visit: Other: (abnormal K+) How have you been since you were released from the hospital?: Better Any questions or concerns?: No  Items Reviewed: Did you receive and understand the discharge instructions provided?: Yes Medications obtained,verified, and reconciled?: Yes (Medications Reviewed) Any new allergies since your discharge?: No Dietary orders reviewed?: NA Do you have support at home?: Yes People in Home: child(ren), adult  Medications Reviewed Today: Medications Reviewed Today     Reviewed by Karena Addison, LPN (Licensed Practical Nurse) on 02/26/23 at 1211  Med List Status: <None>   Medication Order Taking? Sig Documenting Provider Last Dose Status Informant  acetaminophen (TYLENOL) 650 MG CR tablet [id number] No Take 650 mg by mouth every 8 (eight) hours as needed for pain. [provider] Taking Active Self  aspirin EC 81 MG tablet [id number] No Take 81 mg by mouth daily. Swallow whole. [provider] Taking Active   cholecalciferol (VITAMIN D3) 25 MCG (1000 UT) tablet [id number] No Take 1,000 Units by mouth daily. [provider] Taking Active Self  clopidogrel (PLAVIX) 75 MG tablet [id number] No TAKE 1 TABLET BY MOUTH EVERY DAY Marinus Maw, MD Taking Active   Cyanocobalamin (B-12) 2500 MCG TABS [id number] No Take 1 tablet by mouth daily. [provider] Taking Active Self  diclofenac Sodium (VOLTAREN) 1 % GEL [id number] No Apply 2 g topically 4 (four) times daily. [place], Betty G, MD Taking Active   empagliflozin (JARDIANCE)  10 MG TABS tablet [id number] No TAKE 1 TABLET BY MOUTH EVERY DAY BEFORE BREAKFAST [place], Betty G, MD Taking Active   ezetimibe (ZETIA) 10 MG tablet [id number] No TAKE 1 TABLET BY MOUTH EVERY DAY [place], Betty G, MD Taking Active   furosemide (LASIX) 20 MG tablet [id number] No TAKE 2 TABLETS BY MOUTH DAILY FOR 5 DAYS THEN 1 TABLET DAILY [place], Betty G, MD Taking Active   metoprolol succinate (TOPROL-XL) 50 MG 24 hr tablet [id number] No Take 1 tablet (50 mg total) by mouth daily. Take with or immediately following a meal. [place], Betty G, MD Taking Active   pantoprazole (PROTONIX) 40 MG tablet [id number] No TAKE 1 TABLET BY MOUTH EVERY DAY [place], Betty G, MD Taking Active   sacubitril-valsartan (ENTRESTO) 24-26 MG [id number] No Take 1 tablet by mouth 2 (two) times daily. [place], Betty G, MD Taking Active   sertraline (ZOLOFT) 50 MG tablet [id number] No Take 1 tablet (50 mg total) by mouth daily. [place], Betty G, MD Taking Active   spironolactone (ALDACTONE) 25 MG tablet [id number]  Take 1 tablet (25 mg total) by mouth daily. Graciella Freer, PA-C  Active             Home Care and Equipment/Supplies: Were Home Health Services Ordered?: NA Any new equipment or medical supplies ordered?: NA  Functional Questionnaire: Do you need assistance with bathing/showering or dressing?: No Do you need assistance with meal preparation?: No Do you need assistance with eating?: No Do you have difficulty maintaining continence: No Do you need assistance with getting out of bed/getting out of a chair/moving?: No Do you have difficulty managing or taking your medications?: No  Follow up  appointments reviewed: PCP Follow-up appointment confirmed?: Yes Date of PCP follow-up appointment?: 02/28/23 Follow-up Provider: [place] [facility] Follow-up appointment confirmed?: NA Do you need transportation to your follow-up appointment?: No Do you understand care options if your condition(s) worsen?:  Yes-patient verbalized understanding    SIGNATURE Karena Addison, LPN [facility] Nurse Health Advisor Direct Dial [phone]

## 2023-02-28 ENCOUNTER — Ambulatory Visit (INDEPENDENT_AMBULATORY_CARE_PROVIDER_SITE_OTHER): Payer: Medicare HMO | Admitting: Family Medicine

## 2023-02-28 ENCOUNTER — Encounter: Payer: Self-pay | Admitting: Family Medicine

## 2023-02-28 VITALS — BP 110/60 | HR 80 | Resp 16 | Ht 65.0 in | Wt 177.0 lb

## 2023-02-28 DIAGNOSIS — I2510 Atherosclerotic heart disease of native coronary artery without angina pectoris: Secondary | ICD-10-CM | POA: Diagnosis not present

## 2023-02-28 DIAGNOSIS — E875 Hyperkalemia: Secondary | ICD-10-CM | POA: Diagnosis not present

## 2023-02-28 DIAGNOSIS — N1831 Chronic kidney disease, stage 3a: Secondary | ICD-10-CM | POA: Diagnosis not present

## 2023-02-28 NOTE — Progress Notes (Signed)
ACUTE VISIT Chief Complaint  Patient presents with   Follow-up   HPI: April Lowery is a 77 y.o. female with a PMHx significant for CAD, CVA, HTN, DM II, HFrEF, CKD III, HLD and B12 deficiency, among others, who is here today for hospital follow up for abnormal labs.   Patient was sent to the ER on 12/3 because her potassium was 6.1. She says she is feeling very well since then.  She is scheduled for an echocardiogram and  potentially a cardiac catheterization.  She says she is still having SOB, but denies chest pain or palpitations.  She says she is adequately hydrated.   Review of Systems See other pertinent positives and negatives in HPI.  Current Outpatient Medications on File Prior to Visit  Medication Sig Dispense Refill   acetaminophen (TYLENOL) 650 MG CR tablet Take 650 mg by mouth every 8 (eight) hours as needed for pain.     aspirin EC 81 MG tablet Take 81 mg by mouth daily. Swallow whole.     cholecalciferol (VITAMIN D3) 25 MCG (1000 UT) tablet Take 1,000 Units by mouth daily.     clopidogrel (PLAVIX) 75 MG tablet TAKE 1 TABLET BY MOUTH EVERY DAY 90 tablet 3   Cyanocobalamin (B-12) 2500 MCG TABS Take 1 tablet by mouth daily.     empagliflozin (JARDIANCE) 10 MG TABS tablet TAKE 1 TABLET BY MOUTH EVERY DAY BEFORE BREAKFAST 90 tablet 2   ezetimibe (ZETIA) 10 MG tablet TAKE 1 TABLET BY MOUTH EVERY DAY 90 tablet 3   furosemide (LASIX) 20 MG tablet TAKE 2 TABLETS BY MOUTH DAILY FOR 5 DAYS THEN 1 TABLET DAILY 90 tablet 1   metoprolol succinate (TOPROL-XL) 50 MG 24 hr tablet Take 1 tablet (50 mg total) by mouth daily. Take with or immediately following a meal. 90 tablet 1   pantoprazole (PROTONIX) 40 MG tablet TAKE 1 TABLET BY MOUTH EVERY DAY 90 tablet 3   sacubitril-valsartan (ENTRESTO) 24-26 MG Take 1 tablet by mouth 2 (two) times daily. 180 tablet 1   sertraline (ZOLOFT) 50 MG tablet Take 1 tablet (50 mg total) by mouth daily. 90 tablet 2   spironolactone (ALDACTONE) 25 MG  tablet Take 1 tablet (25 mg total) by mouth daily. 90 tablet 3   No current facility-administered medications on file prior to visit.    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   HLD (hyperlipidemia) 02/15/2019   HTN (hypertension) 02/15/2019   Ischemic cardiomyopathy    Melena    Pacemaker    Pulmonary thromboembolism (HCC) 02/15/2019   Allergies  Allergen Reactions   Penicillins Rash    Social History   Socioeconomic History   Marital status: Widowed    Spouse name: Not on file   Number of children: 4   Years of education: Not on file   Highest education level: 12th grade  Occupational History   Occupation: retired  Tobacco Use   Smoking status: Former   Smokeless tobacco: Never  Advertising account planner   Vaping status: Never Used  Substance and Sexual Activity   Alcohol use: Never   Drug use: Never   Sexual activity: Not on file  Comment: MARRIED  Other Topics Concern   Not on file  Social History Narrative   Not on file   Social Determinants of Health   Financial Resource Strain: Low Risk  (01/22/2023)   Overall Financial Resource Strain (CARDIA)    Difficulty of Paying Living Expenses: Not very hard  Food Insecurity: No Food Insecurity (01/22/2023)   Hunger Vital Sign    Worried About Running Out of Food in the Last Year: Never true    Ran Out of Food in the Last Year: Never true  Transportation Needs: No Transportation Needs (01/22/2023)   PRAPARE - Administrator, Civil Service (Medical): No    Lack of Transportation (Non-Medical): No  Physical Activity: Insufficiently Active (01/22/2023)   Exercise Vital Sign    Days of Exercise per Week: 7 days    Minutes  of Exercise per Session: 10 min  Stress: No Stress Concern Present (01/22/2023)   [employer] of Occupational Health - Occupational Stress Questionnaire    Feeling of Stress : Only a little  Social Connections: Socially Isolated (01/22/2023)   Social Connection and Isolation Panel [NHANES]    Frequency of Communication with Friends and Family: Twice a week    Frequency of Social Gatherings with Friends and Family: Three times a week    Attends Religious Services: Never    Active Member of Clubs or Organizations: No    Attends Banker Meetings: Not on file    Marital Status: Widowed    Vitals:   02/28/23 1144  BP: 110/60  Pulse: 80  SpO2: 93%   Body mass index is 29.45 kg/m.  Physical Exam Vitals and nursing note reviewed.  Constitutional:      General: She is not in acute distress.    Appearance: She is well-developed.  HENT:     Head: Normocephalic and atraumatic.     Mouth/Throat:     Mouth: Mucous membranes are moist.     Pharynx: Oropharynx is clear.  Eyes:     Conjunctiva/sclera: Conjunctivae normal.  Cardiovascular:     Rate and Rhythm: Normal rate and regular rhythm.     Pulses:          Dorsalis pedis pulses are 2+ on the right side and 2+ on the left side.     Heart sounds: No murmur heard. Pulmonary:     Effort: Pulmonary effort is normal. No respiratory distress.     Breath sounds: Normal breath sounds.  Abdominal:     Palpations: Abdomen is soft. There is no hepatomegaly or mass.     Tenderness: There is no abdominal tenderness.  Musculoskeletal:     Right lower leg: No edema.     Left lower leg: No edema.  Lymphadenopathy:     Cervical: No cervical adenopathy.  Skin:    General: Skin is warm.     Findings: No erythema or rash.  Neurological:     General: No focal deficit present.     Mental Status: She is alert and oriented to person, place, and time.     Cranial Nerves: No cranial nerve deficit.     Gait: Gait normal.   Psychiatric:     Comments: Well groomed, good eye contact.     ASSESSMENT AND PLAN:  April Lowery was seen today for hospital follow up.  There are no diagnoses linked to this encounter.  No follow-ups on file.  Trula Ore, acting as a Neurosurgeon for Mitsuye Schrodt [place],  MD., have documented all relevant documentation on the behalf of Shavone Nevers [place], MD, as directed by  Elizabelle Fite [place], MD while in the presence of Audianna Landgren [place], MD.   I, Suanne Marker, have reviewed all documentation for this visit. The documentation on 02/28/23 for the exam, diagnosis, procedures, and orders are all accurate and complete.  Donavin Audino G. [place], MD  [facility]. [place] office.  Discharge Instructions   None

## 2023-03-01 NOTE — Assessment & Plan Note (Addendum)
She is not having chest pain but DOE, which has been stable for some time. Possibility of having another cardiac cath has been discuss and will be decided depending of echo results. On Metoprolol succinate, aspirin, and Plavix.

## 2023-03-01 NOTE — Assessment & Plan Note (Signed)
Problem has been stable, last e GFR 54 and Cr 1.08. Currently on Jardiance 10 mg for DM 2 and CHF, also on Entresto for CHF. Stressed the importance of adequate hydration, low-salt diet, and continue avoidance of NSAIDs.

## 2023-03-05 ENCOUNTER — Ambulatory Visit (INDEPENDENT_AMBULATORY_CARE_PROVIDER_SITE_OTHER): Payer: Medicare HMO

## 2023-03-05 DIAGNOSIS — I255 Ischemic cardiomyopathy: Secondary | ICD-10-CM | POA: Diagnosis not present

## 2023-03-05 LAB — CUP PACEART REMOTE DEVICE CHECK
Battery Remaining Longevity: 110 mo
Battery Voltage: 2.99 V
Brady Statistic AP VP Percent: 43.44 %
Brady Statistic AP VS Percent: 0.03 %
Brady Statistic AS VP Percent: 55.73 %
Brady Statistic AS VS Percent: 0.81 %
Brady Statistic RA Percent Paced: 43.43 %
Brady Statistic RV Percent Paced: 99.17 %
Date Time Interrogation Session: 20241210182817
Implantable Lead Connection Status: 753985
Implantable Lead Connection Status: 753985
Implantable Lead Implant Date: 20191121
Implantable Lead Implant Date: 20191121
Implantable Lead Location: 753859
Implantable Lead Location: 753860
Implantable Lead Model: 4076
Implantable Lead Model: 4076
Implantable Pulse Generator Implant Date: 20191121
Lead Channel Impedance Value: 342 Ohm
Lead Channel Impedance Value: 361 Ohm
Lead Channel Impedance Value: 418 Ohm
Lead Channel Impedance Value: 437 Ohm
Lead Channel Pacing Threshold Amplitude: 0.75 V
Lead Channel Pacing Threshold Amplitude: 1 V
Lead Channel Pacing Threshold Pulse Width: 0.4 ms
Lead Channel Pacing Threshold Pulse Width: 0.4 ms
Lead Channel Sensing Intrinsic Amplitude: 2.5 mV
Lead Channel Sensing Intrinsic Amplitude: 2.5 mV
Lead Channel Sensing Intrinsic Amplitude: 26.5 mV
Lead Channel Sensing Intrinsic Amplitude: 6 mV
Lead Channel Setting Pacing Amplitude: 1.5 V
Lead Channel Setting Pacing Amplitude: 2 V
Lead Channel Setting Pacing Pulse Width: 0.4 ms
Lead Channel Setting Sensing Sensitivity: 1.2 mV
Zone Setting Status: 755011
Zone Setting Status: 755011

## 2023-03-24 ENCOUNTER — Encounter: Payer: Medicare HMO | Admitting: Student

## 2023-03-25 ENCOUNTER — Ambulatory Visit (HOSPITAL_COMMUNITY): Payer: Medicare HMO | Attending: Cardiology

## 2023-03-25 DIAGNOSIS — I5022 Chronic systolic (congestive) heart failure: Secondary | ICD-10-CM | POA: Diagnosis not present

## 2023-03-25 LAB — ECHOCARDIOGRAM COMPLETE
Area-P 1/2: 6.12 cm2
S' Lateral: 4.1 cm

## 2023-03-25 NOTE — Progress Notes (Deleted)
  Electrophysiology Office Note:   ID:  April Lowery, April Lowery 10/13/1945, MRN [id number]  Primary Cardiologist: None Electrophysiologist: Danelle Birmingham, MD  {Click to update primary MD,subspecialty MD or APP then REFRESH:1}    History of Present Illness:   April Lowery is a 77 y.o. female with h/o CHB, CHF, CAD s/p CABG, and HTN  seen today for routine electrophysiology followup.   Since last being seen in our clinic the patient reports doing ***.  she denies chest pain, palpitations, dyspnea, PND, orthopnea, nausea, vomiting, dizziness, syncope, edema, weight gain, or early satiety.   Review of systems complete and found to be negative unless listed in HPI.   EP Information / Studies Reviewed:    EKG is not ordered today. EKG from 02/25/2023 reviewed which showed NSR at 62 bpm       PPM Interrogation-  Not checked today. See last report.   Device History: Medtronic Dual Chamber PPM implanted 02/12/2018 for CHB   Echo 09/2019 LVEF 40-45%, grade 1 DD, trivial MR.   Physical Exam:   VS:  There were no vitals taken for this visit.   Wt Readings from Last 3 Encounters:  02/28/23 177 lb (80.3 kg)  02/25/23 176 lb (79.8 kg)  02/17/23 178 lb 6.4 oz (80.9 kg)     GEN: Well nourished, well developed in no acute distress NECK: No JVD; No carotid bruits CARDIAC: {EPRHYTHM:28826}, no murmurs, rubs, gallops RESPIRATORY:  Clear to auscultation without rales, wheezing or rhonchi  ABDOMEN: Soft, non-tender, non-distended EXTREMITIES:  No edema; No deformity   ASSESSMENT AND PLAN:    CHB s/p Medtronic PPM  Normal PPM function recent check No changes today  CAD s/p CABG 2008, stent 2019 ***  Chronic systolic CHF NYHA *** Volume status *** Echo *** Continue SGLT2, lasix , Toprol , Entresto  and Spiro.  Labs ***  {Click here to Review PMH, Prob List, Meds, Allergies, SHx, FHx  :1}   Disposition:   Follow up with {EPPROVIDERS:28135} {EPFOLLOW UP:28173}  Signed, Ozell Prentice Passey, PA-C

## 2023-03-27 ENCOUNTER — Ambulatory Visit: Payer: Medicare HMO | Admitting: Student

## 2023-04-01 ENCOUNTER — Other Ambulatory Visit: Payer: Self-pay | Admitting: Family Medicine

## 2023-04-11 NOTE — Progress Notes (Signed)
Remote pacemaker transmission.   

## 2023-04-18 NOTE — Progress Notes (Unsigned)
  Electrophysiology Office Note:   ID:  Taiz, Bickle 10/09/1945, MRN [id number]  Primary Cardiologist: None Electrophysiologist: Lewayne Bunting, MD  {Click to update primary MD,subspecialty MD or APP then REFRESH:1}    History of Present Illness:   April Lowery is a 78 y.o. female with h/o CHB, CHF, CAD s/p CABG, and HTN  seen today for routine electrophysiology followup.   Since last being seen in our clinic the patient reports doing ***.  she denies chest pain, palpitations, dyspnea, PND, orthopnea, nausea, vomiting, dizziness, syncope, edema, weight gain, or early satiety.   Review of systems complete and found to be negative unless listed in HPI.   EP Information / Studies Reviewed:    EKG is not ordered today. EKG from 02/25/2023 reviewed which showed NSR at 62 bpm       PPM Interrogation-  Not checked today. See last report.   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    Physical Exam:   VS:  There were no vitals taken for this visit.   Wt Readings from Last 3 Encounters:  02/28/23 177 lb (80.3 kg)  02/25/23 176 lb (79.8 kg)  02/17/23 178 lb 6.4 oz (80.9 kg)     GEN: Well nourished, well developed in no acute distress NECK: No JVD; No carotid bruits CARDIAC: {EPRHYTHM:28826}, no murmurs, rubs, gallops RESPIRATORY:  Clear to auscultation without rales, wheezing or rhonchi  ABDOMEN: Soft, non-tender, non-distended EXTREMITIES:  No edema; No deformity   ASSESSMENT AND PLAN:    CHB s/p Medtronic PPM  Normal PPM function recent check No changes today  CAD s/p CABG 2008, stent 2019 ***  Chronic systolic CHF NYHA *** Volume status *** Echo *** Continue SGLT2, lasix, Toprol, Entresto and Spiro.  Labs ***  {Click here to Review PMH, Prob List, Meds, Allergies, SHx, FHx  :1}   Disposition:   Follow up with {EPPROVIDERS:28135} {EPFOLLOW UP:28173}  Signed, Graciella Freer, PA-C

## 2023-04-21 ENCOUNTER — Encounter: Payer: Self-pay | Admitting: Student

## 2023-04-21 ENCOUNTER — Ambulatory Visit: Payer: Medicare HMO | Attending: Student | Admitting: Student

## 2023-04-21 VITALS — BP 142/62 | HR 62 | Ht 65.0 in | Wt 174.4 lb

## 2023-04-21 DIAGNOSIS — I2510 Atherosclerotic heart disease of native coronary artery without angina pectoris: Secondary | ICD-10-CM

## 2023-04-21 DIAGNOSIS — I10 Essential (primary) hypertension: Secondary | ICD-10-CM

## 2023-04-21 DIAGNOSIS — I5022 Chronic systolic (congestive) heart failure: Secondary | ICD-10-CM

## 2023-04-21 DIAGNOSIS — I255 Ischemic cardiomyopathy: Secondary | ICD-10-CM

## 2023-04-21 DIAGNOSIS — I442 Atrioventricular block, complete: Secondary | ICD-10-CM

## 2023-04-21 NOTE — Patient Instructions (Signed)
Medication Instructions:  Your physician recommends that you continue on your current medications as directed. Please refer to the Current Medication list given to you today.  *If you need a refill on your cardiac medications before your next appointment, please call your pharmacy*  Lab Work: None ordered If you have labs (blood work) drawn today and your tests are completely normal, you will receive your results only by: MyChart Message (if you have MyChart) OR A paper copy in the mail If you have any lab test that is abnormal or we need to change your treatment, we will call you to review the results.  Follow-Up: At [facility], you and your health needs are our priority.  As part of our continuing mission to provide you with exceptional heart care, we have created designated Provider Care Teams.  These Care Teams include your primary Cardiologist (physician) and Advanced Practice Providers (APPs -  Physician Assistants and Nurse Practitioners) who all work together to provide you with the care you need, when you need it.  Your next appointment:   6 month(s)  Provider:   Casimiro Needle "Otilio Saber, PA-C

## 2023-05-12 ENCOUNTER — Other Ambulatory Visit: Payer: Self-pay | Admitting: Family Medicine

## 2023-05-12 DIAGNOSIS — I5020 Unspecified systolic (congestive) heart failure: Secondary | ICD-10-CM

## 2023-05-12 DIAGNOSIS — E1169 Type 2 diabetes mellitus with other specified complication: Secondary | ICD-10-CM

## 2023-05-28 ENCOUNTER — Other Ambulatory Visit: Payer: Self-pay | Admitting: Family Medicine

## 2023-05-28 DIAGNOSIS — E1169 Type 2 diabetes mellitus with other specified complication: Secondary | ICD-10-CM

## 2023-05-28 DIAGNOSIS — I5020 Unspecified systolic (congestive) heart failure: Secondary | ICD-10-CM

## 2023-05-28 MED ORDER — EMPAGLIFLOZIN 10 MG PO TABS
10.0000 mg | ORAL_TABLET | Freq: Every day | ORAL | 2 refills | Status: DC
Start: 2023-05-28 — End: 2023-06-19

## 2023-05-28 NOTE — Telephone Encounter (Signed)
 Copied from CRM [id number]. Topic: Clinical - Medication Refill >> May 28, 2023  9:27 AM Elizebeth Brooking wrote: Most Recent Primary Care Visit:  Provider: [place], BETTY G  Department: [facility]  Visit Type: HOSPITAL FU  Date: 02/28/2023  Medication: empagliflozin (JARDIANCE) 10 MG TABS tablet  Has the patient contacted their pharmacy? Yes (Agent: If no, request that the patient contact the pharmacy for the refill. If patient does not wish to contact the pharmacy document the reason why and proceed with request.) (Agent: If yes, when and what did the pharmacy advise?)  Is this the correct pharmacy for this prescription? Yes If no, delete pharmacy and type the correct one.  This is the patient's preferred pharmacy:  [facility] -  [address] [place], [place] [address] Phone: [phone] Fax: [phone]  Has the prescription been filled recently? No  Is the patient out of the medication? Yes  Has the patient been seen for an appointment in the last year OR does the patient have an upcoming appointment? Yes  Can we respond through MyChart? Yes  Agent: Please be advised that Rx refills may take up to 3 business days. We ask that you follow-up with your pharmacy.

## 2023-06-04 ENCOUNTER — Ambulatory Visit (INDEPENDENT_AMBULATORY_CARE_PROVIDER_SITE_OTHER): Payer: Medicare HMO

## 2023-06-04 DIAGNOSIS — I255 Ischemic cardiomyopathy: Secondary | ICD-10-CM

## 2023-06-04 LAB — CUP PACEART REMOTE DEVICE CHECK
Battery Remaining Longevity: 106 mo
Battery Voltage: 2.99 V
Brady Statistic AP VP Percent: 40.01 %
Brady Statistic AP VS Percent: 0.08 %
Brady Statistic AS VP Percent: 58.89 %
Brady Statistic AS VS Percent: 1.02 %
Brady Statistic RA Percent Paced: 40.12 %
Brady Statistic RV Percent Paced: 98.9 %
Date Time Interrogation Session: 20250312060439
Implantable Lead Connection Status: 753985
Implantable Lead Connection Status: 753985
Implantable Lead Implant Date: 20191121
Implantable Lead Implant Date: 20191121
Implantable Lead Location: 753859
Implantable Lead Location: 753860
Implantable Lead Model: 4076
Implantable Lead Model: 4076
Implantable Pulse Generator Implant Date: 20191121
Lead Channel Impedance Value: 323 Ohm
Lead Channel Impedance Value: 361 Ohm
Lead Channel Impedance Value: 418 Ohm
Lead Channel Impedance Value: 418 Ohm
Lead Channel Pacing Threshold Amplitude: 0.75 V
Lead Channel Pacing Threshold Amplitude: 0.875 V
Lead Channel Pacing Threshold Pulse Width: 0.4 ms
Lead Channel Pacing Threshold Pulse Width: 0.4 ms
Lead Channel Sensing Intrinsic Amplitude: 2 mV
Lead Channel Sensing Intrinsic Amplitude: 2 mV
Lead Channel Sensing Intrinsic Amplitude: 7.875 mV
Lead Channel Sensing Intrinsic Amplitude: 7.875 mV
Lead Channel Setting Pacing Amplitude: 1.5 V
Lead Channel Setting Pacing Amplitude: 2 V
Lead Channel Setting Pacing Pulse Width: 0.4 ms
Lead Channel Setting Sensing Sensitivity: 1.2 mV
Zone Setting Status: 755011
Zone Setting Status: 755011

## 2023-06-05 ENCOUNTER — Encounter: Payer: Self-pay | Admitting: Internal Medicine

## 2023-06-17 ENCOUNTER — Ambulatory Visit (INDEPENDENT_AMBULATORY_CARE_PROVIDER_SITE_OTHER): Admitting: Family Medicine

## 2023-06-17 ENCOUNTER — Encounter: Payer: Self-pay | Admitting: Family Medicine

## 2023-06-17 VITALS — BP 136/58 | HR 70 | Temp 97.8°F | Resp 16 | Ht 65.0 in | Wt 179.0 lb

## 2023-06-17 DIAGNOSIS — T466X5A Adverse effect of antihyperlipidemic and antiarteriosclerotic drugs, initial encounter: Secondary | ICD-10-CM | POA: Diagnosis not present

## 2023-06-17 DIAGNOSIS — N1831 Chronic kidney disease, stage 3a: Secondary | ICD-10-CM | POA: Diagnosis not present

## 2023-06-17 DIAGNOSIS — E1169 Type 2 diabetes mellitus with other specified complication: Secondary | ICD-10-CM

## 2023-06-17 DIAGNOSIS — Z7984 Long term (current) use of oral hypoglycemic drugs: Secondary | ICD-10-CM | POA: Diagnosis not present

## 2023-06-17 DIAGNOSIS — M791 Myalgia, unspecified site: Secondary | ICD-10-CM | POA: Diagnosis not present

## 2023-06-17 DIAGNOSIS — R251 Tremor, unspecified: Secondary | ICD-10-CM | POA: Diagnosis not present

## 2023-06-17 DIAGNOSIS — E538 Deficiency of other specified B group vitamins: Secondary | ICD-10-CM | POA: Diagnosis not present

## 2023-06-17 LAB — MICROALBUMIN / CREATININE URINE RATIO
Creatinine,U: 102.5 mg/dL
Microalb Creat Ratio: 21.7 mg/g (ref 0.0–30.0)
Microalb, Ur: 2.2 mg/dL — ABNORMAL HIGH (ref 0.0–1.9)

## 2023-06-17 LAB — CBC
HCT: 44.2 % (ref 36.0–46.0)
Hemoglobin: 14.3 g/dL (ref 12.0–15.0)
MCHC: 32.5 g/dL (ref 30.0–36.0)
MCV: 85.9 fl (ref 78.0–100.0)
Platelets: 213 10*3/uL (ref 150.0–400.0)
RBC: 5.14 Mil/uL — ABNORMAL HIGH (ref 3.87–5.11)
RDW: 15.2 % (ref 11.5–15.5)
WBC: 7.1 10*3/uL (ref 4.0–10.5)

## 2023-06-17 LAB — COMPREHENSIVE METABOLIC PANEL
ALT: 15 U/L (ref 0–35)
AST: 20 U/L (ref 0–37)
Albumin: 4.3 g/dL (ref 3.5–5.2)
Alkaline Phosphatase: 89 U/L (ref 39–117)
BUN: 20 mg/dL (ref 6–23)
CO2: 28 meq/L (ref 19–32)
Calcium: 9.6 mg/dL (ref 8.4–10.5)
Chloride: 102 meq/L (ref 96–112)
Creatinine, Ser: 0.98 mg/dL (ref 0.40–1.20)
GFR: 56.32 mL/min — ABNORMAL LOW (ref >60.00)
Glucose, Bld: 119 mg/dL — ABNORMAL HIGH (ref 70–99)
Potassium: 4.2 meq/L (ref 3.5–5.1)
Sodium: 139 meq/L (ref 135–145)
Total Bilirubin: 0.5 mg/dL (ref 0.2–1.2)
Total Protein: 7.8 g/dL (ref 6.0–8.3)

## 2023-06-17 LAB — VITAMIN B12: Vitamin B-12: 652 pg/mL (ref 211–911)

## 2023-06-17 LAB — HEMOGLOBIN A1C: Hgb A1c MFr Bld: 7.5 % — ABNORMAL HIGH (ref 4.6–6.5)

## 2023-06-17 LAB — TSH: TSH: 3.78 u[IU]/mL (ref 0.35–5.50)

## 2023-06-17 NOTE — Assessment & Plan Note (Signed)
 Currently on B12 1000 mcg daily. Further recommendation will be given according to B12 result.

## 2023-06-17 NOTE — Assessment & Plan Note (Signed)
 Stable, 02/2023 Cr 1.08 and eGFR 54. Currently on Jardiance 10 mg for DM 2 and HFrEF, also on Entresto for HFrEF. Continue adequate hydration, low-salt diet, and avoidance of NSAIDs as well as BP and glucose control.

## 2023-06-17 NOTE — Assessment & Plan Note (Signed)
 Last LDL 141 in 01/2023. Currently on Zetia 10 mg daily, which has been better tolerated.

## 2023-06-17 NOTE — Patient Instructions (Addendum)
 A few things to remember from today's visit:  Type 2 diabetes mellitus with other specified complication, without long-term current use of insulin (HCC) - Plan: Microalbumin / creatinine urine ratio, Hemoglobin A1c  Tremor - Plan: Comprehensive metabolic panel, TSH, CBC, Vitamin B12  If you need refills for medications you take chronically, please call your pharmacy. Do not use My Chart to request refills or for acute issues that need immediate attention. If you send a my chart message, it may take a few days to be addressed, specially if I am not in the office.  Please be sure medication list is accurate. If a new problem present, please set up appointment sooner than planned today.

## 2023-06-17 NOTE — Assessment & Plan Note (Signed)
 Hemoglobin A1c has been adequate, last one 7.1 (01/2023). Continue Jardiance 10 mg daily. Further recommendation will be given according to hemoglobin A1c result. Annual eye exam and foot care to continue. F/U in 5-6 months.

## 2023-06-17 NOTE — Progress Notes (Unsigned)
 ACUTE VISIT Chief Complaint  Patient presents with   shaking hands    Pt states shaking is not all the time, only happens occasionally.   HPI: April Lowery is a 78 y.o. female with a PMHx significant for CAD, CVA, HTN, DM II, HFrEF, CKD III, HLD, and B12 deficiency, who is here today with her son with above concern.  She states that her hands shake when she is holding her phone, utensils while eating, or cup when drinking. This problem has been going on for a few months The right hand is worse than the left.  She is right handed.   She doesn't have problems when still. The problem is not severe, she is not spilling or dropping things.  Also endorses occasional low occipital headaches and neck pain when laying back in bed or resting back in a chair and continuing chronic unstable gait.  She has not noticed any patterns with the shaking related to stress or caffeine intake. She drinks an occasional glass of wine but no significant drinking history.  No FMHx of tremor.  Pertinent negatives include head tremors, falls, numbness, tinglings,MS chnages, or urinary or bowel incontinence during episodes.   Lab Results  Component Value Date   NA 139 02/25/2023   CL 103 02/25/2023   K 4.2 02/25/2023   CO2 27 02/25/2023   BUN 14 02/25/2023   CREATININE 1.08 (H) 02/25/2023   GFRNONAA 54 (L) 02/25/2023   CALCIUM 9.7 02/25/2023   ALBUMIN 4.3 06/18/2021   GLUCOSE 145 (H) 02/25/2023   B12 def on B12 supplementation. Lab Results  Component Value Date   VITAMINB12 776 06/18/2021   Diabetes Mellitus II: Dx'ed in 01/2018 with a hemoglobin A1c of 7.8.  - She has not been checking her blood sugar.  - Medications: Currently on Jardiance 10 mg daily.  - Negative for symptoms of hypoglycemia, polyuria, polydipsia, foot ulcers/trauma  Lab Results  Component Value Date   HGBA1C 7.1 (H) 01/24/2023   Lab Results  Component Value Date   MICROALBUR <0.7 05/01/2022    Hyperlipidemia: Currently on Zetia 10 mg daily.  Side effects from medication: She has had statin myopathies in the past.  Lab Results  Component Value Date   CHOL 226 (H) 01/24/2023   HDL 49.70 01/24/2023   LDLCALC 141 (H) 01/24/2023   LDLDIRECT 140.0 06/18/2021   TRIG 178.0 (H) 01/24/2023   CHOLHDL 5 01/24/2023   Hypertension and HFrEF:  Currently on Entresto 24-26 mg twice daily and metoprolol succinate 50 mg daily.  Side effects: none She mentions she has occasional SOB, which has been stable for years, no associated cough or wheezing.  Negative for unusual or severe headache, visual changes, exertional chest pain, palpitations, focal weakness, or edema.  Lab Results  Component Value Date   CREATININE 1.08 (H) 02/25/2023   BUN 14 02/25/2023   NA 139 02/25/2023   K 4.2 02/25/2023   CL 103 02/25/2023   CO2 27 02/25/2023   Chest CT from 02/13/2023 Impression:  1.) No evidence of pulmonary embolus.  2.) Cardiomegaly.  3.) No acute intrathoracic process.  4.) Aortic Atherosclerosis (IDC10-I70.0). Coronary artery atherosclerosis.   Review of Systems  Constitutional:  Negative for activity change, appetite change, chills and fever.  HENT:  Negative for sore throat and trouble swallowing.   Gastrointestinal:  Negative for abdominal pain, nausea and vomiting.  Endocrine: Negative for cold intolerance and heat intolerance.  Genitourinary:  Negative for decreased urine volume, dysuria and hematuria.  Skin:  Negative for rash.  Neurological:  Negative for syncope and facial asymmetry.  Psychiatric/Behavioral:  Negative for confusion and hallucinations.   See other pertinent positives and negatives in HPI.  Current Outpatient Medications on File Prior to Visit  Medication Sig Dispense Refill   acetaminophen (TYLENOL) 650 MG CR tablet Take 650 mg by mouth every 8 (eight) hours as needed for pain.     aspirin EC 81 MG tablet Take 81 mg by mouth daily. Swallow whole.      cholecalciferol (VITAMIN D3) 25 MCG (1000 UT) tablet Take 1,000 Units by mouth daily.     clopidogrel (PLAVIX) 75 MG tablet TAKE 1 TABLET BY MOUTH EVERY DAY 90 tablet 3   Cyanocobalamin (B-12) 2500 MCG TABS Take 1 tablet by mouth daily.     empagliflozin (JARDIANCE) 10 MG TABS tablet Take 1 tablet (10 mg total) by mouth daily. 90 tablet 2   ezetimibe (ZETIA) 10 MG tablet TAKE 1 TABLET BY MOUTH EVERY DAY 90 tablet 3   furosemide (LASIX) 20 MG tablet TAKE 2 TABLETS BY MOUTH DAILY FOR 5 DAYS THEN 1 TABLET DAILY 90 tablet 1   metoprolol succinate (TOPROL-XL) 50 MG 24 hr tablet Take 1 tablet (50 mg total) by mouth daily. Take with or immediately following a meal. 90 tablet 1   pantoprazole (PROTONIX) 40 MG tablet TAKE 1 TABLET BY MOUTH EVERY DAY 90 tablet 3   sacubitril-valsartan (ENTRESTO) 24-26 MG Take 1 tablet by mouth 2 (two) times daily. 180 tablet 1   sertraline (ZOLOFT) 50 MG tablet Take 1 tablet (50 mg total) by mouth daily. 90 tablet 2   spironolactone (ALDACTONE) 25 MG tablet Take 1 tablet (25 mg total) by mouth daily. 90 tablet 3   No current facility-administered medications on file prior to visit.   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/15/2019   HLD (hyperlipidemia) 02/15/2019   HTN (hypertension) 02/15/2019   Ischemic cardiomyopathy    Melena    Pacemaker    Pulmonary thromboembolism (HCC) 02/15/2019   Allergies  Allergen Reactions   Penicillins Rash   Social History   Socioeconomic History   Marital status: Widowed    Spouse name: Not on file   Number of children: 4   Years of education: Not on file   Highest education level: 12th grade  Occupational History   Occupation: retired   Tobacco Use   Smoking status: Former   Smokeless tobacco: Never  Advertising account planner   Vaping status: Never Used  Substance and Sexual Activity   Alcohol use: Never   Drug use: Never   Sexual activity: Yes    Comment: MARRIED  Other Topics Concern   Not on file  Social History Narrative   Not on file   Social Drivers of Health   Financial Resource Strain: Low Risk  (06/13/2023)   Overall Financial Resource Strain (CARDIA)    Difficulty of Paying Living Expenses: Not hard at all  Food Insecurity: No Food Insecurity (06/13/2023)   Hunger Vital Sign    Worried About Running Out of Food in the Last Year: Never true    Ran Out of Food in the Last Year:  Never true  Transportation Needs: No Transportation Needs (06/13/2023)   PRAPARE - Administrator, Civil Service (Medical): No    Lack of Transportation (Non-Medical): No  Physical Activity: Insufficiently Active (06/13/2023)   Exercise Vital Sign    Days of Exercise per Week: 5 days    Minutes of Exercise per Session: 10 min  Stress: No Stress Concern Present (06/13/2023)   [employer] of Occupational Health - Occupational Stress Questionnaire    Feeling of Stress : Not at all  Social Connections: Socially Isolated (06/13/2023)   Social Connection and Isolation Panel [NHANES]    Frequency of Communication with Friends and Family: Once a week    Frequency of Social Gatherings with Friends and Family: More than three times a week    Attends Religious Services: Never    Database administrator or Organizations: No    Attends Banker Meetings: Not on file    Marital Status: Widowed   Vitals:   06/17/23 0902  BP: (!) 136/58  Pulse: 70  Resp: 16  Temp: 97.8 F (36.6 C)  SpO2: 95%   Body mass index is 29.79 kg/m.  Physical Exam Vitals and nursing note reviewed.  Constitutional:      General: She is not in acute distress.    Appearance: She is well-developed.  HENT:     Head: Normocephalic and  atraumatic.     Mouth/Throat:     Mouth: Mucous membranes are moist.     Pharynx: Oropharynx is clear.  Eyes:     Conjunctiva/sclera: Conjunctivae normal.  Cardiovascular:     Rate and Rhythm: Normal rate and regular rhythm.     Pulses:          Posterior tibial pulses are 2+ on the right side and 2+ on the left side.     Heart sounds: No murmur heard. Pulmonary:     Effort: Pulmonary effort is normal. No respiratory distress.     Breath sounds: Normal breath sounds.  Abdominal:     Palpations: Abdomen is soft. There is no hepatomegaly or mass.     Tenderness: There is no abdominal tenderness.  Musculoskeletal:     Right lower leg: No edema.     Left lower leg: No edema.  Lymphadenopathy:     Cervical: No cervical adenopathy.  Skin:    General: Skin is warm.     Findings: No erythema or rash.  Neurological:     General: No focal deficit present.     Mental Status: She is alert and oriented to person, place, and time.     Motor: No tremor or pronator drift.     Comments: Unstable gait, it is not assisted.  Psychiatric:        Mood and Affect: Mood and affect normal.   Hemoglobin A1c has been adequate ASSESSMENT AND PLAN:  Ms. Iott was seen today for tremors in her hands.  Lab Results  Component Value Date   MICROALBUR 2.2 (H) 06/17/2023   MICROALBUR <0.7 05/01/2022   Lab Results  Component Value Date   HGBA1C 7.5 (H) 06/17/2023   Lab Results  Component Value Date   TSH 3.78 06/17/2023   Lab Results  Component Value Date   WBC 7.1 06/17/2023   HGB 14.3 06/17/2023   HCT 44.2 06/17/2023   MCV 85.9 06/17/2023   PLT 213.0 06/17/2023   Lab Results  Component Value Date   VITAMINB12 652 06/17/2023   Lab Results  Component Value Date   NA 139 06/17/2023   CL 102 06/17/2023   K 4.2 06/17/2023   CO2 28 06/17/2023   BUN 20 06/17/2023   CREATININE 0.98 06/17/2023   GFR 56.32 (L) 06/17/2023   CALCIUM 9.6 06/17/2023   ALBUMIN 4.3 06/17/2023   GLUCOSE 119 (H)  06/17/2023   Lab Results  Component Value Date   ALT 15 06/17/2023   AST 20 06/17/2023   ALKPHOS 89 06/17/2023   BILITOT 0.5 06/17/2023   Type 2 diabetes mellitus with other specified complication, without long-term current use of insulin (HCC) Assessment & Plan: Hemoglobin A1c has been adequate, last one 7.1 (01/2023). Continue Jardiance 10 mg daily. Further recommendation will be given according to hemoglobin A1c result. Annual eye exam and foot care to continue. F/U in 5-6 months.  Orders: -     Microalbumin / creatinine urine ratio; Future -     Hemoglobin A1c; Future  Tremor Not present today. We discussed possible etiologies, history and examination do not suggest a serious process. ?  Essential tremor. We reviewed diagnosis, prognosis, treatment options. Currently she is on metoprolol for CAD. Recommend monitoring for changes, I do not think imaging needed at this time.  -     Comprehensive metabolic panel; Future -     TSH; Future -     CBC; Future -     Vitamin B12; Future  Stage 3a chronic kidney disease (HCC) Assessment & Plan: Stable, 02/2023 Cr 1.08 and eGFR 54. Currently on Jardiance 10 mg for DM 2 and HFrEF, also on Entresto for HFrEF. Continue adequate hydration, low-salt diet, and avoidance of NSAIDs as well as BP and glucose control.  Orders: -     Comprehensive metabolic panel; Future -     Microalbumin / creatinine urine ratio; Future  Myalgia due to statin Assessment & Plan: Last LDL 141 in 01/2023. Currently on Zetia 10 mg daily, which has been better tolerated.  B12 deficiency Assessment & Plan: Currently on B12 1000 mcg daily. Further recommendation will be given according to B12 result.  Orders: -     Vitamin B12; Future  Return in about 4 months (around 10/17/2023).  I, Rolla Etienne Wierda, acting as a scribe for Joyceann Kruser [place], MD., have documented all relevant documentation on the behalf of Kyandra Mcclaine [place], MD, as directed by  Mahamadou Weltz [place],  MD while in the presence of Maxyne Derocher [place], MD.   I, Inella Kuwahara [place], MD, have reviewed all documentation for this visit. The documentation on 06/17/23 for the exam, diagnosis, procedures, and orders are all accurate and complete.  Tomisha Reppucci G. [place], MD  [facility]. [facility] office.

## 2023-06-19 MED ORDER — EMPAGLIFLOZIN 25 MG PO TABS: 25.0000 mg | ORAL_TABLET | Freq: Every day | ORAL | 1 refills | Status: DC

## 2023-06-23 ENCOUNTER — Other Ambulatory Visit: Payer: Self-pay | Admitting: Family Medicine

## 2023-06-23 DIAGNOSIS — F419 Anxiety disorder, unspecified: Secondary | ICD-10-CM

## 2023-06-24 ENCOUNTER — Other Ambulatory Visit: Payer: Self-pay | Admitting: Internal Medicine

## 2023-06-24 ENCOUNTER — Other Ambulatory Visit: Payer: Self-pay | Admitting: Family Medicine

## 2023-07-21 NOTE — Progress Notes (Signed)
 Remote pacemaker transmission.

## 2023-08-12 ENCOUNTER — Ambulatory Visit (INDEPENDENT_AMBULATORY_CARE_PROVIDER_SITE_OTHER): Admitting: Family Medicine

## 2023-08-12 ENCOUNTER — Encounter: Payer: Self-pay | Admitting: Family Medicine

## 2023-08-12 VITALS — BP 118/70 | HR 80 | Temp 98.2°F | Resp 16 | Ht 65.0 in | Wt 178.4 lb

## 2023-08-12 DIAGNOSIS — Z0000 Encounter for general adult medical examination without abnormal findings: Secondary | ICD-10-CM | POA: Insufficient documentation

## 2023-08-12 DIAGNOSIS — E1169 Type 2 diabetes mellitus with other specified complication: Secondary | ICD-10-CM

## 2023-08-12 DIAGNOSIS — N1831 Chronic kidney disease, stage 3a: Secondary | ICD-10-CM

## 2023-08-12 DIAGNOSIS — E782 Mixed hyperlipidemia: Secondary | ICD-10-CM

## 2023-08-12 NOTE — Progress Notes (Signed)
 HPI: April Lowery is a 78 y.o. female with a PMHx significant for CAD, CVA, HTN, DM II, HFrEF, CKD III, HLD, and B12 deficiency, who is here today for April routine physical.  Last CPE: 06/18/2021  Pt will move to [place]  at the end of June; planning on living with April Lowery for the next 2 yrs. She overall has a better lifestyle when visiting [place] in the past -- walks more frequently, breathing improves, and eating habits are healthier.  Currently she is living with April Lowery and Lowery.  Exercise: Walking for 2 hours in [place] with a cart. Diet: Eats home-cooked meals.   Smoking: Quit in early 2000's. Previously smoked 1.5 packs per day.  Alcohol consumption: Half a glass of wine at night; "not every day".   Dental: She does not have a dentist, she has dentures.  Vision: Not UTD, but plans to have April routine vision exam before moving to [place].   Immunization History  Administered Date(s) Administered   PFIZER(Purple Top)SARS-COV-2 Vaccination 07/05/2019, 07/27/2019   Pneumococcal Conjugate-13 12/23/2012   Health Maintenance  Topic Date Due   Medicare Annual Wellness (AWV)  11/15/2022   OPHTHALMOLOGY EXAM  06/12/2023   COVID-19 Vaccine (3 - 2024-25 season) 08/28/2023 (Originally 11/24/2022)   Pneumonia Vaccine 70+ Years old (2 of 2 - PPSV23) 01/24/2024 (Originally 02/17/2013)   DTaP/Tdap/Td (1 - Tdap) 04/02/2024 (Originally 07/31/1966)   Zoster Vaccines- Shingrix (1 of 2) 04/02/2024 (Originally 07/30/1997)   INFLUENZA VACCINE  10/24/2023   HEMOGLOBIN A1C  12/18/2023   FOOT EXAM  01/24/2024   Diabetic kidney evaluation - eGFR measurement  06/16/2024   Diabetic kidney evaluation - Urine ACR  06/16/2024   Hepatitis C Screening  Completed   HPV VACCINES  Aged Out   Meningococcal B Vaccine  Aged Out   MAMMOGRAM  Discontinued   DEXA SCAN  Discontinued   Colonoscopy  Discontinued   Chronic medical problems:  Hypertension,CAD, and HFrEF:  She is on Entresto  24-26 mg BID,  Metoprolol  succinate 50 mg daily, Spironolactone  25 mg daily, Aspirin  81 mg daily, Plavix  75 mg daily and Lasix  20 mg daily.   Will follow up with April cardiologist before moving to [place] .  She also has a cardiology care team in [place] .  Negative for unusual or severe headache, visual changes, exertional chest pain, worsening dyspnea, new focal weakness, or edema.  CKD III: Negative for gross hematuria or foam in urine. Lab Results  Component Value Date   NA 139 06/17/2023   CL 102 06/17/2023   K 4.2 06/17/2023   CO2 28 06/17/2023   BUN 20 06/17/2023   CREATININE 0.98 06/17/2023   GFR 56.32 (L) 06/17/2023   CALCIUM  9.6 06/17/2023   ALBUMIN 4.3 06/17/2023   GLUCOSE 119 (H) 06/17/2023   CVA with residual unstable gait and RLE and RUE decreased sensation.  Diabetes Mellitus II:  Dx'ed in 01/2018 with a hemoglobin A1c of 7.8.  She is on Jardiance  25 mg once daily  Good compliance and tolerance.  Exercise includes weight training with 5 lb weights and walking regularly.  Negative for symptoms of hypoglycemia, polyuria, polydipsia, foot ulcers/trauma  Lab Results  Component Value Date   HGBA1C 7.5 (H) 06/17/2023   Lab Results  Component Value Date   MICROALBUR 2.2 (H) 06/17/2023   Hyperlipidemia: Currently on Zetia  10 mg daily  Following a low fat diet with exercise.  Side effects from medication: none.  Statins caused myalgias.  Lab Results  Component Value  Date   CHOL 226 (H) 01/24/2023   HDL 49.70 01/24/2023   LDLCALC 141 (H) 01/24/2023   LDLDIRECT 140.0 06/18/2021   TRIG 178.0 (H) 01/24/2023   CHOLHDL 5 01/24/2023   Anxiety: Currently on Sertraline  50 mg daily, which feels is still helping.  Review of Systems  Constitutional:  Negative for activity change, appetite change and fever.  HENT:  Negative for mouth sores, sore throat and trouble swallowing.   Eyes:  Negative for redness and visual disturbance.  Respiratory:  Negative for cough and shortness of breath.    Cardiovascular:  Negative for chest pain and leg swelling.  Gastrointestinal:  Negative for abdominal pain, nausea and vomiting.  Endocrine: Negative for cold intolerance, heat intolerance, polydipsia, polyphagia and polyuria.  Genitourinary:  Negative for decreased urine volume, dysuria and hematuria.  Musculoskeletal:  Negative for gait problem and myalgias.  Skin:  Negative for color change and rash.  Allergic/Immunologic: Negative for environmental allergies.  Neurological:  Negative for syncope, weakness and headaches.  Psychiatric/Behavioral:  Negative for behavioral problems and confusion.   All other systems reviewed and are negative.  Current Outpatient Medications on File Prior to Visit  Medication Sig Dispense Refill   acetaminophen  (TYLENOL ) 650 MG CR tablet Take 650 mg by mouth every 8 (eight) hours as needed for pain.     aspirin  EC 81 MG tablet Take 81 mg by mouth daily. Swallow whole.     cholecalciferol  (VITAMIN D3) 25 MCG (1000 UT) tablet Take 1,000 Units by mouth daily.     clopidogrel  (PLAVIX ) 75 MG tablet TAKE 1 TABLET BY MOUTH EVERY DAY 90 tablet 2   Cyanocobalamin  (B-12) 2500 MCG TABS Take 1 tablet by mouth daily.     empagliflozin  (JARDIANCE ) 25 MG TABS tablet Take 1 tablet (25 mg total) by mouth daily. 90 tablet 1   ezetimibe  (ZETIA ) 10 MG tablet TAKE 1 TABLET BY MOUTH EVERY DAY 90 tablet 3   furosemide  (LASIX ) 20 MG tablet TAKE 2 TABLETS BY MOUTH DAILY FOR 5 DAYS THEN 1 TABLET DAILY 90 tablet 1   metoprolol  succinate (TOPROL -XL) 50 MG 24 hr tablet TAKE 1 TABLET BY MOUTH DAILY. TAKE WITH OR IMMEDIATELY FOLLOWING A MEAL. 90 tablet 1   pantoprazole  (PROTONIX ) 40 MG tablet TAKE 1 TABLET BY MOUTH EVERY DAY 90 tablet 3   sacubitril-valsartan (ENTRESTO ) 24-26 MG Take 1 tablet by mouth 2 (two) times daily. 180 tablet 1   sertraline  (ZOLOFT ) 50 MG tablet TAKE 1 TABLET BY MOUTH EVERY DAY 90 tablet 2   spironolactone  (ALDACTONE ) 25 MG tablet Take 1 tablet (25 mg total) by  mouth daily. 90 tablet 3   No current facility-administered medications on file prior to visit.   Past Medical History:  Diagnosis Date   Acute ST segment elevation myocardial infarction [facility])    Arthritis    CAD (coronary artery disease) 02/15/2019   Cancer of left kidney (HCC)    Complete atrioventricular block (HCC)    DVT (deep venous thrombosis) (HCC) 02/15/2019   GI bleeding    History of cerebellar stroke 02/15/2019   HLD (hyperlipidemia) 02/15/2019   HTN (hypertension) 02/15/2019   Ischemic cardiomyopathy    Melena    Pacemaker    Pulmonary thromboembolism (HCC) 02/15/2019    Past Surgical History:  Procedure Laterality Date   CHOLECYSTECTOMY     CORONARY ARTERY BYPASS GRAFT     STENTS   PACEMAKER INSERTION     TONSILLECTOMY AND ADENOIDECTOMY  2017    Allergies  Allergen Reactions   Penicillins Rash    Family History  Problem Relation Age of Onset   Heart disease Mother    Heart attack Mother    Heart disease Father    Heart attack Father    Heart disease Brother    Heart attack Brother    Heart attack Maternal Grandmother    Heart attack Maternal Grandfather    Heart attack Paternal Grandmother    Heart attack Paternal Grandfather     Social History   Socioeconomic History   Marital status: Widowed    Spouse name: Not on file   Number of children: 4   Years of education: Not on file   Highest education level: 12th grade  Occupational History   Occupation: retired  Tobacco Use   Smoking status: Former   Smokeless tobacco: Never  Advertising account planner   Vaping status: Never Used  Substance and Sexual Activity   Alcohol use: Never   Drug use: Never   Sexual activity: Yes    Comment: MARRIED  Other Topics Concern   Not on file  Social History Narrative   Not on file   Social Drivers of Health   Financial Resource Strain: Low Risk  (06/13/2023)   Overall Financial Resource Strain (CARDIA)    Difficulty of Paying Living Expenses: Not hard at all   Food Insecurity: No Food Insecurity (06/13/2023)   Hunger Vital Sign    Worried About Running Out of Food in the Last Year: Never true    Ran Out of Food in the Last Year: Never true  Transportation Needs: No Transportation Needs (06/13/2023)   PRAPARE - Administrator, Civil Service (Medical): No    Lack of Transportation (Non-Medical): No  Physical Activity: Insufficiently Active (06/13/2023)   Exercise Vital Sign    Days of Exercise per Week: 5 days    Minutes of Exercise per Session: 10 min  Stress: No Stress Concern Present (06/13/2023)   [employer] of Occupational Health - Occupational Stress Questionnaire    Feeling of Stress : Not at all  Social Connections: Socially Isolated (06/13/2023)   Social Connection and Isolation Panel [NHANES]    Frequency of Communication with Friends and Family: Once a week    Frequency of Social Gatherings with Friends and Family: More than three times a week    Attends Religious Services: Never    Database administrator or Organizations: No    Attends Banker Meetings: Not on file    Marital Status: Widowed   Vitals:   08/12/23 0946  BP: 118/70  Pulse: 80  Resp: 16  Temp: 98.2 F (36.8 C)  SpO2: 96%   Body mass index is 29.68 kg/m.  Wt Readings from Last 3 Encounters:  08/12/23 178 lb 6 oz (80.9 kg)  06/17/23 179 lb (81.2 kg)  04/21/23 174 lb 6.4 oz (79.1 kg)   Physical Exam Vitals and nursing note reviewed.  Constitutional:      General: She is not in acute distress.    Appearance: She is well-developed.  HENT:     Head: Normocephalic and atraumatic.     Right Ear: Tympanic membrane, ear canal and external ear normal.     Left Ear: Tympanic membrane, ear canal and external ear normal.     Mouth/Throat:     Mouth: Mucous membranes are moist.     Pharynx: Oropharynx is clear. Uvula midline.  Eyes:     Extraocular Movements: Extraocular movements  intact.     Conjunctiva/sclera: Conjunctivae normal.      Pupils: Pupils are equal, round, and reactive to light.  Neck:     Thyroid : No thyromegaly.     Trachea: No tracheal deviation.  Cardiovascular:     Rate and Rhythm: Normal rate and regular rhythm.     Heart sounds: No murmur heard.    Comments: DP pulses palpable. Pulmonary:     Effort: Pulmonary effort is normal. No respiratory distress.     Breath sounds: Normal breath sounds.  Abdominal:     Palpations: Abdomen is soft. There is no hepatomegaly or mass.     Tenderness: There is no abdominal tenderness.  Genitourinary:    Comments: No concerns. Musculoskeletal:     Right lower leg: No edema.     Left lower leg: No edema.     Comments: No signs of synovitis appreciated.  Lymphadenopathy:     Cervical: No cervical adenopathy.     Upper Body:     Right upper body: No supraclavicular adenopathy.     Left upper body: No supraclavicular adenopathy.  Skin:    General: Skin is warm.     Findings: No erythema or rash.  Neurological:     General: No focal deficit present.     Mental Status: She is alert and oriented to person, place, and time.     Cranial Nerves: No cranial nerve deficit.     Deep Tendon Reflexes:     Reflex Scores:      Bicep reflexes are 2+ on the right side and 2+ on the left side.      Patellar reflexes are 2+ on the right side and 2+ on the left side.    Comments: Mildly unstable gait, not assisted.  Psychiatric:        Mood and Affect: Mood and affect normal.   ASSESSMENT AND PLAN: Ms. MARLETTE CURVIN was here today annual physical examination.  Routine general medical examination at a health care facility Assessment & Plan: We discussed the importance of regular physical activity as tolerated and healthy diet for prevention of chronic illness and/or complications. Not interested in continuing breast cancer screening. Preventive guidelines reviewed. Vaccination: Declined.  Ca++ and vit D supplementation to continue. Next CPE in a year.   Stage  3a chronic kidney disease (HCC) Assessment & Plan: Problem has been stable, 05/2023 Cr 0.98  and eGFR 56. Currently on Jardiance  10 mg for DM 2 and HFrEF, also on Entresto  for HFrEF. Continue adequate hydration, low-salt diet, and avoidance of NSAIDs.   Mixed hyperlipidemia Assessment & Plan: Last LDL 141 in 01/2023. Continue Zetia  10 mg daily. She has not tolerated statins.   Type 2 diabetes mellitus with other specified complication, without long-term current use of insulin (HCC) Assessment & Plan: Last HgA1C was on 06/17/23, 7.5. Continue Jardiance  25 mg daily. Planning on eye exam before leaving to [place] . Continue appropriate foot care. F/U in July or August 2025.   Return in 3 months (on 11/12/2023) for with PCP in [place] .  I, Bernita Bristle, acting as a scribe for Keedan Sample [place], MD., have documented all relevant documentation on the behalf of Aleli Navedo [place], MD, as directed by   while in the presence of Markian Glockner [place], MD.  I, Neima Lacross [place], MD, have reviewed all documentation for this visit. The documentation on 08/14/23 for the exam, diagnosis, procedures, and orders are all accurate and complete.  Mieke Brinley G. [place], MD  [facility]. [place]  office.

## 2023-08-12 NOTE — Patient Instructions (Addendum)
 A few things to remember from today's visit:  Routine general medical examination at a health care facility  Stage 3a chronic kidney disease (HCC)  B12 deficiency  Mixed hyperlipidemia  Type 2 diabetes mellitus with other specified complication, without long-term current use of insulin (HCC)  Due for diabetes test in July or August. Good luck!  If you need refills for medications you take chronically, please call your pharmacy. Do not use My Chart to request refills or for acute issues that need immediate attention. If you send a my chart message, it may take a few days to be addressed, specially if I am not in the office.  Please be sure medication list is accurate. If a new problem present, please set up appointment sooner than planned today.

## 2023-08-14 NOTE — Assessment & Plan Note (Signed)
 Last LDL 141 in 01/2023. Continue Zetia  10 mg daily. She has not tolerated statins.

## 2023-08-14 NOTE — Assessment & Plan Note (Addendum)
 We discussed the importance of regular physical activity as tolerated and healthy diet for prevention of chronic illness and/or complications. Not interested in continuing breast cancer screening. Preventive guidelines reviewed. Vaccination: Declined.  Ca++ and vit D supplementation to continue. Next CPE in a year.

## 2023-08-14 NOTE — Assessment & Plan Note (Signed)
 Problem has been stable, 05/2023 Cr 0.98  and eGFR 56. Currently on Jardiance  10 mg for DM 2 and HFrEF, also on Entresto  for HFrEF. Continue adequate hydration, low-salt diet, and avoidance of NSAIDs.

## 2023-08-14 NOTE — Assessment & Plan Note (Signed)
 Last HgA1C was on 06/17/23, 7.5. Continue Jardiance  25 mg daily. Planning on eye exam before leaving to [place] . Continue appropriate foot care. F/U in July or August 2025.

## 2023-08-22 DIAGNOSIS — H2513 Age-related nuclear cataract, bilateral: Secondary | ICD-10-CM | POA: Diagnosis not present

## 2023-08-26 ENCOUNTER — Encounter: Payer: Self-pay | Admitting: Family Medicine

## 2023-08-26 DIAGNOSIS — H9209 Otalgia, unspecified ear: Secondary | ICD-10-CM

## 2023-08-27 NOTE — Telephone Encounter (Signed)
 I have spoken with our scheduler and she is reaching back out to patient to get her worked in before leaving to go out of state for 2 years.  She is device dependent with CHB and we need to have a plan and contact info with her when she goes to [place] .  Would be best if she connected with an EP there for the 2 year period she lives there in case there are any needs. But will have provider discuss a good plan when she comes in. She has 8 years on her battery life.

## 2023-08-28 NOTE — Progress Notes (Unsigned)
  Electrophysiology Office Note:   ID:  April, Lowery February 27, 1946, MRN [id number]  Primary Cardiologist: None Electrophysiologist: Manya Sells, MD  {Click to update primary MD,subspecialty MD or APP then REFRESH:1}    History of Present Illness:   April Lowery is a 78 y.o. female with h/o CHB, CHF, CAD s/p CABG, and HTN  seen today for routine electrophysiology followup.   Since last being seen in our clinic the patient reports doing ***.  she denies chest pain, palpitations, dyspnea, PND, orthopnea, nausea, vomiting, dizziness, syncope, edema, weight gain, or early satiety.   Review of systems complete and found to be negative unless listed in HPI.   EP Information / Studies Reviewed:    EKG is not ordered today. EKG from 04/21/2023 reviewed which showed AS-VP at 62 bpm       PPM Interrogation-  reviewed in detail today,  See PACEART report.  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    Physical Exam:   VS:  There were no vitals taken for this visit.   Wt Readings from Last 3 Encounters:  08/12/23 178 lb 6 oz (80.9 kg)  06/17/23 179 lb (81.2 kg)  04/21/23 174 lb 6.4 oz (79.1 kg)     GEN: No acute distress  NECK: No JVD; No carotid bruits CARDIAC: {EPRHYTHM:28826}, no murmurs, rubs, gallops RESPIRATORY:  Clear to auscultation without rales, wheezing or rhonchi  ABDOMEN: Soft, non-tender, non-distended EXTREMITIES:  {EDEMA LEVEL:28147::"No"} edema; No deformity   ASSESSMENT AND PLAN:    CHB s/p Medtronic PPM  Normal PPM function See Valeta Gaudier Art report No changes today  CAD s/p CABG 2008, stent 2019 No s/s of ischemia.      Chronic systolic CHF Echo 03/25/2023 EF 35% (down from 40-45%) Continue GDMT She is 99% VP with IVCD, would be a candidate for CRT in the future. Currently refuses  {Click here to Review PMH, Prob List, Meds, Allergies, SHx, FHx  :1}   Disposition:    Follow up with {EPPROVIDERS:28135::"EP Team"} {EPFOLLOW UP:28173}  Signed, Tylene Galla, PA-C

## 2023-08-29 ENCOUNTER — Encounter: Payer: Self-pay | Admitting: Student

## 2023-08-29 ENCOUNTER — Ambulatory Visit: Admitting: Student

## 2023-08-29 ENCOUNTER — Ambulatory Visit: Attending: Student | Admitting: Student

## 2023-08-29 VITALS — BP 102/48 | HR 84 | Ht 65.0 in | Wt 181.2 lb

## 2023-08-29 DIAGNOSIS — I442 Atrioventricular block, complete: Secondary | ICD-10-CM

## 2023-08-29 DIAGNOSIS — I255 Ischemic cardiomyopathy: Secondary | ICD-10-CM | POA: Diagnosis not present

## 2023-08-29 DIAGNOSIS — I5022 Chronic systolic (congestive) heart failure: Secondary | ICD-10-CM | POA: Diagnosis not present

## 2023-08-29 DIAGNOSIS — I10 Essential (primary) hypertension: Secondary | ICD-10-CM | POA: Diagnosis not present

## 2023-08-29 LAB — CUP PACEART INCLINIC DEVICE CHECK
Battery Remaining Longevity: 103 mo
Battery Voltage: 2.99 V
Brady Statistic AP VP Percent: 42.77 %
Brady Statistic AP VS Percent: 1.15 %
Brady Statistic AS VP Percent: 54.65 %
Brady Statistic AS VS Percent: 1.42 %
Brady Statistic RA Percent Paced: 43.94 %
Brady Statistic RV Percent Paced: 97.43 %
Date Time Interrogation Session: 20250606112640
Implantable Lead Connection Status: 753985
Implantable Lead Connection Status: 753985
Implantable Lead Implant Date: 20191121
Implantable Lead Implant Date: 20191121
Implantable Lead Location: 753859
Implantable Lead Location: 753860
Implantable Lead Model: 4076
Implantable Lead Model: 4076
Implantable Pulse Generator Implant Date: 20191121
Lead Channel Impedance Value: 361 Ohm
Lead Channel Impedance Value: 380 Ohm
Lead Channel Impedance Value: 475 Ohm
Lead Channel Impedance Value: 475 Ohm
Lead Channel Pacing Threshold Amplitude: 0.625 V
Lead Channel Pacing Threshold Amplitude: 1 V
Lead Channel Pacing Threshold Pulse Width: 0.4 ms
Lead Channel Pacing Threshold Pulse Width: 0.4 ms
Lead Channel Sensing Intrinsic Amplitude: 2.375 mV
Lead Channel Sensing Intrinsic Amplitude: 26.5 mV
Lead Channel Sensing Intrinsic Amplitude: 3.75 mV
Lead Channel Sensing Intrinsic Amplitude: 6.25 mV
Lead Channel Setting Pacing Amplitude: 1.5 V
Lead Channel Setting Pacing Amplitude: 2 V
Lead Channel Setting Pacing Pulse Width: 0.4 ms
Lead Channel Setting Sensing Sensitivity: 1.2 mV
Zone Setting Status: 755011
Zone Setting Status: 755011

## 2023-08-29 NOTE — Patient Instructions (Signed)
 Medication Instructions:  No medication changes today. *If you need a refill on your cardiac medications before your next appointment, please call your pharmacy*  Lab Work: No labwork ordered today. If you have labs (blood work) drawn today and your tests are completely normal, you will receive your results only by: MyChart Message (if you have MyChart) OR A paper copy in the mail If you have any lab test that is abnormal or we need to change your treatment, we will call you to review the results.  Testing/Procedures: No testing ordered today  Follow-Up: At [facility], you and your health needs are our priority.  As part of our continuing mission to provide you with exceptional heart care, our providers are all part of one team.  This team includes your primary Cardiologist (physician) and Advanced Practice Providers or APPs (Physician Assistants and Nurse Practitioners) who all work together to provide you with the care you need, when you need it.  Your next appointment:   As needed  Provider:   Mertha Abrahams, April Lowery April Lowery [address] [place], April Lowery April Doffing, NP

## 2023-09-03 ENCOUNTER — Ambulatory Visit (INDEPENDENT_AMBULATORY_CARE_PROVIDER_SITE_OTHER): Payer: Medicare HMO

## 2023-09-03 DIAGNOSIS — I255 Ischemic cardiomyopathy: Secondary | ICD-10-CM | POA: Diagnosis not present

## 2023-09-04 LAB — CUP PACEART REMOTE DEVICE CHECK
Battery Remaining Longevity: 102 mo
Battery Voltage: 2.98 V
Brady Statistic AP VP Percent: 55.69 %
Brady Statistic AP VS Percent: 0.09 %
Brady Statistic AS VP Percent: 44.01 %
Brady Statistic AS VS Percent: 0.21 %
Brady Statistic RA Percent Paced: 55.74 %
Brady Statistic RV Percent Paced: 99.7 %
Date Time Interrogation Session: 20250611063949
Implantable Lead Connection Status: 753985
Implantable Lead Connection Status: 753985
Implantable Lead Implant Date: 20191121
Implantable Lead Implant Date: 20191121
Implantable Lead Location: 753859
Implantable Lead Location: 753860
Implantable Lead Model: 4076
Implantable Lead Model: 4076
Implantable Pulse Generator Implant Date: 20191121
Lead Channel Impedance Value: 361 Ohm
Lead Channel Impedance Value: 361 Ohm
Lead Channel Impedance Value: 437 Ohm
Lead Channel Impedance Value: 437 Ohm
Lead Channel Pacing Threshold Amplitude: 0.625 V
Lead Channel Pacing Threshold Amplitude: 1 V
Lead Channel Pacing Threshold Pulse Width: 0.4 ms
Lead Channel Pacing Threshold Pulse Width: 0.4 ms
Lead Channel Sensing Intrinsic Amplitude: 2.375 mV
Lead Channel Sensing Intrinsic Amplitude: 2.375 mV
Lead Channel Sensing Intrinsic Amplitude: 26.5 mV
Lead Channel Sensing Intrinsic Amplitude: 6.25 mV
Lead Channel Setting Pacing Amplitude: 1.5 V
Lead Channel Setting Pacing Amplitude: 2 V
Lead Channel Setting Pacing Pulse Width: 0.4 ms
Lead Channel Setting Sensing Sensitivity: 1.2 mV
Zone Setting Status: 755011
Zone Setting Status: 755011

## 2023-09-07 ENCOUNTER — Ambulatory Visit: Payer: Self-pay | Admitting: Internal Medicine

## 2023-09-19 ENCOUNTER — Ambulatory Visit: Payer: Medicare HMO

## 2023-09-20 ENCOUNTER — Other Ambulatory Visit: Payer: Self-pay | Admitting: Family Medicine

## 2023-09-20 DIAGNOSIS — I5020 Unspecified systolic (congestive) heart failure: Secondary | ICD-10-CM

## 2023-10-20 ENCOUNTER — Encounter: Admitting: Student

## 2023-10-30 NOTE — Progress Notes (Signed)
 Remote pacemaker transmission.

## 2023-10-30 NOTE — Addendum Note (Signed)
 Addended by: VICCI SELLER A on: 10/30/2023 10:52 AM   Modules accepted: Orders

## 2023-11-25 DIAGNOSIS — Z951 Presence of aortocoronary bypass graft: Secondary | ICD-10-CM | POA: Diagnosis not present

## 2023-11-25 DIAGNOSIS — E1169 Type 2 diabetes mellitus with other specified complication: Secondary | ICD-10-CM | POA: Diagnosis not present

## 2023-11-25 DIAGNOSIS — Z0000 Encounter for general adult medical examination without abnormal findings: Secondary | ICD-10-CM | POA: Diagnosis not present

## 2023-11-25 DIAGNOSIS — I429 Cardiomyopathy, unspecified: Secondary | ICD-10-CM | POA: Diagnosis not present

## 2023-11-25 DIAGNOSIS — Z9181 History of falling: Secondary | ICD-10-CM | POA: Diagnosis not present

## 2023-11-25 DIAGNOSIS — I509 Heart failure, unspecified: Secondary | ICD-10-CM | POA: Diagnosis not present

## 2023-11-25 DIAGNOSIS — E785 Hyperlipidemia, unspecified: Secondary | ICD-10-CM | POA: Diagnosis not present

## 2023-11-25 DIAGNOSIS — M25511 Pain in right shoulder: Secondary | ICD-10-CM | POA: Diagnosis not present

## 2023-11-25 DIAGNOSIS — N183 Chronic kidney disease, stage 3 unspecified: Secondary | ICD-10-CM | POA: Diagnosis not present

## 2023-11-25 DIAGNOSIS — E1122 Type 2 diabetes mellitus with diabetic chronic kidney disease: Secondary | ICD-10-CM | POA: Diagnosis not present

## 2023-11-25 DIAGNOSIS — Z8673 Personal history of transient ischemic attack (TIA), and cerebral infarction without residual deficits: Secondary | ICD-10-CM | POA: Diagnosis not present

## 2023-11-25 DIAGNOSIS — Z950 Presence of cardiac pacemaker: Secondary | ICD-10-CM | POA: Diagnosis not present

## 2023-11-28 ENCOUNTER — Other Ambulatory Visit: Payer: Self-pay | Admitting: Family Medicine

## 2023-11-28 DIAGNOSIS — N1831 Chronic kidney disease, stage 3a: Secondary | ICD-10-CM

## 2023-11-28 DIAGNOSIS — E1169 Type 2 diabetes mellitus with other specified complication: Secondary | ICD-10-CM

## 2023-12-03 ENCOUNTER — Ambulatory Visit (INDEPENDENT_AMBULATORY_CARE_PROVIDER_SITE_OTHER): Payer: Medicare HMO

## 2023-12-03 DIAGNOSIS — I255 Ischemic cardiomyopathy: Secondary | ICD-10-CM | POA: Diagnosis not present

## 2023-12-03 LAB — CUP PACEART REMOTE DEVICE CHECK
Battery Remaining Longevity: 96 mo
Battery Voltage: 2.98 V
Brady Statistic AP VP Percent: 63.81 %
Brady Statistic AP VS Percent: 0.13 %
Brady Statistic AS VP Percent: 35.51 %
Brady Statistic AS VS Percent: 0.55 %
Brady Statistic RA Percent Paced: 64.05 %
Brady Statistic RV Percent Paced: 99.32 %
Date Time Interrogation Session: 20250910001351
Implantable Lead Connection Status: 753985
Implantable Lead Connection Status: 753985
Implantable Lead Implant Date: 20191121
Implantable Lead Implant Date: 20191121
Implantable Lead Location: 753859
Implantable Lead Location: 753860
Implantable Lead Model: 4076
Implantable Lead Model: 4076
Implantable Pulse Generator Implant Date: 20191121
Lead Channel Impedance Value: 342 Ohm
Lead Channel Impedance Value: 361 Ohm
Lead Channel Impedance Value: 418 Ohm
Lead Channel Impedance Value: 437 Ohm
Lead Channel Pacing Threshold Amplitude: 0.625 V
Lead Channel Pacing Threshold Amplitude: 0.875 V
Lead Channel Pacing Threshold Pulse Width: 0.4 ms
Lead Channel Pacing Threshold Pulse Width: 0.4 ms
Lead Channel Sensing Intrinsic Amplitude: 10 mV
Lead Channel Sensing Intrinsic Amplitude: 10 mV
Lead Channel Sensing Intrinsic Amplitude: 2.5 mV
Lead Channel Sensing Intrinsic Amplitude: 2.5 mV
Lead Channel Setting Pacing Amplitude: 1.5 V
Lead Channel Setting Pacing Amplitude: 2 V
Lead Channel Setting Pacing Pulse Width: 0.4 ms
Lead Channel Setting Sensing Sensitivity: 1.2 mV
Zone Setting Status: 755011
Zone Setting Status: 755011

## 2023-12-09 ENCOUNTER — Ambulatory Visit: Payer: Self-pay | Admitting: Internal Medicine

## 2023-12-10 DIAGNOSIS — I10 Essential (primary) hypertension: Secondary | ICD-10-CM | POA: Diagnosis not present

## 2023-12-10 DIAGNOSIS — Z87891 Personal history of nicotine dependence: Secondary | ICD-10-CM | POA: Diagnosis not present

## 2023-12-10 DIAGNOSIS — E1169 Type 2 diabetes mellitus with other specified complication: Secondary | ICD-10-CM | POA: Diagnosis not present

## 2023-12-10 DIAGNOSIS — M25511 Pain in right shoulder: Secondary | ICD-10-CM | POA: Diagnosis not present

## 2023-12-10 DIAGNOSIS — E119 Type 2 diabetes mellitus without complications: Secondary | ICD-10-CM | POA: Diagnosis not present

## 2023-12-10 DIAGNOSIS — E118 Type 2 diabetes mellitus with unspecified complications: Secondary | ICD-10-CM | POA: Diagnosis not present

## 2023-12-10 DIAGNOSIS — E785 Hyperlipidemia, unspecified: Secondary | ICD-10-CM | POA: Diagnosis not present

## 2023-12-10 DIAGNOSIS — N183 Chronic kidney disease, stage 3 unspecified: Secondary | ICD-10-CM | POA: Diagnosis not present

## 2023-12-10 DIAGNOSIS — N1831 Chronic kidney disease, stage 3a: Secondary | ICD-10-CM | POA: Diagnosis not present

## 2023-12-12 NOTE — Progress Notes (Signed)
 Remote PPM Transmission

## 2023-12-15 DIAGNOSIS — N1831 Chronic kidney disease, stage 3a: Secondary | ICD-10-CM | POA: Diagnosis not present

## 2023-12-15 DIAGNOSIS — Z905 Acquired absence of kidney: Secondary | ICD-10-CM | POA: Diagnosis not present

## 2023-12-15 DIAGNOSIS — I10 Essential (primary) hypertension: Secondary | ICD-10-CM | POA: Diagnosis not present

## 2023-12-15 DIAGNOSIS — Z87891 Personal history of nicotine dependence: Secondary | ICD-10-CM | POA: Diagnosis not present

## 2023-12-16 DIAGNOSIS — E1169 Type 2 diabetes mellitus with other specified complication: Secondary | ICD-10-CM | POA: Diagnosis not present

## 2023-12-16 DIAGNOSIS — Z950 Presence of cardiac pacemaker: Secondary | ICD-10-CM | POA: Diagnosis not present

## 2023-12-16 DIAGNOSIS — N183 Chronic kidney disease, stage 3 unspecified: Secondary | ICD-10-CM | POA: Diagnosis not present

## 2023-12-16 DIAGNOSIS — Z951 Presence of aortocoronary bypass graft: Secondary | ICD-10-CM | POA: Diagnosis not present

## 2023-12-16 DIAGNOSIS — I429 Cardiomyopathy, unspecified: Secondary | ICD-10-CM | POA: Diagnosis not present

## 2023-12-16 DIAGNOSIS — I509 Heart failure, unspecified: Secondary | ICD-10-CM | POA: Diagnosis not present

## 2023-12-16 DIAGNOSIS — E1122 Type 2 diabetes mellitus with diabetic chronic kidney disease: Secondary | ICD-10-CM | POA: Diagnosis not present

## 2023-12-16 DIAGNOSIS — N1831 Chronic kidney disease, stage 3a: Secondary | ICD-10-CM | POA: Diagnosis not present

## 2023-12-16 DIAGNOSIS — E785 Hyperlipidemia, unspecified: Secondary | ICD-10-CM | POA: Diagnosis not present

## 2023-12-16 DIAGNOSIS — Z7984 Long term (current) use of oral hypoglycemic drugs: Secondary | ICD-10-CM | POA: Diagnosis not present

## 2023-12-18 ENCOUNTER — Other Ambulatory Visit: Payer: Self-pay | Admitting: Family Medicine

## 2023-12-29 DIAGNOSIS — Z905 Acquired absence of kidney: Secondary | ICD-10-CM | POA: Diagnosis not present

## 2023-12-29 DIAGNOSIS — E119 Type 2 diabetes mellitus without complications: Secondary | ICD-10-CM | POA: Diagnosis not present

## 2023-12-29 DIAGNOSIS — N1832 Chronic kidney disease, stage 3b: Secondary | ICD-10-CM | POA: Diagnosis not present

## 2023-12-29 DIAGNOSIS — I10 Essential (primary) hypertension: Secondary | ICD-10-CM | POA: Diagnosis not present

## 2023-12-31 DIAGNOSIS — M791 Myalgia, unspecified site: Secondary | ICD-10-CM | POA: Diagnosis not present

## 2024-01-26 ENCOUNTER — Other Ambulatory Visit: Payer: Self-pay | Admitting: Student

## 2024-01-27 MED ORDER — SPIRONOLACTONE 25 MG PO TABS: 25.0000 mg | ORAL_TABLET | Freq: Every day | ORAL | 2 refills | Status: AC

## 2024-03-03 ENCOUNTER — Ambulatory Visit: Payer: Medicare HMO

## 2024-03-04 ENCOUNTER — Ambulatory Visit: Payer: Self-pay | Admitting: Internal Medicine

## 2024-03-04 LAB — CUP PACEART REMOTE DEVICE CHECK
Battery Remaining Longevity: 91 mo
Battery Voltage: 2.98 V
Brady Statistic AP VP Percent: 65.31 %
Brady Statistic AP VS Percent: 0.32 %
Brady Statistic AS VP Percent: 33.93 %
Brady Statistic AS VS Percent: 0.45 %
Brady Statistic RA Percent Paced: 65.72 %
Brady Statistic RV Percent Paced: 99.24 %
Date Time Interrogation Session: 20251210014910
Implantable Lead Connection Status: 753985
Implantable Lead Connection Status: 753985
Implantable Lead Implant Date: 20191121
Implantable Lead Implant Date: 20191121
Implantable Lead Location: 753859
Implantable Lead Location: 753860
Implantable Lead Model: 4076
Implantable Lead Model: 4076
Implantable Pulse Generator Implant Date: 20191121
Lead Channel Impedance Value: 342 Ohm
Lead Channel Impedance Value: 361 Ohm
Lead Channel Impedance Value: 418 Ohm
Lead Channel Impedance Value: 418 Ohm
Lead Channel Pacing Threshold Amplitude: 0.625 V
Lead Channel Pacing Threshold Amplitude: 0.875 V
Lead Channel Pacing Threshold Pulse Width: 0.4 ms
Lead Channel Pacing Threshold Pulse Width: 0.4 ms
Lead Channel Sensing Intrinsic Amplitude: 19.75 mV
Lead Channel Sensing Intrinsic Amplitude: 19.75 mV
Lead Channel Sensing Intrinsic Amplitude: 2.5 mV
Lead Channel Sensing Intrinsic Amplitude: 2.5 mV
Lead Channel Setting Pacing Amplitude: 1.5 V
Lead Channel Setting Pacing Amplitude: 2 V
Lead Channel Setting Pacing Pulse Width: 0.4 ms
Lead Channel Setting Sensing Sensitivity: 1.2 mV
Zone Setting Status: 755011
Zone Setting Status: 755011

## 2024-03-10 NOTE — Progress Notes (Signed)
 Remote PPM Transmission

## 2024-03-16 ENCOUNTER — Other Ambulatory Visit: Payer: Self-pay | Admitting: Family Medicine

## 2024-03-16 DIAGNOSIS — F419 Anxiety disorder, unspecified: Secondary | ICD-10-CM
